# Patient Record
Sex: Female | Born: 1955 | Race: White | Hispanic: No | Marital: Married | Smoking: Never smoker
Health system: Southern US, Community
[De-identification: ages and names within clinical notes are randomized; demographics above are authoritative.]

## PROBLEM LIST (undated history)

## (undated) DIAGNOSIS — K219 Gastro-esophageal reflux disease without esophagitis: Secondary | ICD-10-CM

## (undated) DIAGNOSIS — R590 Localized enlarged lymph nodes: Secondary | ICD-10-CM

## (undated) DIAGNOSIS — G2581 Restless legs syndrome: Secondary | ICD-10-CM

## (undated) DIAGNOSIS — I10 Essential (primary) hypertension: Secondary | ICD-10-CM

## (undated) DIAGNOSIS — K449 Diaphragmatic hernia without obstruction or gangrene: Secondary | ICD-10-CM

## (undated) DIAGNOSIS — D649 Anemia, unspecified: Secondary | ICD-10-CM

## (undated) DIAGNOSIS — R51 Headache: Secondary | ICD-10-CM

## (undated) DIAGNOSIS — M1990 Unspecified osteoarthritis, unspecified site: Secondary | ICD-10-CM

## (undated) DIAGNOSIS — G4730 Sleep apnea, unspecified: Secondary | ICD-10-CM

## (undated) DIAGNOSIS — E785 Hyperlipidemia, unspecified: Secondary | ICD-10-CM

## (undated) HISTORY — PX: TUMOR REMOVAL: SHX12

## (undated) HISTORY — DX: Unspecified osteoarthritis, unspecified site: M19.90

## (undated) HISTORY — PX: TONSILLECTOMY: SUR1361

## (undated) HISTORY — DX: Essential (primary) hypertension: I10

## (undated) HISTORY — DX: Diaphragmatic hernia without obstruction or gangrene: K44.9

## (undated) HISTORY — PX: ENDOMETRIAL BIOPSY: SHX622

## (undated) HISTORY — DX: Hyperlipidemia, unspecified: E78.5

## (undated) HISTORY — DX: Restless legs syndrome: G25.81

## (undated) HISTORY — PX: FINGER SURGERY: SHX640

## (undated) HISTORY — DX: Sleep apnea, unspecified: G47.30

---

## 1998-01-03 ENCOUNTER — Other Ambulatory Visit: Admission: RE | Admit: 1998-01-03 | Discharge: 1998-01-03 | Payer: Self-pay | Admitting: Obstetrics and Gynecology

## 1998-05-18 ENCOUNTER — Other Ambulatory Visit: Admission: RE | Admit: 1998-05-18 | Discharge: 1998-05-18 | Payer: Self-pay | Admitting: Obstetrics and Gynecology

## 1998-08-11 ENCOUNTER — Other Ambulatory Visit: Admission: RE | Admit: 1998-08-11 | Discharge: 1998-08-11 | Payer: Self-pay | Admitting: Obstetrics and Gynecology

## 1999-02-23 ENCOUNTER — Other Ambulatory Visit: Admission: RE | Admit: 1999-02-23 | Discharge: 1999-02-23 | Payer: Self-pay | Admitting: Obstetrics and Gynecology

## 2000-01-03 ENCOUNTER — Encounter (INDEPENDENT_AMBULATORY_CARE_PROVIDER_SITE_OTHER): Payer: Self-pay

## 2000-01-03 ENCOUNTER — Other Ambulatory Visit: Admission: RE | Admit: 2000-01-03 | Discharge: 2000-01-03 | Payer: Self-pay | Admitting: Obstetrics and Gynecology

## 2000-02-21 ENCOUNTER — Other Ambulatory Visit: Admission: RE | Admit: 2000-02-21 | Discharge: 2000-02-21 | Payer: Self-pay | Admitting: Obstetrics and Gynecology

## 2001-02-07 ENCOUNTER — Encounter: Payer: Self-pay | Admitting: Obstetrics and Gynecology

## 2001-02-07 ENCOUNTER — Encounter: Admission: RE | Admit: 2001-02-07 | Discharge: 2001-02-07 | Payer: Self-pay | Admitting: Obstetrics and Gynecology

## 2001-02-21 ENCOUNTER — Other Ambulatory Visit: Admission: RE | Admit: 2001-02-21 | Discharge: 2001-02-21 | Payer: Self-pay | Admitting: Obstetrics and Gynecology

## 2002-03-25 ENCOUNTER — Other Ambulatory Visit: Admission: RE | Admit: 2002-03-25 | Discharge: 2002-03-25 | Payer: Self-pay | Admitting: Obstetrics and Gynecology

## 2002-07-31 ENCOUNTER — Encounter: Admission: RE | Admit: 2002-07-31 | Discharge: 2002-07-31 | Payer: Self-pay | Admitting: Obstetrics and Gynecology

## 2002-07-31 ENCOUNTER — Encounter: Payer: Self-pay | Admitting: Obstetrics and Gynecology

## 2003-08-02 ENCOUNTER — Encounter: Admission: RE | Admit: 2003-08-02 | Discharge: 2003-08-02 | Payer: Self-pay | Admitting: Obstetrics and Gynecology

## 2003-08-02 ENCOUNTER — Encounter: Payer: Self-pay | Admitting: Obstetrics and Gynecology

## 2003-09-21 ENCOUNTER — Encounter: Payer: Self-pay | Admitting: Obstetrics and Gynecology

## 2003-09-22 ENCOUNTER — Ambulatory Visit (HOSPITAL_COMMUNITY): Admission: RE | Admit: 2003-09-22 | Discharge: 2003-09-22 | Payer: Self-pay | Admitting: Obstetrics and Gynecology

## 2003-09-22 ENCOUNTER — Encounter (INDEPENDENT_AMBULATORY_CARE_PROVIDER_SITE_OTHER): Payer: Self-pay | Admitting: Specialist

## 2004-06-20 ENCOUNTER — Emergency Department (HOSPITAL_COMMUNITY): Admission: EM | Admit: 2004-06-20 | Discharge: 2004-06-20 | Payer: Self-pay | Admitting: Emergency Medicine

## 2004-08-18 ENCOUNTER — Ambulatory Visit (HOSPITAL_COMMUNITY): Admission: RE | Admit: 2004-08-18 | Discharge: 2004-08-18 | Payer: Self-pay | Admitting: Obstetrics and Gynecology

## 2005-12-11 ENCOUNTER — Ambulatory Visit (HOSPITAL_COMMUNITY): Admission: RE | Admit: 2005-12-11 | Discharge: 2005-12-11 | Payer: Self-pay | Admitting: Obstetrics and Gynecology

## 2007-01-30 ENCOUNTER — Ambulatory Visit (HOSPITAL_COMMUNITY): Admission: RE | Admit: 2007-01-30 | Discharge: 2007-01-30 | Payer: Self-pay | Admitting: Obstetrics and Gynecology

## 2008-02-12 ENCOUNTER — Ambulatory Visit (HOSPITAL_COMMUNITY): Admission: RE | Admit: 2008-02-12 | Discharge: 2008-02-12 | Payer: Self-pay | Admitting: Obstetrics and Gynecology

## 2008-07-08 ENCOUNTER — Ambulatory Visit (HOSPITAL_BASED_OUTPATIENT_CLINIC_OR_DEPARTMENT_OTHER): Admission: RE | Admit: 2008-07-08 | Discharge: 2008-07-08 | Payer: Self-pay | Admitting: Orthopedic Surgery

## 2008-09-07 ENCOUNTER — Ambulatory Visit (HOSPITAL_BASED_OUTPATIENT_CLINIC_OR_DEPARTMENT_OTHER): Admission: RE | Admit: 2008-09-07 | Discharge: 2008-09-07 | Payer: Self-pay | Admitting: Orthopedic Surgery

## 2009-02-21 ENCOUNTER — Encounter: Admission: RE | Admit: 2009-02-21 | Discharge: 2009-02-21 | Payer: Self-pay | Admitting: Family Medicine

## 2009-03-11 ENCOUNTER — Encounter (INDEPENDENT_AMBULATORY_CARE_PROVIDER_SITE_OTHER): Payer: Self-pay | Admitting: Gastroenterology

## 2009-03-11 ENCOUNTER — Ambulatory Visit (HOSPITAL_COMMUNITY): Admission: RE | Admit: 2009-03-11 | Discharge: 2009-03-11 | Payer: Self-pay | Admitting: Gastroenterology

## 2009-03-18 ENCOUNTER — Ambulatory Visit (HOSPITAL_COMMUNITY): Admission: RE | Admit: 2009-03-18 | Discharge: 2009-03-18 | Payer: Self-pay | Admitting: Obstetrics and Gynecology

## 2009-03-22 ENCOUNTER — Encounter (INDEPENDENT_AMBULATORY_CARE_PROVIDER_SITE_OTHER): Payer: Self-pay | Admitting: Gastroenterology

## 2009-03-22 ENCOUNTER — Ambulatory Visit (HOSPITAL_COMMUNITY): Admission: RE | Admit: 2009-03-22 | Discharge: 2009-03-22 | Payer: Self-pay | Admitting: Gastroenterology

## 2009-03-25 ENCOUNTER — Encounter: Admission: RE | Admit: 2009-03-25 | Discharge: 2009-03-25 | Payer: Self-pay | Admitting: Obstetrics and Gynecology

## 2009-04-29 ENCOUNTER — Inpatient Hospital Stay (HOSPITAL_COMMUNITY): Admission: RE | Admit: 2009-04-29 | Discharge: 2009-05-03 | Payer: Self-pay | Admitting: General Surgery

## 2009-04-29 ENCOUNTER — Encounter (INDEPENDENT_AMBULATORY_CARE_PROVIDER_SITE_OTHER): Payer: Self-pay | Admitting: General Surgery

## 2010-04-03 ENCOUNTER — Ambulatory Visit (HOSPITAL_COMMUNITY): Admission: RE | Admit: 2010-04-03 | Discharge: 2010-04-03 | Payer: Self-pay | Admitting: Obstetrics and Gynecology

## 2010-12-24 ENCOUNTER — Encounter: Payer: Self-pay | Admitting: Obstetrics and Gynecology

## 2011-02-19 ENCOUNTER — Institutional Professional Consult (permissible substitution): Payer: Self-pay | Admitting: Cardiology

## 2011-03-06 ENCOUNTER — Institutional Professional Consult (permissible substitution): Payer: Self-pay | Admitting: Cardiology

## 2011-03-13 LAB — CBC
HCT: 34.6 % — ABNORMAL LOW (ref 36.0–46.0)
HCT: 39.3 % (ref 36.0–46.0)
Hemoglobin: 11.5 g/dL — ABNORMAL LOW (ref 12.0–15.0)
Hemoglobin: 13.2 g/dL (ref 12.0–15.0)
MCHC: 33.3 g/dL (ref 30.0–36.0)
MCHC: 33.6 g/dL (ref 30.0–36.0)
MCV: 86.8 fL (ref 78.0–100.0)
MCV: 87.8 fL (ref 78.0–100.0)
Platelets: 269 10*3/uL (ref 150–400)
Platelets: 288 10*3/uL (ref 150–400)
RBC: 3.94 MIL/uL (ref 3.87–5.11)
RBC: 4.52 MIL/uL (ref 3.87–5.11)
RDW: 12.7 % (ref 11.5–15.5)
RDW: 13.4 % (ref 11.5–15.5)
WBC: 10.8 10*3/uL — ABNORMAL HIGH (ref 4.0–10.5)
WBC: 7.9 10*3/uL (ref 4.0–10.5)

## 2011-03-13 LAB — BASIC METABOLIC PANEL
BUN: 5 mg/dL — ABNORMAL LOW (ref 6–23)
CO2: 30 mEq/L (ref 19–32)
Calcium: 8.7 mg/dL (ref 8.4–10.5)
Chloride: 105 mEq/L (ref 96–112)
Creatinine, Ser: 0.54 mg/dL (ref 0.4–1.2)
GFR calc Af Amer: >60 mL/min (ref >60)
GFR calc non Af Amer: >60 mL/min (ref >60)
Glucose, Bld: 133 mg/dL — ABNORMAL HIGH (ref 70–99)
Potassium: 4.2 mEq/L (ref 3.5–5.1)
Sodium: 140 mEq/L (ref 135–145)

## 2011-03-13 LAB — DIFFERENTIAL
Basophils Absolute: 0 10*3/uL (ref 0.0–0.1)
Basophils Relative: 0 % (ref 0–1)
Eosinophils Absolute: 0.3 10*3/uL (ref 0.0–0.7)
Eosinophils Relative: 4 % (ref 0–5)
Lymphocytes Relative: 28 % (ref 12–46)
Lymphs Abs: 2.2 10*3/uL (ref 0.7–4.0)
Monocytes Absolute: 0.6 10*3/uL (ref 0.1–1.0)
Monocytes Relative: 8 % (ref 3–12)
Neutro Abs: 4.7 10*3/uL (ref 1.7–7.7)
Neutrophils Relative %: 60 % (ref 43–77)

## 2011-03-13 LAB — COMPREHENSIVE METABOLIC PANEL
ALT: 63 U/L — ABNORMAL HIGH (ref 0–35)
AST: 47 U/L — ABNORMAL HIGH (ref 0–37)
Albumin: 3.9 g/dL (ref 3.5–5.2)
Alkaline Phosphatase: 118 U/L — ABNORMAL HIGH (ref 39–117)
BUN: 10 mg/dL (ref 6–23)
CO2: 30 mEq/L (ref 19–32)
Calcium: 9.7 mg/dL (ref 8.4–10.5)
Chloride: 103 mEq/L (ref 96–112)
Creatinine, Ser: 0.71 mg/dL (ref 0.4–1.2)
GFR calc Af Amer: >60 mL/min (ref >60)
GFR calc non Af Amer: >60 mL/min (ref >60)
Glucose, Bld: 143 mg/dL — ABNORMAL HIGH (ref 70–99)
Potassium: 4.3 mEq/L (ref 3.5–5.1)
Sodium: 141 mEq/L (ref 135–145)
Total Bilirubin: 0.7 mg/dL (ref 0.3–1.2)
Total Protein: 6.8 g/dL (ref 6.0–8.3)

## 2011-03-13 LAB — TYPE AND SCREEN
ABO/RH(D): B POS
Antibody Screen: NEGATIVE

## 2011-03-13 LAB — ABO/RH: ABO/RH(D): B POS

## 2011-04-17 NOTE — Op Note (Signed)
NAME:  Alison [name], Alison [name]                ACCOUNT NO.:  [id number]   MEDICAL RECORD NO.:  [id number]          PATIENT TYPE:  AMB   LOCATION:  DSC                          FACILITY:  [facility]   PHYSICIAN:  Katy Fitch. Sypher, M.D. DATE OF BIRTH:  28-Jan-1956   DATE OF PROCEDURE:  09/07/2008  DATE OF DISCHARGE:                               OPERATIVE REPORT   PREOPERATIVE DIAGNOSES:  Complex post-traumatic dystrophy, right small  finger, status post small finger proximal phalanx fracture sustained,  July 04, 2008, status post closed reduction percutaneous Kirschner wire  fixation utilizing intramedullary Kirschner wires, complicated by  regional pain syndrome and severe Peritendinous adhesions between the  extensor mechanism of the proximal and middle phalanges and the flexor  mechanism and the volar aspect of the metacarpal and proximal phalanges,  also significant right small finger proximal interphalangeal capsular  contracture.   OPERATION:  1. Tenolysis of extensor mechanism from metacarpophalangeal joint      distally to distal interphalangeal joint.  2. Tenolysis of flexor digitorum profundus and superficialis tendons,      right small finger over distance from lumbrical origin distally to      C3 pulley.  3. Proximal interphalangeal capsulectomy, right small finger.   OPERATING SURGEON:  Katy Fitch. Sypher, MD   ASSISTANT:  Marveen Reeks Dasnoit, PA-C   ANESTHESIA:  Right arm IV regional and proximal brachial level  supplemented by 0.25% Marcaine digital block at metacarpal head level.   SUPERVISING ANESTHESIOLOGIST:  Janetta Hora. 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   She was subsequently referred for Hand Surgery consult in early August.  At that time, she was advised of the complexity of what seems to be a  simple metaphyseal fracture of  proximal phalanx.  Given the interplay  between the extrinsic flexor and extensor tendons and the intrinsic  musculature, these fractures are quite prone to severe angulation,  shortening, and the development of a dystrophic finger.   We advised that in our judgment the best means of managing this was  closed reduction and intramedullary Kirschner wire fixation in an effort  to obtain and maintain an anatomic reduction of the proximal phalanx,  followed by tenolysis of the extensor and flexor tendon at a later date.   There was a small chance that she would be able to avoid a secondary  procedure, although it is out typical experience that these become  extremely stiff.   She was taken to the operating room on July 08, 2008 and underwent  closed reduction and percutaneous fixation with wedged Kirschner wires  that did not pass through the metacarpal.  We were able to maintain a  reduction for 3 weeks and removed the pins.  During her period of  postoperative care, she was able to elevate her hand and was able to  perform some passive exercise.   Shortly after the pins removed, she began to experience syndrome with  swelling and pericapsular  rubor of the PIP and DIP joints consistent  with a dystrophic finger.   For reasons that remain unexplained, this is a common occurrence with  this fracture.   She was started in vigorous therapy program under the supervision of  Wells Guiles OTR/CHT and has been performing exercises  appropriately.  She has regained full flexor range of motion of her  index, long, and ring fingers, but has had complete stiffness of the PIP  and DIP joint of the small finger in extension and only 0-45 degrees of  active motion of the small finger MP joint.   Once fracture healing was assured, we recommended she proceed with  tenolysis.  She is brought to the operating room at this time.   PROCEDURE:  [name] is brought to the operating room and  placed in  supine position on the operating table.   Following an Anesthesia consult with Dr. Gelene Mink preoperatively, IV  regional block at proximal brachial level was recommended and accepted.  We also advised that we anticipated procedure with a digital block once  the tenolysis and capsulectomy was completed so that we may demonstrate  active motion to Ms. Durrett in the operating room.  She did bring her  glasses to the OR so that she would be able to visualize her hand.   She was brought to room 2 of the [facility], placed in supine  position on the operating table, and under Dr. Thornton Dales supervision,  an IV regional block placed without complication.  Within 10 minutes, we  had excellent anesthesia.   The right arm was prepped with Betadine soap solution and sterilely  draped.  The procedure commenced with 2 dorsal mid lateral incisions  near the PIP joint which allowed exposure of the extensor mechanism.  The transverse retinacular fibers were identified and released.  The  lateral bands and central slip were completely tenolysed off the dorsum  of the proximal phalanx with a Glorious Peach and specialized tenolysis knives  designed by Dr. Eustaquio Boyden.  Complete tenolysis of the extensor was  accomplished.  The PIP capsule was then exposed dorsal to the collateral  ligaments and the capsule resected.  Thereafter, passive flexion of the  PIP joint to 100 degrees was accomplished.  The distal lateral bands  were then tenolysed with an Engineering geologist Freer-type tool and DIP motion  0 to 30 was achieved.  A formal capsulotomy of the DIP joint was not  undertaken.   We then explored the flexor side through a transverse incision in the  distal palmar crease.  Ms. Jolley had already begun forming significant  palmar fibromatosis causing pitting of the skin and marked tethering of  the A1 pulley to the pretendinous fibers.  These pathologic areas of  fibromatosis were resected followed by  windowing of the flexor sheath  proximal to the A1 pulley.  With great care using scissors, a [employer] tenolysis knives, and various size Freers, we are able to lyse the  flexor tendons of adhesions between the superficialis decussation and  the profundus tendon as well as the adhesions between the proximal  phalanx and the dorsal aspect of the profundus tendon.  The tendon was  also noted to be adherent to the flexor retinaculum proximal to the A1  pulley extending over the metacarpal shaft.   This may be consequent to bleeding in the flexor sheath following the  initial fracture experienced.   With a Allis clamp, a traction tenolysis was  performed with immediate  recovery of passive motion of the MP joint 0-85, the PIP joint 0-100,  and the PIP joint 0-30.   Ms. Heminger was then awakened from sedation and was asked to flex her  fingers.  With the wrist in 40 degrees extension, was able to flexor  fingertip to within 1 cm of the distal palmar crease.  She could  actively flex the PIP joint 95 degrees and the PIP joint 30 degrees.   Her wounds were repaired with intradermal 3-0 Prolene in the palmar  incision and intradermal 4-0 Prolene dorsally.  Steri-Strips were  applied.  After hemostasis was achieved by direct compression, she was  placed in a sterile hand dressing with a volar plaster splint  maintaining the wrist in 40 degrees dorsiflexion and the MP and PIP  joint free.  She was able to demonstrate continued active range of  motion in her dressing.  The dressing was finished with an Ace wrap and  Coban.   There were no apparent complications.   For aftercare, Ms. Hogans is provided Percocet 5 mg 1 p.o. q.4-6 h p.r.n.  pain, 30 tablets without refill.  Also, Keflex 500 mg once p.o. q.8 h x4  days as a prophylactic antibiotic.   She will be discharged to the care of her husband with a request to  return to our office for followup in 24 hours to initiate a therapy   program.      Katy Fitch. Sypher, M.D.  Electronically Signed     RVS/MEDQ  D:  09/07/2008  T:  09/08/2008  Job:  [id number]

## 2011-04-17 NOTE — Op Note (Signed)
NAME:  KOLEEN, CELIA NO.:  [id number]   MEDICAL RECORD NO.:  [id number]          PATIENT TYPE:  INP   LOCATION:  0003                         [facility]AC[facility]   PHYSICIAN:  Juanetta Gosling, MDDATE OF BIRTH:  1956-07-12   DATE OF PROCEDURE:  04/29/2009  DATE OF DISCHARGE:                               OPERATIVE REPORT   PREOPERATIVE DIAGNOSIS:  Gastric mass.   POSTOPERATIVE DIAGNOSIS:  Gastric mass.   PROCEDURE:  1. Laparoscopic gastric wedge resection.  2. Esophagogastroduodenoscopy.   SURGEON:  Troy Sine. Dwain Sarna, M.D.   ASSISTANT:  Ollen Gross. Vernell Morgans, M.D.   ANESTHESIA:  General.   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  Jimmy Picket   SPECIMENS:  Gastric mass to pathology.   DRAINS:  A 19-French Blake drain to staple line.   ESTIMATED BLOOD LOSS:  Minimal.   COMPLICATIONS:  None.   DISPOSITION:  To recovery room in stable condition.   INDICATIONS:  This is a 55 year old female who was evaluated for  atypical chest pain by Dr. Kipp Brood Burnett's office.  She had a normal EKG  and underwent a CT angio for a pulmonary embolism that was negative.  Incidentally, this was noted to be a 2.5 mm x 2.5 cm exophytic mass in  the stomach. The patient was referred to Dr. Jeani Hawking for  evaluation.  She underwent an EGD and a [place], that showed a 2.5 cm x 2.5  cm fundic submucosal mass.  The biopsy results showed what looked like  to be spindle cell proliferation, consistent with a gastrointestinal  stromal tumor.  She is otherwise healthy, and on her exam was benign.  I  counseled her for a laparoscopic and possible open gastric wedge  resection or a gastrectomy, depending upon where this area was.   DESCRIPTION OF PROCEDURE:  After an informed consent was obtained, the  patient was taken to the operating room.  She was administered 1 gram of  intravenous cefazolin.  Sequential compression devices were placed on  her  lower extremities.  She underwent general endotracheal anesthesia  without complication.  She was on a split leg table for access during  the procedure.  Her abdomen was then prepped and draped in the standard  sterile surgical fashion.  A surgical time-out was then performed.   A local anesthetic was infiltrated above her umbilicus with 1% lidocaine  mixed with 0.25% Marcaine. A 10-mm vertical incision was then made above  her umbilicus and dissection was carried out down to the level of her  fascia.  This was incised sharply.  A #0 Vicryl pursestring suture was  then placed through her fascia and a Hassan trocar was introduced into  the abdomen.  Her abdomen was then insufflated to 15 mmHg pressure.  She  was then placed in the steep reverse Trendelenburg position.  Two  further trocars were placed in the midclavicular line.  They were 5 mm  in size.  A further 5 mm trocar was placed after infiltration with  local anesthetic in the left anterior axillary  line.  An incision was  also made in the epigastrium and then a liver retractor was then  inserted.  Under direct vision the liver was then retracted without  difficulty.  She was noted to have about a 2 cm mass on the greater  curvature of her stomach, in the region of her fundus.  I used the  harmonic scalpel to release her short gastric arteries and to mobilize  the greater curvature of her stomach, up towards her spleen.  There was  a small amount of bleeding that was present, that was controlled with  clips and the harmonic scalpel.  I did place a Ray-Tec sponge in there  during the procedure as well, but all this was hemostatic.  Once I had  the area with the mass rotated up, I performed an EGD to identify  intraluminally where this mass was.  There was a smaller component of  this actually inside the stomach and it was exactly where it appeared to  be on the outside, as well.  It was at least 4 cm from the GE junction.  I removed  the EGD. I then placed a 40-French bougie in through the GE  junction, into the stomach, to ensure that we did not damage this during  the procedure.  I then used an Echelon stapler with blue loads to divide  the stomach, as well as the tumor that was present.  This took three  fires of an Energy manager.  The staple line was hemostatic upon  completion.  The stomach and tumor was then placed in an EndoCatch bag  and removed out of the umbilicus.  This was sent to pathology for a  frozen section, where this was noted to have margins negative, as well  as appeared to be on frozen, consistent with a gastrointestinal stromal  tumor.  Following this I then removed the Ray-Tec sponge.  Hemostasis  was observed at this point.  I did place two clips on the staple line of  the stomach, but otherwise this appeared very good.  I then removed the  bougie and placed the EGD again and tested this under water, with no  evidence of any leak.  I did place Tisseel over the staple line, as well  as placing a 19-French Blake drain through one of the laparoscopic ports  next to this as well.  Following this, I used the #0 Vicryl and  Endoclose, to close the 10/12 port, that I had up-sized to place the  stapler through.  I then secured the drain and then put an additional  pursestring suture into the umbilical incision and then closed this.  The remainder of the 5 mm trocars were removed, after the abdomen was  desufflated, as well as the Nathanson retractor under direct vision.  All the incisions were then closed with #4-0 Monocryl and Dermabond.   She tolerated this well and was extubated in the operating room and  transferred to the recovery room in stable condition.      Juanetta Gosling, MD  Electronically Signed     MCW/MEDQ  D:  04/29/2009  T:  04/29/2009  Job:  [id number]   cc:   Jordan Hawks. Elnoria Howard, MD  Fax: [phone]   Marjory Lies, M.D.  Fax: [phone]

## 2011-04-17 NOTE — Op Note (Signed)
NAME:  [name], [name]                ACCOUNT NO.:  [id number]   MEDICAL RECORD NO.:  [id number]          PATIENT TYPE:  AMB   LOCATION:  DSC                          FACILITY:  [facility]   PHYSICIAN:  Katy Fitch. Sypher, M.D. DATE OF BIRTH:  04-19-1956   DATE OF PROCEDURE:  07/08/2008  DATE OF DISCHARGE:                               OPERATIVE REPORT   PREOPERATIVE DIAGNOSIS:  Comminuted fracture, right small finger  proximal phalanx, proximal metaphysis with angulation.   POSTOPERATIVE DIAGNOSIS:  Comminuted fracture, right small finger  proximal phalanx, proximal metaphysis with angulation.   OPERATIONS:  Closed reduction and percutaneous intramedullary Kirschner  wire fixation of right small finger proximal phalanx x2.   OPERATING SURGEON:  Katy Fitch. Sypher, MD.   ASSISTANT:  Marveen Reeks Dasnoit, PA   ANESTHESIA:  General by LMA.   SUPERVISING ANESTHESIOLOGIST:  Quita Skye. 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   Clinical examination revealed ecchymosis and minor abrasions from the  fall creating the fracture.   Preoperative x-rays revealed comminution of her proximal metaphysis.   We had a lengthy informed consent, pointing out that this fracture could  be extremely challenging with stiffness and shortening of the proximal  phalanx.  We advised her to proceed with closed reduction and  percutaneous pinning.  She understands the chance we may need to perform  tenolysis at a later date.   Preoperatively, questions were invited and answered in detail for Ms.  Hair.   PROCEDURE:  [name] is brought to operating room and placed in  supine position on the operating table.   Following induction of general anesthesia by LMA technique, the right  arm was prepped with Betadine soap solution and sterilely draped.  Following  exsanguination of right arm with Esmarch bandage, arterial  tourniquet was inflated to 230 mmHg.  Procedure commenced with closed  reduction of the fracture with traction and a 90:90 position of the MP  and PIP joints.  A 0.035-inch Kirschner wire was placed into the ulnar  base of the proximal phalanx with hand technique and driven with a  Kirschner wire driver down the intramedullary canal.   An anatomic alignment of the fracture was obtained.   A second Kirschner wire was then threaded to the base of the radial  condyle of the proximal phalanx by hand and subsequently advanced with a  K-wire driver to the distal metaphysis.   AP, lateral, and multiple oblique C-arm images were obtained confirming  the intramedullary position of the Kirschner wires.  We are able to  maintain the length of the fracture.   By driving the wires distally into the diaphyseal cortex, we may be able  to obtain enough purchase to prevent collapse.   The pins were bent in the usual manner, dressed with Xeroflo, sterile  gauze, and a ulnar gutter splint.  There were no apparent complications.   For aftercare, [name] is advised to elevate her  hand for next the 7  days.  She will work on thumb, index, and long finger motion.  We will  see her in the office in 7 days for check x-ray and advancement to a  thermoplastic splint.      Katy Fitch Sypher, M.D.  Electronically Signed     RVS/MEDQ  D:  07/08/2008  T:  07/09/2008  Job:  [id number]

## 2011-04-20 NOTE — Op Note (Signed)
NAME:  Alison, [name] NO.:  [id number]   MEDICAL RECORD NO.:  [id number]                   PATIENT TYPE:  AMB   LOCATION:  DAY                                  FACILITY:  [facility]   PHYSICIAN:  Malachi Pro. Ambrose Mantle, M.D.              DATE OF BIRTH:  21-Sep-1956   DATE OF PROCEDURE:  09/22/2003  DATE OF DISCHARGE:                                 OPERATIVE REPORT   PREOPERATIVE DIAGNOSIS:  Abnormal uterine bleeding.   POSTOPERATIVE DIAGNOSIS:  Abnormal uterine bleeding with a large endometrial  polyp.   OPERATION:  Dilatation and curettage, hysteroscopy, removal of endometrial  polyp.   OPERATOR:  Malachi Pro. Ambrose Mantle, M.D.   General anesthesia.   The patient was brought to the operating room and placed under satisfactory  general anesthesia and placed in the [facility] stirrups and placed in lithotomy  position.  Exam revealed the uterus to be midplane, normal size, the adnexa  were free of masses.  The cervix was drawn into the operative field after  the area was prepped with Betadine solution and draped as a sterile field.  The uterus sounded to 8 cm in the midplane.  The cervical canal was then  dilated progressively with Shawnie Pons dilators until it easily accepted the  hysteroscopic trocar and sleeve.  In visualizing the endometrial cavity, it  was apparent that she had a symmetrical golf ball-shaped polyp at the top of  the fundus.  It was difficult to tell whether it was a polyp or a fibroid,  so I removed the hysteroscope and used the polyp forceps to extract fairly  significantly sized pieces of tissue.  Re-look with the hysteroscope, still  there was some polyp remaining.  I resected more with the polyp forceps and  the uterine dressing forceps, looked again.  There was only a small amount  of tissue remaining.  I removed this with the biopsy forceps.  I then did a  D&C, curetting the endometrial cavity and the endocervical canal, and the  procedure was  terminated.  There were no other abnormalities noted except  the large endometrial polyp.  Blood loss was less than 10 mL.  The patient  was returned to recovery in satisfactory condition.  There was 3000 mL of  sorbitol to start with.  There was 1000 left in the bag at the end of the  procedure, 70 mL were necessary to prime the pump.  We collected 1500 mL in  the suction, there was approximately 100 mL in a basin, and then there was  an undecipherable amount on the floor.  At worst, the patient had a deficit  of 400 mL, which she should manage without any problem.  Malachi Pro. Ambrose Mantle, M.D.    TFH/MEDQ  D:  09/22/2003  T:  09/22/2003  Job:  [id number]

## 2011-04-20 NOTE — Discharge Summary (Signed)
NAME:  Alison [name], Alison [name] NO.:  [id number]   MEDICAL RECORD NO.:  [id number]          PATIENT TYPE:  INP   LOCATION:  1340                         FACILITY:  [facility]   PHYSICIAN:  Juanetta Gosling, MDDATE OF BIRTH:  Nov 09, 1956   DATE OF ADMISSION:  04/29/2009  DATE OF DISCHARGE:  05/03/2009                               DISCHARGE SUMMARY   ADMISSION DIAGNOSES:  1. Gastric mass.  2. Hypertension.  3. Restless leg syndrome.   DISCHARGE DIAGNOSES:  1. Status post laparoscopic excision of a fundic mass.  2. Hypertension.  3. Restless leg syndrome.   PROCEDURES PERFORMED:  Apr 29, 2009:  A laparoscopic gastric wedge  resection and esophagogastroduodenoscopy.   MEDICATIONS UPON DISCHARGE:  1. Benicar 25 mg 1/2 tab every p.m.  2. Clonazepam 0.5 mg 1/2 to 1 every p.m.  3. Astelin nasal spray.  4. Multivitamin daily.  5. Percocet 1-2 tablets p.o. q.4 h. p.r.n.  6. Phenergan 25 mg p.o. q.6 h. p.r.n.   DIET:  Soft diet for 1 week.   FOLLOWUP:  One week with Dr. Dwain Sarna of [facility].   DISCHARGE INSTRUCTIONS:  Increase her activity slowly.  No heavy lifting  over 10 pounds for 4 weeks.  She was to return to work in 2-3 weeks, to  call if she had a temperature over 100.5, inability to tolerate food or  drink, drainage from her wounds or any other questions.   HISTORY:  [name] who was evaluated for  atypical chest pain by Dr. Kipp Brood Burnett's office.  She had a normal EKG  and underwent evaluation for pulmonary embolism that was negative.  Incidentally on the scan, they noticed about 2.5 x 2.5 cm exophytic mass  in the stomach, and she was referred to Dr. Elnoria Howard for evaluation.  She  underwent an EGD and EUS that showed a large 2.5 x 2.5 fundic mucosal  mass that appeared to be a gastrointestinal stromal tumor.  This was  biopsied and showed spindle and epithelioid cell proliferation.  On her  evaluation, she had normal  examination.  She and I discussed a  laparoscopic possible open gastric wedge resection with an EGD at the  same time.  On Apr 29, 2009, she was taken to the operating room where  she underwent a laparoscopic gastric wedge resection as well as an  esophagogastroduodenoscopy.  Frozen section was consistent with a  gastrointestinal stromal tumor.  Margins were negative.  A 19-French  Blake drain was placed near her staple line.  On postoperative day 1,  her Foley catheter was removed.  Her laboratory evaluation remained  fairly normal throughout her hospital stay.  Her hematocrit was 34.6  immediately postoperatively.  She had some minimal nausea the first 24  hours.  On postoperative day 2, she was tolerating clears very well, and  her Harrison Mons drain was putting out serous drainage.  She began to ambulate  and was mobilizing very well.  By postoperative day 4, she was  tolerating a soft diet, had begun to pass gas and had return of  bowel  function at that point.  I removed her Harrison Mons drain prior to her  discharge.  Her incisions all remained clean without any evidence of any  infection upon discharge as well.      Juanetta Gosling, MD  Electronically Signed     MCW/MEDQ  D:  05/16/2009  T:  05/16/2009  Job:  [id number]   cc:   Jordan Hawks. Elnoria Howard, MD  Fax: [phone]

## 2011-04-23 ENCOUNTER — Encounter: Payer: Self-pay | Admitting: Cardiology

## 2011-04-23 DIAGNOSIS — I10 Essential (primary) hypertension: Secondary | ICD-10-CM | POA: Insufficient documentation

## 2011-04-23 DIAGNOSIS — G2581 Restless legs syndrome: Secondary | ICD-10-CM | POA: Insufficient documentation

## 2011-04-23 DIAGNOSIS — E785 Hyperlipidemia, unspecified: Secondary | ICD-10-CM | POA: Insufficient documentation

## 2011-04-25 ENCOUNTER — Encounter: Payer: Self-pay | Admitting: Cardiology

## 2011-04-25 ENCOUNTER — Ambulatory Visit (INDEPENDENT_AMBULATORY_CARE_PROVIDER_SITE_OTHER): Payer: BC Managed Care – PPO | Admitting: Cardiology

## 2011-04-25 VITALS — BP 114/84 | HR 84 | Ht 66.0 in | Wt 161.2 lb

## 2011-04-25 DIAGNOSIS — Z9189 Other specified personal risk factors, not elsewhere classified: Secondary | ICD-10-CM

## 2011-04-25 DIAGNOSIS — N809 Endometriosis, unspecified: Secondary | ICD-10-CM | POA: Insufficient documentation

## 2011-04-25 DIAGNOSIS — E785 Hyperlipidemia, unspecified: Secondary | ICD-10-CM

## 2011-04-25 DIAGNOSIS — Z789 Other specified health status: Secondary | ICD-10-CM

## 2011-04-25 DIAGNOSIS — I10 Essential (primary) hypertension: Secondary | ICD-10-CM

## 2011-04-25 NOTE — Progress Notes (Signed)
   Alison [name] Date of Birth: 10-Apr-1956   History of Present Illness: Alison [name] is seen at the request of Dr. Doristine Counter for evaluation of her cardiovascular risk. She does have a history of hypertension and hyperlipidemia. These have been treated appropriately. She reports that she has good control. She does have a family history of coronary disease with her father having a myocardial infarction at age 56.she denies any prior cardiac symptoms. She specifically denies any shortness of breath, chest pain, fatigue, or palpitations. She admits that she doesn't exercise much.  Current Outpatient Prescriptions on File Prior to Visit  Medication Sig Dispense Refill  . clonazePAM (KLONOPIN) 0.5 MG tablet Take 0.25 mg by mouth daily.       . fish oil-omega-3 fatty acids 1000 MG capsule Take by mouth daily.        Marland Kitchen lovastatin (MEVACOR) 40 MG tablet Take 20 mg by mouth at bedtime.       . valsartan-hydrochlorothiazide (DIOVAN-HCT) 320-25 MG per tablet Take 1 tablet by mouth daily. 1.5 tablets      . azelastine (ASTELIN) 137 MCG/SPRAY nasal spray 1 spray by Nasal route 2 (two) times daily. Use in each nostril as directed       . DISCONTD: celecoxib (CELEBREX) 100 MG capsule Take 100 mg by mouth 2 (two) times daily.        Marland Kitchen DISCONTD: predniSONE (DELTASONE) 20 MG tablet Take 20 mg by mouth 2 (two) times daily.          No Known Allergies  Past Medical History  Diagnosis Date  . Chest pain   . Hypertension   . Hyperlipidemia   . RLS (restless legs syndrome)   . Endometriosis     Past Surgical History  Procedure Date  . Tonsillectomy   . Cesarean section   . Finger surgery     x2  . Tumor removal     gastric schwannoma  . Endometrial biopsy     History  Smoking status  . Never Smoker   Smokeless tobacco  . Not on file    History  Alcohol Use  . Yes    Family History  Problem Relation Age of Onset  . Asthma    . Cancer    . Diabetes    . Heart disease    . Hypertension     . Heart attack Father   . Hypertension Father     Review of Systems: All other systems were reviewed and are negative.  Physical Exam: BP 114/84  Pulse 84  Ht 5\' 6"  (1.676 m)  Wt 161 lb 3.2 oz (73.12 kg)  BMI 26.02 kg/m2 She is a pleasant mildly overweight white female in no acute distress. Her HEENT exam is unremarkable. She is normocephalic, atraumatic. Pupils are equal round and reactive to light and accommodation. Extraocular movements are full. Oropharynx is clear. Neck is supple no JVD, adenopathy, thyromegaly, or bruits. Lungs are clear. Cardiac exam reveals a regular rate and rhythm without gallop, murmur, or click. Abdomen is soft and nontender without masses or bruits. Femoral and pedal pulses are 2+ and symmetric. She has no edema. Skin is warm and dry. Her neurologic exam is nonfocal. LABORATORY DATA: ECG demonstrates normal sinus rhythm with a normal ECG.  Assessment / Plan:

## 2011-04-25 NOTE — Patient Instructions (Signed)
Continue your current medications with focus on cholesterol and blood pressure control.   Eat a healthy diet ie. Mediterranean.  Get regular aerobic exercise 30-45 minutes 5-7 days.

## 2011-04-25 NOTE — Assessment & Plan Note (Signed)
Alison [name] does have 3 cardiac risk factors including hypertension, hyperlipidemia, and family history of early coronary disease. She is completely asymptomatic. Her ECG and cardiac exam are normal. At this point the most appropriate measures will be continued risk factor modification. Stress testing is not indicated since she is asymptomatic. We did discuss the potential warning symptoms of chest pain, dyspnea, fatigue, or atypical presentations of cardiac disease. She appears to be on appropriate medications at this point. She does need to increase her regular aerobic exercise. She also needs to improve her diet based on a Mediterranean diet.

## 2011-04-25 NOTE — Assessment & Plan Note (Signed)
Blood pressure is well controlled on her current medications. I stressed the importance of sodium restriction.

## 2011-04-25 NOTE — Assessment & Plan Note (Signed)
She is on appropriate statin therapy. She needs to increase her aerobic activity and lose a few pounds.

## 2011-08-31 LAB — BASIC METABOLIC PANEL
BUN: 9
CO2: 28
Calcium: 9.5
Chloride: 100
Creatinine, Ser: 0.6
GFR calc Af Amer: >60
GFR calc non Af Amer: >60
Glucose, Bld: 93
Potassium: 4.5
Sodium: 135

## 2011-08-31 LAB — POCT HEMOGLOBIN-HEMACUE: Hemoglobin: 12.7

## 2011-09-03 LAB — BASIC METABOLIC PANEL
BUN: 6
CO2: 28
Calcium: 9.2
Chloride: 102
Creatinine, Ser: 0.62
GFR calc Af Amer: >60
GFR calc non Af Amer: >60
Glucose, Bld: 100 — ABNORMAL HIGH
Potassium: 3.9
Sodium: 137

## 2011-09-03 LAB — POCT HEMOGLOBIN-HEMACUE: Hemoglobin: 12.7

## 2012-01-09 ENCOUNTER — Ambulatory Visit
Admission: RE | Admit: 2012-01-09 | Discharge: 2012-01-09 | Disposition: A | Discharge status: Home / Self Care | Payer: BC Managed Care – PPO | Source: Ambulatory Visit | Attending: Rheumatology | Admitting: Rheumatology

## 2012-01-09 ENCOUNTER — Other Ambulatory Visit: Payer: Self-pay | Admitting: Rheumatology

## 2012-01-09 DIAGNOSIS — Z0189 Encounter for other specified special examinations: Secondary | ICD-10-CM

## 2012-01-11 ENCOUNTER — Other Ambulatory Visit (HOSPITAL_COMMUNITY): Payer: Self-pay | Admitting: Pharmacy Technician

## 2012-01-11 ENCOUNTER — Other Ambulatory Visit (HOSPITAL_COMMUNITY): Payer: Self-pay | Admitting: Rheumatology

## 2012-01-11 DIAGNOSIS — R9389 Abnormal findings on diagnostic imaging of other specified body structures: Secondary | ICD-10-CM

## 2012-01-16 ENCOUNTER — Ambulatory Visit (HOSPITAL_COMMUNITY)
Admission: RE | Admit: 2012-01-16 | Discharge: 2012-01-16 | Disposition: A | Discharge status: Home / Self Care | Payer: BC Managed Care – PPO | Source: Ambulatory Visit | Attending: Rheumatology | Admitting: Rheumatology

## 2012-01-16 ENCOUNTER — Encounter (HOSPITAL_COMMUNITY): Payer: Self-pay

## 2012-01-16 DIAGNOSIS — R222 Localized swelling, mass and lump, trunk: Secondary | ICD-10-CM | POA: Insufficient documentation

## 2012-01-16 DIAGNOSIS — J984 Other disorders of lung: Secondary | ICD-10-CM | POA: Insufficient documentation

## 2012-01-16 DIAGNOSIS — R599 Enlarged lymph nodes, unspecified: Secondary | ICD-10-CM | POA: Insufficient documentation

## 2012-01-16 DIAGNOSIS — R9389 Abnormal findings on diagnostic imaging of other specified body structures: Secondary | ICD-10-CM

## 2012-01-16 DIAGNOSIS — K449 Diaphragmatic hernia without obstruction or gangrene: Secondary | ICD-10-CM | POA: Insufficient documentation

## 2012-01-16 MED ORDER — IOHEXOL 300 MG/ML  SOLN
55.0000 mL | Freq: Once | INTRAMUSCULAR | Status: AC | PRN
  Administered 2012-01-16: 55 mL via INTRAVENOUS

## 2012-01-29 ENCOUNTER — Telehealth: Payer: Self-pay | Admitting: Internal Medicine

## 2012-01-29 NOTE — Telephone Encounter (Signed)
Dr Fatima Sanger called saying patient came to see her for inflammatory arthritis. ACE level was high 76. CXR shows worsening extensive hilar nodes. So she has held off starting methotrexate and instead wants eval by [place]. Please set up CHest CT with contrast and have her come in asap.

## 2012-01-29 NOTE — Telephone Encounter (Signed)
Pt had CT chest on 01-16-12. Appt set to see MR tomorrow at 3:45. Pt aware. Carron Curie, CMA

## 2012-01-30 ENCOUNTER — Ambulatory Visit (INDEPENDENT_AMBULATORY_CARE_PROVIDER_SITE_OTHER): Payer: BC Managed Care – PPO | Admitting: Internal Medicine

## 2012-01-30 ENCOUNTER — Encounter: Payer: Self-pay | Admitting: *Deleted

## 2012-01-30 VITALS — BP 128/82 | HR 86 | Temp 98.0°F | Ht 66.5 in | Wt 155.8 lb

## 2012-01-30 DIAGNOSIS — R590 Localized enlarged lymph nodes: Secondary | ICD-10-CM

## 2012-01-30 DIAGNOSIS — R599 Enlarged lymph nodes, unspecified: Secondary | ICD-10-CM

## 2012-01-30 NOTE — Progress Notes (Signed)
Subjective:    Patient ID: Alison [name], female    DOB: 1956-10-25, 56 y.o.   MRN: [id number]  HPI IOV 01/30/2012 56 year old lade. Referred by Dr Fatima Sanger. TEaches kinder music. She had called 01/28/12 saying patient came to see her for inflammatory arthritis. ACE level was high 76. CT shows worsening extensive hilar nodes. So she has held off starting methotrexate and instead wants eval by [place]. Has hx of schwanomma outside stomach in 2010 and is s/p excision Body mass index is 24.77 kg/(m^2).  reports that she has never smoked. She does not have any smokeless tobacco history on file.  States that in Jan 2013 reported bilateral insidious onset severe ankle pain that prevented her from walking and edema. Saw Dr Corliss Skains. Apparently blood work suggested sarcoidosis and prednisone started 4 weeks ago and with this ankle pain improved. As part of workup CXR and CT done: showed below abnormality below. 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 Denies rash on shin, palpitations but does have vision problems which she thinks is refraction related. Denies red eyes.  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 Denies family hx of sarcoidosis. Denies night fevers but does have night sweats on and off few months. Currently on pred taper for arthritis. Feels taper is making ankle pain return . She is keen to get quick diagnosis. Methotrexate under consideration but on hold till CT abnormalities sorted out.    Lab works2/6/13 with Dr Corliss Skains: Eso 1%, ESER 14, TSH normal 1.6, RF < 10, ANA 1:!60 speckled,  HLA b27 - negative, normal UA, ccP < 2 and negative, ENA panel ssA, ssB, Smth, RNP, DS DNA all negative except ssA/Ro  slightly high at 42 . Lupous anticoagulant negative, Hep Virus panel negative. Normal complement . ACE 76. PPD read 01/09/12 - NEGATIVE  Ct Chest W  Contrast  01/16/2012  *RADIOLOGY REPORT*  Clinical Data: Right hilar prominence noted on recent chest x-ray. Evaluate for possible sarcoidosis.  CT CHEST WITH CONTRAST  Technique:  Multidetector CT imaging of the chest was performed following the standard protocol during bolus administration of intravenous contrast.  Contrast: 55mL OMNIPAQUE IOHEXOL 300 MG/ML IV SOLN  Comparison: Chest x-ray 01/09/2012.Chest CT 02/21/2009.  Findings:  Mediastinum: Heart size is normal. There is no significant pericardial fluid, thickening or pericardial calcification. Multiple borderline enlarged and mildly enlarged mediastinal and bilateral hilar lymph nodes.  The largest single mediastinal lymph node is a low right paratracheal node measuring 11 mm in short axis (image 19 of series 2). A prominent cluster of subcarinal lymph nodes measures up to the 2.1 x 1.5 cm. Largest conglomeration of hilar nodes is in the right hilar region measuring up to 1.9 x 2.3 cm.  Nodes have increased in number and size compared to prior examination from 02/21/2009. There is a small hiatal hernia.  Lungs/Pleura: There are a few small nodules associated with both major fissures (largest measures up to 4 mm in length in the right major fissure (image 29 of series 3)).  No architectural distortion and/or upward retraction of the hila on today's examination. Minimal dependent atelectasis in the right lower lobe.  No consolidative airspace disease.  No pleural effusions.  Upper Abdomen: Surgical changes adjacent to the cardia of the stomach, consistent with resection of previously noted gastric lesion.  Remainder of visualized upper abdomen is otherwise  unremarkable.  Musculoskeletal: There are no aggressive appearing lytic or blastic lesions noted in the visualized portions of the skeleton.  IMPRESSION: 1.  Interval development of extensive mediastinal and bilateral hilar adenopathy, as detailed above.  This is nonspecific, but could certainly represent a  manifestation of sarcoidosis.  Other differential considerations would include lymphoma, however, given the absence of axillary adenopathy, this is less favored. 2.  There are several tiny nodules (all 4 mm or less in size) associated with the major fissures bilaterally.  These may simply represent subpleural nodules as can be seen in the setting of sarcoidosis. However, they are nonspecific. If the patient is at high risk for bronchogenic carcinoma, follow-up chest CT at 1 year is recommended.  If the patient is at low risk, no follow-up is needed.   Original Report Authenticated By: Florencia Reasons, M.D.      Past Medical History  Diagnosis Date  . Chest pain   . Hypertension   . Hyperlipidemia   . RLS (restless legs syndrome)   . Endometriosis   . Inflammatory arthritis   . Vitamin d deficiency      Family History  Problem Relation Age of Onset  . Asthma Mother   . Cancer Maternal Grandfather   . Diabetes Mother     and sister, MGM  . Heart disease Father   . Hypertension Paternal Grandmother   . Heart attack Father   . Hypertension Father      History   Social History  . Marital Status: Married    Spouse Name: N/A    Number of Children: 1  . Years of Education: N/A   Occupational History  . music teacher    Social History Main Topics  . Smoking status: Never Smoker   . Smokeless tobacco: Not on file  . Alcohol Use: 1.5 oz/week    3 drink(s) per week  . Drug Use: No  . Sexually Active: Not on file   Other Topics Concern  . Not on file   Social History Narrative  . No narrative on file     No Known Allergies   Outpatient Prescriptions Prior to Visit  Medication Sig Dispense Refill  . azelastine (ASTELIN) 137 MCG/SPRAY nasal spray 1 spray by Nasal route 2 (two) times daily. Use in each nostril as directed       . clonazePAM (KLONOPIN) 0.5 MG tablet Take 0.25 mg by mouth daily.       Marland Kitchen lovastatin (MEVACOR) 40 MG tablet Take 20 mg by mouth at bedtime.        . valsartan-hydrochlorothiazide (DIOVAN-HCT) 320-25 MG per tablet Take 1 tablet by mouth daily. 1.5 tablets      . fish oil-omega-3 fatty acids 1000 MG capsule Take by mouth daily.                Review of Systems  Constitutional: Positive for appetite change. Negative for fever and unexpected weight change.  HENT: Negative for ear pain, nosebleeds, congestion, sore throat, rhinorrhea, sneezing, trouble swallowing, dental problem, postnasal drip and sinus pressure.   Eyes: Negative for redness and itching.  Respiratory: Negative for cough, chest tightness, shortness of breath and wheezing.   Cardiovascular: Positive for chest pain and leg swelling. Negative for palpitations.  Gastrointestinal: Negative for nausea and vomiting.  Genitourinary: Negative for dysuria.  Musculoskeletal: Positive for joint swelling.  Skin: Negative for rash.  Neurological: Negative for headaches.  Hematological: Does not bruise/bleed easily.  Psychiatric/Behavioral: Negative for dysphoric  mood. The patient is not nervous/anxious.        Objective:   Physical Exam  Vitals reviewed. Constitutional: She is oriented to person, place, and time. She appears well-developed and well-nourished. No distress.  HENT:  Head: Normocephalic and atraumatic.  Right Ear: External ear normal.  Left Ear: External ear normal.  Mouth/Throat: Oropharynx is clear and moist. No oropharyngeal exudate.  Eyes: Conjunctivae and EOM are normal. Pupils are equal, round, and reactive to light. Right eye exhibits no discharge. Left eye exhibits no discharge. No scleral icterus.  Neck: Normal range of motion. Neck supple. No JVD present. No tracheal deviation present. No thyromegaly present.  Cardiovascular: Normal rate, regular rhythm, normal heart sounds and intact distal pulses.  Exam reveals no gallop and no friction rub.   No murmur heard. Pulmonary/Chest: Effort normal and breath sounds normal. No respiratory distress. She  has no wheezes. She has no rales. She exhibits no tenderness.  Abdominal: Soft. Bowel sounds are normal. She exhibits no distension and no mass. There is no tenderness. There is no rebound and no guarding.  Musculoskeletal: Normal range of motion. She exhibits no edema and no tenderness.  Lymphadenopathy:    She has no cervical adenopathy.  Neurological: She is alert and oriented to person, place, and time. She has normal reflexes. No cranial nerve deficit. She exhibits normal muscle tone. Coordination normal.  Skin: Skin is warm and dry. No rash noted. She is not diaphoretic. No erythema. No pallor.  Psychiatric: She has a normal mood and affect. Her behavior is normal. Judgment and thought content normal.          Assessment & Plan:  r

## 2012-01-30 NOTE — Patient Instructions (Signed)
I am going to discuss with Dr Delton Coombes about scheduling you for bronch biopsy Based on that you might need PET scan Please wait to hear more from [place]

## 2012-02-01 DIAGNOSIS — R590 Localized enlarged lymph nodes: Secondary | ICD-10-CM

## 2012-02-01 HISTORY — DX: Localized enlarged lymph nodes: R59.0

## 2012-02-04 ENCOUNTER — Telehealth: Payer: Self-pay | Admitting: Internal Medicine

## 2012-02-04 ENCOUNTER — Encounter: Payer: Self-pay | Admitting: Internal Medicine

## 2012-02-04 DIAGNOSIS — D860 Sarcoidosis of lung: Secondary | ICD-10-CM | POA: Insufficient documentation

## 2012-02-04 NOTE — Telephone Encounter (Signed)
Spoke to patient and expressed that best to proceed with PET. She agrees with plan. Please set up with PET. Order done

## 2012-02-04 NOTE — Assessment & Plan Note (Addendum)
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 However, best to get diagnosis. Will start with PET scan that can help identify other nodes that might be enlarged elsewhere and also PET hot lesions that could help [place] target EBUS bx (explained bronch method of biopsy). If PET cold, then serial followup only. Explained natural course of sarcoid

## 2012-02-06 NOTE — Telephone Encounter (Signed)
appt set for 02-19-12. Carron Curie, CMA

## 2012-02-15 ENCOUNTER — Other Ambulatory Visit (HOSPITAL_COMMUNITY): Payer: BC Managed Care – PPO

## 2012-02-19 ENCOUNTER — Encounter (HOSPITAL_COMMUNITY)
Admission: RE | Admit: 2012-02-19 | Discharge: 2012-02-19 | Disposition: A | Discharge status: Home / Self Care | Payer: BC Managed Care – PPO | Source: Ambulatory Visit | Attending: Internal Medicine | Admitting: Internal Medicine

## 2012-02-19 ENCOUNTER — Telehealth: Payer: Self-pay | Admitting: Internal Medicine

## 2012-02-19 DIAGNOSIS — R918 Other nonspecific abnormal finding of lung field: Secondary | ICD-10-CM | POA: Insufficient documentation

## 2012-02-19 DIAGNOSIS — R590 Localized enlarged lymph nodes: Secondary | ICD-10-CM

## 2012-02-19 DIAGNOSIS — R599 Enlarged lymph nodes, unspecified: Secondary | ICD-10-CM | POA: Insufficient documentation

## 2012-02-19 LAB — GLUCOSE, CAPILLARY: Glucose-Capillary: 104 mg/dL — ABNORMAL HIGH (ref 70–99)

## 2012-02-19 MED ORDER — FLUDEOXYGLUCOSE F - 18 (FDG) INJECTION
18.7000 | Freq: Once | INTRAVENOUS | Status: AC | PRN
  Administered 2012-02-19: 18.7 via INTRAVENOUS

## 2012-02-19 NOTE — Telephone Encounter (Signed)
Gave pet scan results - mediastinal node hot - needs eBUS. Small lung nodules  - doubt needs ENB. Will d/w [facility] this Thursday. For now try to set up for 3/27 wed or 3/28 thursdaay for EBUS but could change to ENB.  Let me know availability before confirming with OR/patient. Dr Herma Carson will come to asssist me  Sending to [facility]

## 2012-02-20 NOTE — Telephone Encounter (Signed)
ebus scheduled for 8:30am 02/27/12 will you call this pt or should i and do i need to contact dr z?

## 2012-02-21 ENCOUNTER — Telehealth: Payer: Self-pay | Admitting: Internal Medicine

## 2012-02-21 NOTE — Telephone Encounter (Signed)
I d/w Dr Wayland Salinas [facility]: he has agreed to provide back up for mediastinascopy immediately after EBUS if EBUS non diagnostic. Explained this to patient and she is agreeable with plan. D/w coordinator Oneita Jolly who will be in touch with CV[facility] coordinator [facility]

## 2012-02-22 ENCOUNTER — Other Ambulatory Visit: Payer: Self-pay | Admitting: Internal Medicine

## 2012-02-22 ENCOUNTER — Encounter (HOSPITAL_COMMUNITY): Payer: Self-pay | Admitting: Pharmacy Technician

## 2012-02-25 ENCOUNTER — Encounter (HOSPITAL_COMMUNITY)
Admission: RE | Admit: 2012-02-25 | Discharge: 2012-02-25 | Disposition: A | Discharge status: Home / Self Care | Payer: BC Managed Care – PPO | Source: Ambulatory Visit | Attending: Pulmonary Disease | Admitting: Pulmonary Disease

## 2012-02-25 ENCOUNTER — Encounter (HOSPITAL_COMMUNITY): Payer: Self-pay

## 2012-02-25 HISTORY — DX: Anemia, unspecified: D64.9

## 2012-02-25 HISTORY — DX: Gastro-esophageal reflux disease without esophagitis: K21.9

## 2012-02-25 HISTORY — DX: Headache: R51

## 2012-02-25 LAB — BASIC METABOLIC PANEL
BUN: 7 mg/dL (ref 6–23)
CO2: 26 mEq/L (ref 19–32)
Calcium: 9.5 mg/dL (ref 8.4–10.5)
Chloride: 100 mEq/L (ref 96–112)
Creatinine, Ser: 0.62 mg/dL (ref 0.50–1.10)
GFR calc Af Amer: >90 mL/min (ref >90)
GFR calc non Af Amer: >90 mL/min (ref >90)
Glucose, Bld: 111 mg/dL — ABNORMAL HIGH (ref 70–99)
Potassium: 3.4 mEq/L — ABNORMAL LOW (ref 3.5–5.1)
Sodium: 137 mEq/L (ref 135–145)

## 2012-02-25 LAB — CBC
HCT: 41.3 % (ref 36.0–46.0)
Hemoglobin: 14 g/dL (ref 12.0–15.0)
MCH: 28.7 pg (ref 26.0–34.0)
MCHC: 33.9 g/dL (ref 30.0–36.0)
MCV: 84.8 fL (ref 78.0–100.0)
Platelets: 309 10*3/uL (ref 150–400)
RBC: 4.87 MIL/uL (ref 3.87–5.11)
RDW: 13.5 % (ref 11.5–15.5)
WBC: 11.1 10*3/uL — ABNORMAL HIGH (ref 4.0–10.5)

## 2012-02-25 LAB — SURGICAL PCR SCREEN
MRSA, PCR: NEGATIVE
Staphylococcus aureus: POSITIVE — AB

## 2012-02-25 NOTE — Pre-Procedure Instructions (Signed)
20 Alison [name]  02/25/2012   Your procedure is scheduled on:  Wednesday February 27, 2012  Report to [facility] at 0630 AM.  Call this number if you have problems the morning of surgery: [phone]   Remember:   Do not eat food:After Midnight.  May have clear liquids: up to 4 Hours before arrival. (up to 2:30AM)  Clear liquids include soda, tea, black coffee, apple or grape juice, broth.  Take these medicines the morning of surgery with A SIP OF WATER: clonazepam, prednisone, aselastine   Do not wear jewelry, make-up or nail polish.  Do not wear lotions, powders, or perfumes. You may wear deodorant.  Do not shave 48 hours prior to surgery.  Do not bring valuables to the hospital.  Contacts, dentures or bridgework may not be worn into surgery.  Leave suitcase in the car. After surgery it may be brought to your room.  For patients admitted to the hospital, checkout time is 11:00 AM the day of discharge.   Patients discharged the day of surgery will not be allowed to drive home.  Name and phone number of your driver: Shyniece Scripter [phone]  Special Instructions: CHG Shower Use Special Wash: 1/2 bottle night before surgery and 1/2 bottle morning of surgery.   Please read over the following fact sheets that you were given: Pain Booklet, Coughing and Deep Breathing, MRSA Information and Surgical Site Infection Prevention

## 2012-02-26 MED ORDER — PHENYLEPHRINE HCL 0.25 % NA SOLN
1.0000 | Freq: Four times a day (QID) | NASAL | Status: DC | PRN
  Filled 2012-02-26: qty 15

## 2012-02-27 ENCOUNTER — Encounter (HOSPITAL_COMMUNITY): Payer: Self-pay

## 2012-02-27 ENCOUNTER — Encounter (HOSPITAL_COMMUNITY)
Admission: RE | Disposition: A | Discharge status: Home / Self Care | Payer: Self-pay | Source: Ambulatory Visit | Attending: Pulmonary Disease

## 2012-02-27 ENCOUNTER — Ambulatory Visit (HOSPITAL_COMMUNITY): Payer: BC Managed Care – PPO

## 2012-02-27 ENCOUNTER — Ambulatory Visit (HOSPITAL_COMMUNITY)
Admission: RE | Admit: 2012-02-27 | Discharge: 2012-02-27 | Disposition: A | Discharge status: Home / Self Care | Payer: BC Managed Care – PPO | Source: Ambulatory Visit | Attending: Pulmonary Disease | Admitting: Pulmonary Disease

## 2012-02-27 ENCOUNTER — Encounter (HOSPITAL_COMMUNITY): Payer: Self-pay | Admitting: Internal Medicine

## 2012-02-27 DIAGNOSIS — K219 Gastro-esophageal reflux disease without esophagitis: Secondary | ICD-10-CM | POA: Insufficient documentation

## 2012-02-27 DIAGNOSIS — R599 Enlarged lymph nodes, unspecified: Secondary | ICD-10-CM | POA: Insufficient documentation

## 2012-02-27 DIAGNOSIS — Z01812 Encounter for preprocedural laboratory examination: Secondary | ICD-10-CM | POA: Insufficient documentation

## 2012-02-27 DIAGNOSIS — R0609 Other forms of dyspnea: Secondary | ICD-10-CM | POA: Insufficient documentation

## 2012-02-27 DIAGNOSIS — R0989 Other specified symptoms and signs involving the circulatory and respiratory systems: Secondary | ICD-10-CM | POA: Insufficient documentation

## 2012-02-27 DIAGNOSIS — R51 Headache: Secondary | ICD-10-CM | POA: Insufficient documentation

## 2012-02-27 DIAGNOSIS — I10 Essential (primary) hypertension: Secondary | ICD-10-CM | POA: Insufficient documentation

## 2012-02-27 HISTORY — DX: Localized enlarged lymph nodes: R59.0

## 2012-02-27 SURGERY — ENDOBRONCHIAL ULTRASOUND (EBUS)
Anesthesia: General | Laterality: Bilateral

## 2012-02-27 SURGERY — BRONCHOSCOPY, WITH EBUS
Anesthesia: General | Site: Chest | Wound class: Clean Contaminated

## 2012-02-27 MED ORDER — LACTATED RINGERS IV SOLN
INTRAVENOUS | Status: DC | PRN
  Administered 2012-02-27 (×2): via INTRAVENOUS

## 2012-02-27 MED ORDER — LIDOCAINE HCL (CARDIAC) 20 MG/ML IV SOLN
INTRAVENOUS | Status: DC | PRN
  Administered 2012-02-27: 15 mg via INTRAVENOUS

## 2012-02-27 MED ORDER — SODIUM CHLORIDE 0.9 % IV SOLN: Freq: Once | INTRAVENOUS | Status: DC

## 2012-02-27 MED ORDER — GLYCOPYRROLATE 0.2 MG/ML IJ SOLN
INTRAMUSCULAR | Status: DC | PRN
  Administered 2012-02-27: .8 mg via INTRAVENOUS

## 2012-02-27 MED ORDER — 0.9 % SODIUM CHLORIDE (POUR BTL) OPTIME
TOPICAL | Status: DC | PRN
  Administered 2012-02-27: 1000 mL

## 2012-02-27 MED ORDER — ONDANSETRON HCL 4 MG/2ML IJ SOLN
INTRAMUSCULAR | Status: DC | PRN
  Administered 2012-02-27: 4 mg via INTRAVENOUS

## 2012-02-27 MED ORDER — VECURONIUM BROMIDE 10 MG IV SOLR
INTRAVENOUS | Status: DC | PRN
  Administered 2012-02-27: 7 mg via INTRAVENOUS

## 2012-02-27 MED ORDER — MIDAZOLAM HCL 5 MG/5ML IJ SOLN
INTRAMUSCULAR | Status: DC | PRN
  Administered 2012-02-27: 1 mg via INTRAVENOUS

## 2012-02-27 MED ORDER — FENTANYL CITRATE 0.05 MG/ML IJ SOLN
INTRAMUSCULAR | Status: DC | PRN
  Administered 2012-02-27: 50 ug via INTRAVENOUS
  Administered 2012-02-27: 100 ug via INTRAVENOUS

## 2012-02-27 MED ORDER — EPHEDRINE SULFATE 50 MG/ML IJ SOLN
INTRAMUSCULAR | Status: DC | PRN
  Administered 2012-02-27: 5 mg via INTRAVENOUS
  Administered 2012-02-27: 10 mg via INTRAVENOUS
  Administered 2012-02-27: 5 mg via INTRAVENOUS
  Administered 2012-02-27: 10 mg via INTRAVENOUS

## 2012-02-27 MED ORDER — LIDOCAINE HCL (PF) 1 % IJ SOLN
INTRAMUSCULAR | Status: DC | PRN
  Administered 2012-02-27: 30 mL

## 2012-02-27 MED ORDER — NEOSTIGMINE METHYLSULFATE 1 MG/ML IJ SOLN
INTRAMUSCULAR | Status: DC | PRN
  Administered 2012-02-27: 4 mg via INTRAVENOUS

## 2012-02-27 MED ORDER — FENTANYL CITRATE 0.05 MG/ML IJ SOLN: 25.0000 ug | INTRAMUSCULAR | Status: DC | PRN

## 2012-02-27 MED ORDER — DEXAMETHASONE SODIUM PHOSPHATE 4 MG/ML IJ SOLN
INTRAMUSCULAR | Status: DC | PRN
  Administered 2012-02-27: 8 mg via INTRAVENOUS

## 2012-02-27 MED ORDER — PROPOFOL 10 MG/ML IV EMUL
INTRAVENOUS | Status: DC | PRN
  Administered 2012-02-27: 150 mg via INTRAVENOUS

## 2012-02-27 SURGICAL SUPPLY — 25 items
BRUSH CYTOL CELLEBRITY 1.5X140 (MISCELLANEOUS) IMPLANT
CANISTER SUCTION 2500CC (MISCELLANEOUS) ×2 IMPLANT
CLOTH BEACON ORANGE TIMEOUT ST (SAFETY) ×2 IMPLANT
CONT SPEC 4OZ CLIKSEAL STRL BL (MISCELLANEOUS) ×2 IMPLANT
COVER TABLE BACK 60X90 (DRAPES) ×2 IMPLANT
FORCEPS BIOP RJ4 1.8 (CUTTING FORCEPS) IMPLANT
GLOVE BIOGEL PI IND STRL 6.5 (GLOVE) ×1 IMPLANT
GLOVE BIOGEL PI INDICATOR 6.5 (GLOVE) ×1
GLOVE SURG SIGNA 7.5 PF LTX (GLOVE) ×2 IMPLANT
GOWN STRL NON-REIN LRG LVL3 (GOWN DISPOSABLE) ×2 IMPLANT
KIT ROOM TURNOVER OR (KITS) ×2 IMPLANT
MARKER SKIN DUAL TIP RULER LAB (MISCELLANEOUS) ×2 IMPLANT
NEEDLE BIOPSY TRANSBRONCH 21G (NEEDLE) IMPLANT
NEEDLE SYS SONOTIP II EBUSTBNA (NEEDLE) ×2 IMPLANT
NS IRRIG 1000ML POUR BTL (IV SOLUTION) ×2 IMPLANT
OIL SILICONE PENTAX (PARTS (SERVICE/REPAIRS)) IMPLANT
PAD ARMBOARD 7.5X6 YLW CONV (MISCELLANEOUS) ×4 IMPLANT
SPONGE GAUZE 4X4 12PLY (GAUZE/BANDAGES/DRESSINGS) IMPLANT
SYR 20CC LL (SYRINGE) ×2 IMPLANT
SYR 20ML ECCENTRIC (SYRINGE) ×4 IMPLANT
SYR 5ML LUER SLIP (SYRINGE) ×6 IMPLANT
SYR TOOMEY 50ML (SYRINGE) ×4 IMPLANT
TOWEL OR 17X24 6PK STRL BLUE (TOWEL DISPOSABLE) ×2 IMPLANT
TRAP SPECIMEN MUCOUS 40CC (MISCELLANEOUS) ×2 IMPLANT
TUBE CONNECTING 12X1/4 (SUCTIONS) ×2 IMPLANT

## 2012-02-27 NOTE — H&P (View-Only) (Signed)
Subjective:    Patient ID: Alison [name], female    DOB: 12/30/1955, 55 y.o.   MRN: [id number]  HPI IOV 01/30/2012 55 year old lade. Referred by Dr S Deveshwar. TEaches kinder music. She had called 01/28/12 saying patient came to see her for inflammatory arthritis. ACE level was high 76. CT shows worsening extensive hilar nodes. So she has held off starting methotrexate and instead wants eval by us. Has hx of schwanomma outside stomach in 2010 and is s/p excision Body mass index is 24.77 kg/(m^2).  reports that she has never smoked. She does not have any smokeless tobacco history on file.  States that in Jan 2013 reported bilateral insidious onset severe ankle pain that prevented her from walking and edema. Saw Dr Deveshwar. Apparently blood work suggested sarcoidosis and prednisone started 4 weeks ago and with this ankle pain improved. As part of workup CXR and CT done: showed below abnormality below. 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 Denies rash on shin, palpitations but does have vision problems which she thinks is refraction related. Denies red eyes.  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 Denies family hx of sarcoidosis. Denies night fevers but does have night sweats on and off few months. Currently on pred taper for arthritis. Feels taper is making ankle pain return . She is keen to get quick diagnosis. Methotrexate under consideration but on hold till CT abnormalities sorted out.    Lab works2/6/13 with Dr Deveshwar: Eso 1%, ESER 14, TSH normal 1.6, RF < 10, ANA 1:!60 speckled,  HLA b27 - negative, normal UA, ccP < 2 and negative, ENA panel ssA, ssB, Smth, RNP, DS DNA all negative except ssA/Ro  slightly high at 42 . Lupous anticoagulant negative, Hep Virus panel negative. Normal complement . ACE 76. PPD read 01/09/12 - NEGATIVE  Ct Chest W  Contrast  01/16/2012  *RADIOLOGY REPORT*  Clinical Data: Right hilar prominence noted on recent chest x-ray. Evaluate for possible sarcoidosis.  CT CHEST WITH CONTRAST  Technique:  Multidetector CT imaging of the chest was performed following the standard protocol during bolus administration of intravenous contrast.  Contrast: 55mL OMNIPAQUE IOHEXOL 300 MG/ML IV SOLN  Comparison: Chest x-ray 01/09/2012.Chest CT 02/21/2009.  Findings:  Mediastinum: Heart size is normal. There is no significant pericardial fluid, thickening or pericardial calcification. Multiple borderline enlarged and mildly enlarged mediastinal and bilateral hilar lymph nodes.  The largest single mediastinal lymph node is a low right paratracheal node measuring 11 mm in short axis (image 19 of series 2). A prominent cluster of subcarinal lymph nodes measures up to the 2.1 x 1.5 cm. Largest conglomeration of hilar nodes is in the right hilar region measuring up to 1.9 x 2.3 cm.  Nodes have increased in number and size compared to prior examination from 02/21/2009. There is a small hiatal hernia.  Lungs/Pleura: There are a few small nodules associated with both major fissures (largest measures up to 4 mm in length in the right major fissure (image 29 of series 3)).  No architectural distortion and/or upward retraction of the hila on today's examination. Minimal dependent atelectasis in the right lower lobe.  No consolidative airspace disease.  No pleural effusions.  Upper Abdomen: Surgical changes adjacent to the cardia of the stomach, consistent with resection of previously noted gastric lesion.  Remainder of visualized upper abdomen is otherwise   unremarkable.  Musculoskeletal: There are no aggressive appearing lytic or blastic lesions noted in the visualized portions of the skeleton.  IMPRESSION: 1.  Interval development of extensive mediastinal and bilateral hilar adenopathy, as detailed above.  This is nonspecific, but could certainly represent a  manifestation of sarcoidosis.  Other differential considerations would include lymphoma, however, given the absence of axillary adenopathy, this is less favored. 2.  There are several tiny nodules (all 4 mm or less in size) associated with the major fissures bilaterally.  These may simply represent subpleural nodules as can be seen in the setting of sarcoidosis. However, they are nonspecific. If the patient is at high risk for bronchogenic carcinoma, follow-up chest CT at 1 year is recommended.  If the patient is at low risk, no follow-up is needed.   Original Report Authenticated By: DANIEL W. ENTRIKIN, M.D.      Past Medical History  Diagnosis Date  . Chest pain   . Hypertension   . Hyperlipidemia   . RLS (restless legs syndrome)   . Endometriosis   . Inflammatory arthritis   . Vitamin d deficiency      Family History  Problem Relation Age of Onset  . Asthma Mother   . Cancer Maternal Grandfather   . Diabetes Mother     and sister, MGM  . Heart disease Father   . Hypertension Paternal Grandmother   . Heart attack Father   . Hypertension Father      History   Social History  . Marital Status: Married    Spouse Name: N/A    Number of Children: 1  . Years of Education: N/A   Occupational History  . music teacher    Social History Main Topics  . Smoking status: Never Smoker   . Smokeless tobacco: Not on file  . Alcohol Use: 1.5 oz/week    3 drink(s) per week  . Drug Use: No  . Sexually Active: Not on file   Other Topics Concern  . Not on file   Social History Narrative  . No narrative on file     No Known Allergies   Outpatient Prescriptions Prior to Visit  Medication Sig Dispense Refill  . azelastine (ASTELIN) 137 MCG/SPRAY nasal spray 1 spray by Nasal route 2 (two) times daily. Use in each nostril as directed       . clonazePAM (KLONOPIN) 0.5 MG tablet Take 0.25 mg by mouth daily.       . lovastatin (MEVACOR) 40 MG tablet Take 20 mg by mouth at bedtime.        . valsartan-hydrochlorothiazide (DIOVAN-HCT) 320-25 MG per tablet Take 1 tablet by mouth daily. 1.5 tablets      . fish oil-omega-3 fatty acids 1000 MG capsule Take by mouth daily.                Review of Systems  Constitutional: Positive for appetite change. Negative for fever and unexpected weight change.  HENT: Negative for ear pain, nosebleeds, congestion, sore throat, rhinorrhea, sneezing, trouble swallowing, dental problem, postnasal drip and sinus pressure.   Eyes: Negative for redness and itching.  Respiratory: Negative for cough, chest tightness, shortness of breath and wheezing.   Cardiovascular: Positive for chest pain and leg swelling. Negative for palpitations.  Gastrointestinal: Negative for nausea and vomiting.  Genitourinary: Negative for dysuria.  Musculoskeletal: Positive for joint swelling.  Skin: Negative for rash.  Neurological: Negative for headaches.  Hematological: Does not bruise/bleed easily.  Psychiatric/Behavioral: Negative for dysphoric   mood. The patient is not nervous/anxious.        Objective:   Physical Exam  Vitals reviewed. Constitutional: She is oriented to person, place, and time. She appears well-developed and well-nourished. No distress.  HENT:  Head: Normocephalic and atraumatic.  Right Ear: External ear normal.  Left Ear: External ear normal.  Mouth/Throat: Oropharynx is clear and moist. No oropharyngeal exudate.  Eyes: Conjunctivae and EOM are normal. Pupils are equal, round, and reactive to light. Right eye exhibits no discharge. Left eye exhibits no discharge. No scleral icterus.  Neck: Normal range of motion. Neck supple. No JVD present. No tracheal deviation present. No thyromegaly present.  Cardiovascular: Normal rate, regular rhythm, normal heart sounds and intact distal pulses.  Exam reveals no gallop and no friction rub.   No murmur heard. Pulmonary/Chest: Effort normal and breath sounds normal. No respiratory distress. She  has no wheezes. She has no rales. She exhibits no tenderness.  Abdominal: Soft. Bowel sounds are normal. She exhibits no distension and no mass. There is no tenderness. There is no rebound and no guarding.  Musculoskeletal: Normal range of motion. She exhibits no edema and no tenderness.  Lymphadenopathy:    She has no cervical adenopathy.  Neurological: She is alert and oriented to person, place, and time. She has normal reflexes. No cranial nerve deficit. She exhibits normal muscle tone. Coordination normal.  Skin: Skin is warm and dry. No rash noted. She is not diaphoretic. No erythema. No pallor.  Psychiatric: She has a normal mood and affect. Her behavior is normal. Judgment and thought content normal.          Assessment & Plan:  r  

## 2012-02-27 NOTE — Discharge Instructions (Signed)
Mediastinoscopy Mediastinoscopy is a procedure in which a small incision (cut by the surgeon) is made over the notch near the top of the sternum (the chest bone between your ribs). The finger of the surgeon is used to bluntly dissect down to the area which is to be examined by a small instrument similar to a small telescope (mediastinoscope).  PURPOSE OF THE TEST This test allows the diagnosis (learning what is wrong) of disease of the lymph nodes around the trachea (your windpipe) and the area where the trachea splits into the major bronchi (large air passages) that go to each lung. This test is often done before surgery to see if a lung cancer has spread to these lymph nodes (staging). If it has spread, surgery may not be helpful. A different form of treatment for that particular tumor may be needed. LET YOUR CAREGIVER KNOW ABOUT:  Previous problems with anesthetics or medicines used to numb the skin.   Allergies to dyes, iodine, foods, and/or latex.   Medicines taken including herbs, eye drops, prescription medicines (especially medicines used to "thin the blood"), aspirin, and other over-the-counter medicines, and steroids (by mouth or as a cream).   History of bleeding or blood problems.   Possibility of pregnancy, if this applies.   History of blood clots in your legs and/or lungs .   Previous surgery.   Other important health problems.  BEFORE THE PROCEDURE   Stop smoking at least one week prior to surgery. This lowers risk during surgery.   Your caregiver may advise that you stop taking certain medications that may affect the outcome of the surgery and your ability to heal. For example, you may need to stop taking anti-inflammatories such as aspirin because of possible bleeding problems. Other medications may have interactions with anesthesia.   BE SURE TO LET YOUR CAREGIVERKNOW IF YOU HAVE BEEN ON STEROIDS FOR LONG PERIODS OF TIME. THIS IS CRITICAL.   Your caregiver will discuss  possible risks and complications with you before surgery. In addition to the usual risks of anesthesia, other common risks and complications include: blood loss and replacement, temporary increase in pain due to surgery, or possible collapse of a lung.  You should be present 60 minutes prior to your procedure or as directed.  AFTER THE PROCEDURE  After surgery, you will be taken to the recovery area. A nurse will watch and check your progress. Generally you will be allowed to go home within a week barring other problems. HOME CARE INSTRUCTIONS   Follow your caregiver's instructions as to activities.   Only take over-the-counter or prescription medicines for pain, discomfort, or fever as directed by your caregiver.  SEEK MEDICAL CARE IF:   Increased bleeding (more than a small spot) from the wound.   Redness, swelling, or increasing pain in the wound.   Pus coming from wound.   An unexplained oral temperature over 102 F (38.9 C).   A foul smell coming from the wound or dressing.  SEEK IMMEDIATE MEDICAL CARE IF:   You develop a rash.   You have difficulty breathing.   You have any allergic problems.  For the protection of your privacy, test results can not be given over the phone. Make sure you receive the results of your test. Ask as to how these results are to be obtained if you have not been informed. It is your responsibility to obtain your test results. Document Released: 02/25/2001 Document Revised: 11/08/2011 Document Reviewed: 10/22/2007 [employer] Patient Information 2012  [employer].

## 2012-02-27 NOTE — Interval H&P Note (Signed)
Last seen in office 01/30/12. Today 02/27/2012 is less han 36 days old. Interim had PET scan that showed nodes are active and PET hot. She has very small supraclavicular nodes and a very small pulmonary nodule; all this was considered difficult to access So, decision made for EBUS biopsy with video bronchoscopy. Risks of procedure, including non -diagnosis, gen anesthesia complication, bleeding and pneumothorax and rarely cardiac arrest explaiend . She verbalized understanding, agreed to undergo procedure. Husband at bedside today. Denies complaints. Arthritis somewhat better. This week she is on prednisone 5mg  daily - new medications. Otherwise well.   11 point ROS negative other than in HPI

## 2012-02-27 NOTE — Anesthesia Procedure Notes (Signed)
Procedure Name: Intubation Date/Time: 02/27/2012 8:43 AM Performed by: Carmela Rima Pre-anesthesia Checklist: Emergency Drugs available, Patient identified, Timeout performed, Patient being monitored and Suction available Patient Re-evaluated:Patient Re-evaluated prior to inductionOxygen Delivery Method: Circle system utilized Preoxygenation: Pre-oxygenation with 100% oxygen Intubation Type: IV induction Ventilation: Mask ventilation without difficulty Laryngoscope Size: Mac and 3 Grade View: Grade I Tube type: Oral Tube size: 8.5 mm Number of attempts: 1 Placement Confirmation: ETT inserted through vocal cords under direct vision,  breath sounds checked- equal and bilateral and positive ETCO2 Secured at: 23 cm Tube secured with: Tape Dental Injury: Teeth and Oropharynx as per pre-operative assessment

## 2012-02-27 NOTE — Anesthesia Preprocedure Evaluation (Addendum)
Anesthesia Evaluation  Patient identified by MRN, date of birth, ID band Patient awake    Reviewed: Allergy & Precautions, H&P , NPO status , Patient's Chart, lab work & pertinent test results, reviewed documented beta blocker date and time   Airway Mallampati: III TM Distance: <3 FB Neck ROM: full    Dental No notable dental hx. (+) Dental Advidsory Given and Teeth Intact   Pulmonary neg pulmonary ROS,  breath sounds clear to auscultation  Pulmonary exam normal       Cardiovascular hypertension, On Medications Rhythm:Regular Rate:Normal     Neuro/Psych  Headaches, negative psych ROS   GI/Hepatic Neg liver ROS, GERD-  Controlled,  Endo/Other  negative endocrine ROS  Renal/GU negative Renal ROS  negative genitourinary   Musculoskeletal   Abdominal   Peds  Hematology negative hematology ROS (+)   Anesthesia Other Findings   Reproductive/Obstetrics negative OB ROS                          Anesthesia Physical Anesthesia Plan  ASA: II  Anesthesia Plan: General ETT   Post-op Pain Management:    Induction: Intravenous  Airway Management Planned: Oral ETT  Additional Equipment:   Intra-op Plan:   Post-operative Plan: Extubation in OR  Informed Consent: I have reviewed the patients History and Physical, chart, labs and discussed the procedure including the risks, benefits and alternatives for the proposed anesthesia with the patient or authorized representative who has indicated his/her understanding and acceptance.   Dental Advisory Given  Plan Discussed with: Anesthesiologist, CRNA and Surgeon  Anesthesia Plan Comments:        Anesthesia Quick Evaluation

## 2012-02-27 NOTE — Transfer of Care (Signed)
Immediate Anesthesia Transfer of Care Note  Patient: Alison [name]  Procedure(s) Performed: Procedure(s) (LRB): VIDEO BRONCHOSCOPY WITH ENDOBRONCHIAL ULTRASOUND (N/A)  Patient Location: PACU  Anesthesia Type: General  Level of Consciousness: awake, alert  and oriented  Airway & Oxygen Therapy: Patient Spontanous Breathing and Patient connected to face mask oxygen  Post-op Assessment: Report given to PACU RN, Post -op Vital signs reviewed and stable and Patient moving all extremities  Post vital signs: Reviewed and stable  Complications: No apparent anesthesia complications

## 2012-02-27 NOTE — Anesthesia Postprocedure Evaluation (Signed)
  Anesthesia Post-op Note  Patient: Alison [name]  Procedure(s) Performed: Procedure(s) (LRB): VIDEO BRONCHOSCOPY WITH ENDOBRONCHIAL ULTRASOUND (N/A)  Patient Location: PACU  Anesthesia Type: General  Level of Consciousness: awake and alert   Airway and Oxygen Therapy: Patient Spontanous Breathing and Patient connected to face mask oxygen  Post-op Pain: mild  Post-op Assessment: Post-op Vital signs reviewed, Patient's Cardiovascular Status Stable, Respiratory Function Stable, Patent Airway and No signs of Nausea or vomiting  Post-op Vital Signs: Reviewed and stable  Complications: No apparent anesthesia complications

## 2012-02-27 NOTE — Op Note (Signed)
Name:  Alison [name] MRN:  [id number] DOB:  Jun 19, 1956  PROCEDURE NOTE  Procedure(s): Flexible bronchoscopy [phone]) Endobronchial ultrasound (19147) Transbronchial needle aspiration (82956) of the STATION 7 (SUBCARINAL) Transbronchial needle aspiration, additional lobe (21308) of the STATION 11L (LEFT HILAR) Transbronchial needle aspiration, additional lobe (65784) of the STATION 4R (RT PARATRACHEAL)  Operator   1. Dr Kalman Shan  2. Dr  Nation (assistant and proctor)  Indications:  Hilar / mediastinal lymphadenopathy.  Consent:  Procedure, benefits, risks and alternatives discussed.  Questions answered.  Consent obtained.  Anesthesia:  General endotracheal.  Procedure summary:  Appropriate equipment was assembled.  The patient was brought to the operating room and identified as Alison [name].  Safety timeout was performed. The patient was placed supine on the operating table, airway established and general anesthesia administered by Anesthesia team.   After the appropriate level of anesthesia was assured, flexible video bronchoscope was lubricated and inserted through the endotracheal tube.  Total of 12 mL of 1% Lidocaine were administered through the bronchoscope to augment general anesthesia.  Airway examination was performed bilaterally to subsegmental level.  Minimal clear secretions were noted, mucosa appeared normal and no endobronchial lesions were identified.  Endobronchial ultrasound video bronchoscope was then lubricated and inserted through the endotracheal tube. Surveillance of the mediastinal and and bilateral hilar lymph node stations was performed.  Pathologically enlarged lymph nodes were noted AS STATION 11L, STATION 4R, STATION 7  Endobronchial ultrasound guided transbronchial needle aspiration of THE SUBCARINAL STAIONS  (passes 4 FOR SUBCARINAL AND AT END 2 MORE FROM THE SAME STATION FOR FLOW CYTOMETRY), LEFT HILAR 11L  (passes FOUR) and STATION RT  PARATRACHEAL (passes 2) was performed, after which EBUS bronchoscope was withdrawn.  Flexible video bronchoscope was used again to perform random endobronchial mucosal biopsies.  After hemostasis was assured, the bronchoscope was withdrawn.  The patient was extubated in operating room and transferred to PACU.   Specimens sent: EBUS aspirates from all stations sent to cytology (prelim is abundant lymphocytes and ??? Granuloma) Flow cytometry sent from subcarinal station to evaluate for lymphoma  Complications:  No immediate complications were noted.  Hemodynamic parameters and oxygenation remained stable throughout the procedure.  Estimated blood loss:  Less then 3 mL.  Post procedure plan  - husband will be updated  - patient to go home after recovery and follow with Dr Marchelle Gearing for results; appointment will be made  Dr. Kalman Shan, M.D., [facility].C.P Pulmonary and Critical Care Medicine Staff Physician [facility] Pager: [phone], If no answer or between  15:00h - 7:00h: call [phone]  02/27/2012 9:48 AM

## 2012-02-27 NOTE — H&P (Signed)
  INTERVAL HX  - LAST SEEN IN office 01/30/12. Today is EBUS and date is 02/27/2012. H & P reviewed and is less than 65 days old. No change in health in interim. Only new issue is that she is on prednisone 5mg  daily past few days and arthritis is better. Denies chest pain, cough, edema. Orthopnea, paroxysnal nocturnal dyspnea   - Risks of procedure explained again - including non diagnosis, bleeding, pneumothorax, cardiac arrest, shock. She verbalized understanding and consented procedure. DPOA is husband at bedside through whom we will communicate   -

## 2012-02-28 ENCOUNTER — Telehealth: Payer: Self-pay | Admitting: Internal Medicine

## 2012-02-28 DIAGNOSIS — R599 Enlarged lymph nodes, unspecified: Secondary | ICD-10-CM

## 2012-02-28 DIAGNOSIS — D869 Sarcoidosis, unspecified: Secondary | ICD-10-CM

## 2012-02-28 NOTE — Telephone Encounter (Signed)
appt set for 03-21-12 at 4:30.Carron Curie, CMA

## 2012-02-28 NOTE — Telephone Encounter (Signed)
Please fax 02/27/12 biopsy results to Dr Pollyann Savoy there are 3 in epic

## 2012-02-28 NOTE — Telephone Encounter (Signed)
Give her appt 03/21/12 for fuof EBUS results and discussion. If I get results before that will call end next week or early the week after

## 2012-02-28 NOTE — Telephone Encounter (Signed)
EBUS Station 7 and 11L shows  Non necrotizing granuloma c/w sarcoid. Gave result to patient. 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 Currently advised to hold appt 03/21/12 but call several days before that to see if she needs to keep it or come in at later date. She will need periodic followup for this

## 2012-03-03 NOTE — Telephone Encounter (Signed)
Results faxed. Mercie Balsley, CMA  

## 2012-03-14 ENCOUNTER — Telehealth: Payer: Self-pay | Admitting: Internal Medicine

## 2012-03-14 NOTE — Telephone Encounter (Signed)
Spoke with pt. She is asking if her final results are back yet. She states that if ov next wk is not needed to let her know when you do want to see her back. Please advise, thanks!!

## 2012-03-14 NOTE — Telephone Encounter (Signed)
The result of bx and mylast conversation was sent to Dr D

## 2012-03-14 NOTE — Telephone Encounter (Signed)
No further updated. 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 You can tell her from lung standpooint prednisone not needed but she should communicate with Dr Corliss Skains. I would like to see her in 6-9 months to make sure things are going okay.

## 2012-03-14 NOTE — Telephone Encounter (Signed)
Called and spoke with pt and she is aware per MR recs.  She has appt with Dr. Corliss Skains on 5-2 and wanted to make sure any info that MR needs to send to Dr. Corliss Skains get sent to her before this appt.  Thanks.  Will sign off on phone note and forward to MR to make him aware.

## 2012-03-21 ENCOUNTER — Ambulatory Visit: Payer: BC Managed Care – PPO | Admitting: Internal Medicine

## 2012-06-10 ENCOUNTER — Other Ambulatory Visit (HOSPITAL_COMMUNITY): Payer: Self-pay | Admitting: Obstetrics and Gynecology

## 2012-06-10 DIAGNOSIS — Z1231 Encounter for screening mammogram for malignant neoplasm of breast: Secondary | ICD-10-CM

## 2012-07-08 ENCOUNTER — Ambulatory Visit (HOSPITAL_COMMUNITY)
Admission: RE | Admit: 2012-07-08 | Discharge: 2012-07-08 | Disposition: A | Discharge status: Home / Self Care | Payer: BC Managed Care – PPO | Source: Ambulatory Visit | Attending: Obstetrics and Gynecology | Admitting: Obstetrics and Gynecology

## 2012-07-08 DIAGNOSIS — Z1231 Encounter for screening mammogram for malignant neoplasm of breast: Secondary | ICD-10-CM | POA: Insufficient documentation

## 2012-10-16 ENCOUNTER — Ambulatory Visit (INDEPENDENT_AMBULATORY_CARE_PROVIDER_SITE_OTHER): Payer: BC Managed Care – PPO | Admitting: Internal Medicine

## 2012-10-16 ENCOUNTER — Encounter: Payer: Self-pay | Admitting: Internal Medicine

## 2012-10-16 VITALS — BP 130/84 | HR 81 | Temp 98.3°F | Ht 66.5 in | Wt 157.6 lb

## 2012-10-16 DIAGNOSIS — J99 Respiratory disorders in diseases classified elsewhere: Secondary | ICD-10-CM

## 2012-10-16 DIAGNOSIS — R1310 Dysphagia, unspecified: Secondary | ICD-10-CM | POA: Insufficient documentation

## 2012-10-16 DIAGNOSIS — D869 Sarcoidosis, unspecified: Secondary | ICD-10-CM

## 2012-10-16 DIAGNOSIS — D860 Sarcoidosis of lung: Secondary | ICD-10-CM

## 2012-10-16 DIAGNOSIS — J209 Acute bronchitis, unspecified: Secondary | ICD-10-CM | POA: Insufficient documentation

## 2012-10-16 DIAGNOSIS — R590 Localized enlarged lymph nodes: Secondary | ICD-10-CM

## 2012-10-16 DIAGNOSIS — R911 Solitary pulmonary nodule: Secondary | ICD-10-CM

## 2012-10-16 MED ORDER — DOXYCYCLINE HYCLATE 100 MG PO TABS: 100.0000 mg | ORAL_TABLET | Freq: Two times a day (BID) | ORAL | Status: DC

## 2012-10-16 NOTE — Assessment & Plan Note (Signed)
I cannot explain this. She is ANA and ssA positive and this shows lack of scleroderma which can cause dysphagia. She has not been prolonged prednisone that can cuase thrush. HEr hiatal hernia in 2013 ct chest is small. I will refer her to Dr Elnoria Howard her GI doc for the schanomma

## 2012-10-16 NOTE — Patient Instructions (Addendum)
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

## 2012-10-16 NOTE — Assessment & Plan Note (Signed)
5 day doxy course

## 2012-10-16 NOTE — Progress Notes (Signed)
Subjective:    Patient ID: Alison [name], female    DOB: Apr 21, 1956, 56 y.o.   MRN: [id number]  PCP Delorse Lek, MD  HPI IOV 01/30/2012 56 year old lady. Never smoker. 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 [employer].  States that in Jan 2013 reported bilateral insidious onset severe ankle pain that prevented her from walking and edema. Saw Dr Corliss Skains. Apparently blood work suggested sarcoidosis and prednisone started 4 weeks ago and with this ankle pain improved. As part of workup CXR and CT done: showed below abnormality below. 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 Denies rash on shin, palpitations but does have vision problems which she thinks is refraction related. Denies red eyes.  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 Denies family hx of sarcoidosis. Denies night fevers but does have night sweats on and off few months. Currently on pred taper for arthritis. Feels taper is making ankle pain return . She is keen to get quick diagnosis. Methotrexate under consideration but on hold till CT abnormalities sorted out.    Lab works2/6/13 with Dr Corliss Skains: Eso 1%, ESER 14, TSH normal 1.6, RF < 10, ANA 1:160 speckled,  HLA b27 - negative, normal UA, ccP < 2 and negative, ENA panel ssA, ssB, Smth, RNP, DS DNA all negative except ssA/Ro  slightly high at 42 . Lupous anticoagulant negative, Hep Virus panel negative. Normal complement . ACE 76. PPD read 01/09/12 - NEGATIVE  Ct Chest W Contrast  01/16/2012  *RADIOLOGY REPORT*  Clinical Data: Right hilar prominence noted on recent chest x-ray. Evaluate for possible sarcoidosis.  CT CHEST WITH CONTRAST  Technique:  Multidetector CT imaging of the chest was performed following the standard protocol during bolus administration of intravenous  contrast.  Contrast: 55mL OMNIPAQUE IOHEXOL 300 MG/ML IV SOLN  Comparison: Chest x-ray 01/09/2012.Chest CT 02/21/2009.  Findings:  Mediastinum: Heart size is normal. There is no significant pericardial fluid, thickening or pericardial calcification. Multiple borderline enlarged and mildly enlarged mediastinal and bilateral hilar lymph nodes.  The largest single mediastinal lymph node is a low right paratracheal node measuring 11 mm in short axis (image 19 of series 2). A prominent cluster of subcarinal lymph nodes measures up to the 2.1 x 1.5 cm. Largest conglomeration of hilar nodes is in the right hilar region measuring up to 1.9 x 2.3 cm.  Nodes have increased in number and size compared to prior examination from 02/21/2009. There is a small hiatal hernia.  Lungs/Pleura: There are a few small nodules associated with both major fissures (largest measures up to 4 mm in length in the right major fissure (image 29 of series 3)).  No architectural distortion and/or upward retraction of the hila on today's examination. Minimal dependent atelectasis in the right lower lobe.  No consolidative airspace disease.  No pleural effusions.  Upper Abdomen: Surgical changes adjacent to the cardia of the stomach, consistent with resection of previously noted gastric lesion.  Remainder of visualized upper abdomen is otherwise unremarkable.  Musculoskeletal: There are no aggressive appearing lytic or blastic lesions noted in the visualized portions of the skeleton.  IMPRESSION: 1.  Interval development of extensive mediastinal and bilateral hilar adenopathy, as detailed above.  This is nonspecific, but could certainly represent a manifestation of sarcoidosis.  Other differential  considerations would include lymphoma, however, given the absence of axillary adenopathy, this is less favored. 2.  There are several tiny nodules (all 4 mm or less in size) associated with the major fissures bilaterally.  These may simply represent  subpleural nodules as can be seen in the setting of sarcoidosis. However, they are nonspecific. If the patient is at high risk for bronchogenic carcinoma, follow-up chest CT at 1 year is recommended.  If the patient is at low risk, no follow-up is needed.   Original Report Authenticated By: Florencia Reasons, M.D.   [facility] I am going to discuss with Dr Delton Coombes about scheduling you for bronch biopsy  Based on that you might need PET scan  Please wait to hear more from [place]  MArch 2013 Phone Call Gave pet scan results - mediastinal node hot - needs eBUS. Small lung nodules  - doubt needs ENB. Will d/w MTOC this Thursday. For now try to set up for 3/27 wed or 3/28 thursdaay for EBUS but could change to ENB.  Let me know availability before confirming with OR/patient. Dr Herma Carson will come to asssist me  February 28, 2012 phone call EBUS [facility] 7 and 11L shows  Non necrotizing granuloma c/w sarcoid. Gave result to patient. 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 Currently advised to hold appt 03/21/12 but call several days before that to see if she needs to keep it or come in at later date. 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 3 weeks ago laryngitis that lasted 10 days and now just left with sinus pressure and some chest congestion with some cough that is productive of occ yellow sputum. She has only taken mucinex for this. Not taken antibiotics. No fever at anytime.     - Only other issue: is that for past several months feels food is stuck at that angle of lewis (she points to that area) and sometimes whne she eats something hot or cold willl have pain just to left of that area. Stable. Intermittent. Last episode yesterday. Each episode lasts approx 5 seconds. Not sought help for  this. Rates it as moderate in severity. Not associated with burp, vomit, gag, weight loss  - Of note, in June/July 2013 took prednisone for 6 weeks for arthritis and then since august 2013 she is taking MTX  20mg  once a week ofr ankle arthritis/arthralgia and this has helped. Currently  Ankles do not hurt but she has puffiness  Review of Systems  Constitutional: Negative for fever and unexpected weight change.  HENT: Positive for congestion and rhinorrhea. Negative for ear pain, nosebleeds, sore throat, sneezing, trouble swallowing, dental problem, postnasal drip and sinus pressure.   Eyes: Negative for redness and itching.  Respiratory: Positive for cough and shortness of breath. Negative for chest tightness and wheezing.   Cardiovascular: Negative for palpitations and leg swelling.  Gastrointestinal: Negative for nausea and vomiting.  Genitourinary: Negative for dysuria.  Musculoskeletal: Negative for joint swelling.  Skin: Negative for rash.  Neurological: Negative for headaches.  Hematological: Does not bruise/bleed easily.  Psychiatric/Behavioral: Negative for dysphoric mood. The patient is not nervous/anxious.    Current outpatient prescriptions:azelastine (ASTELIN) 137 MCG/SPRAY nasal spray, Place 1 spray into the nose daily. Use in  each nostril as directed, Disp: , Rfl: ;  cholecalciferol (VITAMIN D) 1000 UNITS tablet, Take 1,000 Units by mouth daily., Disp: , Rfl: ;  clonazePAM (KLONOPIN) 0.5 MG tablet, Take 0.25 mg by mouth daily. , Disp: , Rfl: ;  folic acid (FOLVITE) 1 MG tablet, Take 2 tablets by mouth Daily., Disp: , Rfl:  guaiFENesin (MUCINEX) 600 MG 12 hr tablet, Take 1,200 mg by mouth at bedtime as needed., Disp: , Rfl: ;  lovastatin (MEVACOR) 40 MG tablet, Take 20 mg by mouth at bedtime. , Disp: , Rfl: ;  methotrexate (RHEUMATREX) 2.5 MG tablet, Take 8 tablets by mouth once a week., Disp: , Rfl: ;  predniSONE (DELTASONE) 5 MG tablet, Take 5 mg by mouth Daily. , Disp: , Rfl:    valsartan-hydrochlorothiazide (DIOVAN-HCT) 320-25 MG per tablet, Take 0.5 tablets by mouth daily. , Disp: , Rfl:      Objective:   Physical Exam Vitals reviewed. Constitutional: She is oriented to person, place, and time. She appears well-developed and well-nourished. No distress.  HENT:  Head: Normocephalic and atraumatic.  Right Ear: External ear normal.  Left Ear: External ear normal.  Mouth/Throat: Oropharynx is clear and moist. No oropharyngeal exudate.  Eyes: Conjunctivae and EOM are normal. Pupils are equal, round, and reactive to light. Right eye exhibits no discharge. Left eye exhibits no discharge. No scleral icterus.  Neck: Normal range of motion. Neck supple. No JVD present. No tracheal deviation present. No thyromegaly present.  Cardiovascular: Normal rate, regular rhythm, normal heart sounds and intact distal pulses.  Exam reveals no gallop and no friction rub.   No murmur heard. Pulmonary/Chest: Effort normal and breath sounds normal. No respiratory distress. She has no wheezes. She has no rales. She exhibits no tenderness.  Abdominal: Soft. Bowel sounds are normal. She exhibits no distension and no mass. There is no tenderness. There is no rebound and no guarding.  Musculoskeletal: Normal range of motion. She exhibits no edema and no tenderness.  Lymphadenopathy:    She has no cervical adenopathy.  Neurological: She is alert and oriented to person, place, and time. She has normal reflexes. No cranial nerve deficit. She exhibits normal muscle tone. Coordination normal.  Skin: Skin is warm and dry. No rash noted. She is not diaphoretic. No erythema. No pallor.  Psychiatric: She has a normal mood and affect. Her behavior is normal. Judgment and thought content normal.             Assessment & Plan:

## 2012-10-16 NOTE — Assessment & Plan Note (Addendum)
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

## 2012-11-11 ENCOUNTER — Telehealth: Payer: Self-pay | Admitting: Internal Medicine

## 2012-11-11 MED ORDER — AZITHROMYCIN 250 MG PO TABS: ORAL_TABLET | ORAL | Status: DC

## 2012-11-11 NOTE — Telephone Encounter (Signed)
Spoke with pt is still having sinus drainage,head congestion productive cough  denies any sob, wheezing at this time . Requesting a zpak to help get her over this.  MR is out of the office  Dr Kriste Basque please advise.  Thank you  No Known Allergies

## 2012-11-11 NOTE — Telephone Encounter (Signed)
Rx has been sent in per SN, pt aware.

## 2012-11-11 NOTE — Telephone Encounter (Signed)
Per SN---ok to call in zpak #1  Take as directed with no refills.  thanks

## 2013-01-13 ENCOUNTER — Ambulatory Visit (INDEPENDENT_AMBULATORY_CARE_PROVIDER_SITE_OTHER)
Admission: RE | Admit: 2013-01-13 | Discharge: 2013-01-13 | Disposition: A | Discharge status: Home / Self Care | Payer: BC Managed Care – PPO | Source: Ambulatory Visit | Attending: Internal Medicine | Admitting: Internal Medicine

## 2013-01-13 DIAGNOSIS — R590 Localized enlarged lymph nodes: Secondary | ICD-10-CM

## 2013-01-13 DIAGNOSIS — R599 Enlarged lymph nodes, unspecified: Secondary | ICD-10-CM

## 2013-01-13 DIAGNOSIS — R1310 Dysphagia, unspecified: Secondary | ICD-10-CM

## 2013-01-13 DIAGNOSIS — R911 Solitary pulmonary nodule: Secondary | ICD-10-CM

## 2013-01-13 MED ORDER — IOHEXOL 300 MG/ML  SOLN
80.0000 mL | Freq: Once | INTRAMUSCULAR | Status: AC | PRN
  Administered 2013-01-13: 80 mL via INTRAVENOUS

## 2013-01-19 ENCOUNTER — Ambulatory Visit (INDEPENDENT_AMBULATORY_CARE_PROVIDER_SITE_OTHER): Payer: BC Managed Care – PPO | Admitting: Internal Medicine

## 2013-01-19 ENCOUNTER — Encounter: Payer: Self-pay | Admitting: Internal Medicine

## 2013-01-19 VITALS — BP 118/78 | HR 81 | Temp 98.1°F | Ht 66.5 in | Wt 162.2 lb

## 2013-01-19 DIAGNOSIS — R05 Cough: Secondary | ICD-10-CM

## 2013-01-19 DIAGNOSIS — R053 Chronic cough: Secondary | ICD-10-CM

## 2013-01-19 DIAGNOSIS — D869 Sarcoidosis, unspecified: Secondary | ICD-10-CM

## 2013-01-19 DIAGNOSIS — J99 Respiratory disorders in diseases classified elsewhere: Secondary | ICD-10-CM

## 2013-01-19 DIAGNOSIS — D860 Sarcoidosis of lung: Secondary | ICD-10-CM

## 2013-01-19 DIAGNOSIS — R059 Cough, unspecified: Secondary | ICD-10-CM

## 2013-01-19 NOTE — Progress Notes (Signed)
Subjective:    Patient ID: Alison [name], female    DOB: 06-14-1956, 57 y.o.   MRN: [id number]  HPI IOV 01/30/2012 57 year old lady. Never smoker. 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 TEaches kinder music.  States that in Jan 2013 reported bilateral insidious onset severe ankle pain that prevented her from walking and edema. Saw Dr Corliss Skains. Apparently blood work suggested sarcoidosis and prednisone started 4 weeks ago and with this ankle pain improved. As part of workup CXR and CT done: showed below abnormality below. 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 Denies rash on shin, palpitations but does have vision problems which she thinks is refraction related. Denies red eyes.  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 Denies family hx of sarcoidosis. Denies night fevers but does have night sweats on and off few months. Currently on pred taper for arthritis. Feels taper is making ankle pain return . She is keen to get quick diagnosis. Methotrexate under consideration but on hold till CT abnormalities sorted out.    Lab works2/6/13 with Dr Corliss Skains: Eso 1%, ESER 14, TSH normal 1.6, RF < 10, ANA 1:160 speckled,  HLA b27 - negative, normal UA, ccP < 2 and negative, ENA panel ssA, ssB, Smth, RNP, DS DNA all negative except ssA/Ro  slightly high at 42 . Lupous anticoagulant negative, Hep Virus panel negative. Normal complement . ACE 76. PPD read 01/09/12 - NEGATIVE  Ct Chest W Contrast  01/16/2012  *RADIOLOGY REPORT*  Clinical Data: Right hilar prominence noted on recent chest x-ray. Evaluate for possible sarcoidosis.  CT CHEST WITH CONTRAST  Technique:  Multidetector CT imaging of the chest was performed following the standard protocol during bolus administration of intravenous contrast.  Contrast: 55mL  OMNIPAQUE IOHEXOL 300 MG/ML IV SOLN  Comparison: Chest x-ray 01/09/2012.Chest CT 02/21/2009.  Findings:  Mediastinum: Heart size is normal. There is no significant pericardial fluid, thickening or pericardial calcification. Multiple borderline enlarged and mildly enlarged mediastinal and bilateral hilar lymph nodes.  The largest single mediastinal lymph node is a low right paratracheal node measuring 11 mm in short axis (image 19 of series 2). A prominent cluster of subcarinal lymph nodes measures up to the 2.1 x 1.5 cm. Largest conglomeration of hilar nodes is in the right hilar region measuring up to 1.9 x 2.3 cm.  Nodes have increased in number and size compared to prior examination from 02/21/2009. There is a small hiatal hernia.  Lungs/Pleura: There are a few small nodules associated with both major fissures (largest measures up to 4 mm in length in the right major fissure (image 29 of series 3)).  No architectural distortion and/or upward retraction of the hila on today's examination. Minimal dependent atelectasis in the right lower lobe.  No consolidative airspace disease.  No pleural effusions.  Upper Abdomen: Surgical changes adjacent to the cardia of the stomach, consistent with resection of previously noted gastric lesion.  Remainder of visualized upper abdomen is otherwise unremarkable.  Musculoskeletal: There are no aggressive appearing lytic or blastic lesions noted in the visualized portions of the skeleton.  IMPRESSION: 1.  Interval development of extensive mediastinal and bilateral hilar adenopathy, as detailed above.  This is nonspecific, but could certainly represent a manifestation of sarcoidosis.  Other differential considerations would include lymphoma, however,  given the absence of axillary adenopathy, this is less favored. 2.  There are several tiny nodules (all 4 mm or less in size) associated with the major fissures bilaterally.  These may simply represent subpleural nodules as can be seen  in the setting of sarcoidosis. However, they are nonspecific. If the patient is at high risk for bronchogenic carcinoma, follow-up chest CT at 1 year is recommended.  If the patient is at low risk, no follow-up is needed.   Original Report Authenticated By: Florencia Reasons, M.D.   [facility] I am going to discuss with Dr Delton Coombes about scheduling you for bronch biopsy  Based on that you might need PET scan  Please wait to hear more from [place]  MArch 2013 Phone Call Gave pet scan results - mediastinal node hot - needs eBUS. Small lung nodules  - doubt needs ENB. Will d/w MTOC this Thursday. For now try to set up for 3/27 wed or 3/28 thursdaay for EBUS but could change to ENB.  Let me know availability before confirming with OR/patient. Dr Herma Carson will come to asssist me  February 28, 2012 phone call EBUS Station 7 and 11L shows  Non necrotizing granuloma c/w sarcoid. Gave result to patient. 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 Currently advised to hold appt 03/21/12 but call several days before that to see if she needs to keep it or come in at later date. 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 3 weeks ago laryngitis that lasted 10 days and now just left with sinus pressure and some chest congestion with some cough that is productive of occ yellow sputum. She has only taken mucinex for this. Not taken antibiotics. No fever at anytime.     - Only other issue: is that for past several months feels food is stuck at that angle of lewis (she points to that area) and sometimes whne she eats something hot or cold willl have pain just to left of that area. Stable. Intermittent. Last episode yesterday. Each episode lasts approx 5 seconds. Not sought help for this. Rates it as moderate in  severity. Not associated with burp, vomit, gag, weight loss  - Of note, in June/July 2013 took prednisone for 6 weeks for arthritis and then since august 2013 she is taking MTX  20mg  once a week ofr ankle arthritis/arthralgia and this has helped. 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 Her tiny nodules which are likely intrapulmonary  lymph nodes are unchanged. However her main issues cough that is chronic.  COugh x since 10/03/12 following viral illness. Mid November I gave you doxycycline. Mid-December ScottNadel gave Zpak. AFter this bronchitis symptoms got better but cough never resolved. Cough severity is 4 of 10 (but was 10 in dec 2013 prior to zpak). Cough usually dry but occ. Brings out globules of green sputum/tannish sputum. Cough associated with sinus drainage x 1 day only. In terms of GI: dysphagia is resolved with ppi (she never ended up seeing Dr Elnoria Howard because PPI helped). She continues on prilosec. In terms of CT chest: hilar nodes/mediasitinal nodes are all > 50% better. Tiny nodules are stable.  In terms of sarcoid arthropathy": mostly under remission iwht methotrexate.      IMPRESSION:  CT chest 01/13/13 1. No acute cardiopulmonary abnormalities.  2. Tiny nodules are not significantly changed from previous exam.   3. Improvement and mediastinal adenopathy.  4. Multiple tiny low attenuation structures within the splenic  parenchyma are favored to represent multiple granulomas.   Heart/Mediastinum: Normal heart size. No pericardial effusion.  Right paratracheal lymph node measures 7 mm, image 20. Previously  1.1 cm. Low right paratracheal lymph node measures 8 mm, image 23.  Previously 1.2 cm. The subcarinal lymph node now measures 7 mm,  image 26. Previously 1.5 cm.   Original Report Authenticated By: Signa Kell, M.D.    Review of Systems  Constitutional: Negative for fever and unexpected weight change.  HENT: Positive for congestion and postnasal drip. Negative for ear pain, nosebleeds, sore throat, rhinorrhea, sneezing, trouble swallowing, dental problem and sinus pressure.   Eyes: Negative for redness and itching.  Respiratory: Positive for cough. Negative for chest tightness, shortness of breath and wheezing.   Cardiovascular: Negative for palpitations and leg swelling.  Gastrointestinal: Negative for nausea and vomiting.  Genitourinary: Negative for dysuria.  Musculoskeletal: Negative for joint swelling.  Skin: Negative for rash.  Neurological: Negative for headaches.  Hematological: Does not bruise/bleed easily.  Psychiatric/Behavioral: Negative for dysphoric mood. The patient is not nervous/anxious.    Current outpatient prescriptions:azelastine (ASTELIN) 137 MCG/SPRAY nasal spray, Place 1 spray into the nose daily. Use in each nostril as directed, Disp: , Rfl: ;  cholecalciferol (VITAMIN D) 1000 UNITS tablet, Take 1,000 Units by mouth daily., Disp: , Rfl: ;  clonazePAM (KLONOPIN) 0.5 MG tablet, Take 0.25 mg by mouth daily. , Disp: , Rfl: ;  folic acid (FOLVITE) 1 MG tablet, Take 2 tablets by mouth Daily., Disp: , Rfl:  guaiFENesin (MUCINEX) 600 MG 12 hr tablet, Take 1,200 mg by mouth at bedtime as needed., Disp: , Rfl: ;  lovastatin (MEVACOR) 40 MG tablet, Take 20 mg by mouth at  bedtime. , Disp: , Rfl: ;  methotrexate (RHEUMATREX) 2.5 MG tablet, Take 8 tablets by mouth once a week., Disp: , Rfl: ;  omeprazole (PRILOSEC) 20 MG capsule, Take 20 mg by mouth every other day., Disp: , Rfl:  valsartan-hydrochlorothiazide (DIOVAN-HCT) 320-25 MG per tablet, Take 0.5 tablets by mouth daily. , Disp: , Rfl:   Past Medical History  Diagnosis Date  . Chest pain   . Hypertension   . Hyperlipidemia   . RLS (restless legs syndrome)   . Endometriosis   . Inflammatory arthritis   . Vitamin D deficiency   . Anemia     hx of due to heavy menstral cycles  . GERD (gastroesophageal reflux disease)   . Headache     occassional headaches  . Mediastinal adenopathy  March 2013    PET HOT, High ACE level, EBUS 02/26/12     Family History  Problem Relation Age of Onset  . Asthma Mother   . Cancer Maternal Grandfather   . Diabetes Mother     and sister, MGM  . Heart disease Father   . Hypertension Paternal Grandmother   . Heart attack Father   . Hypertension Father      History   Social History  . Marital Status: Married    Spouse Name: N/A    Number of Children: 1  . Years of Education: N/A   Occupational History  . music teacher    Social History Main Topics  . Smoking status: Never Smoker   . Smokeless tobacco: Not on file  . Alcohol Use: 1.5 oz/week    3 drink(s) per week  . Drug Use: No  . Sexually Active: Not on file   Other Topics Concern  . Not on file   Social History Narrative  . No narrative on file     No Known Allergies   Outpatient Prescriptions Prior to Visit  Medication Sig Dispense Refill  . azelastine (ASTELIN) 137 MCG/SPRAY nasal spray Place 1 spray into the nose daily. Use in each nostril as directed      . cholecalciferol (VITAMIN D) 1000 UNITS tablet Take 1,000 Units by mouth daily.      . clonazePAM (KLONOPIN) 0.5 MG tablet Take 0.25 mg by mouth daily.       . folic acid (FOLVITE) 1 MG tablet Take 2 tablets by mouth Daily.      Marland Kitchen  guaiFENesin (MUCINEX) 600 MG 12 hr tablet Take 1,200 mg by mouth at bedtime as needed.      . lovastatin (MEVACOR) 40 MG tablet Take 20 mg by mouth at bedtime.       . methotrexate (RHEUMATREX) 2.5 MG tablet Take 8 tablets by mouth once a week.      . valsartan-hydrochlorothiazide (DIOVAN-HCT) 320-25 MG per tablet Take 0.5 tablets by mouth daily.       Marland Kitchen azithromycin (ZITHROMAX Z-PAK) 250 MG tablet Take as directed  6 each  0  . doxycycline (VIBRA-TABS) 100 MG tablet Take 1 tablet (100 mg total) by mouth 2 (two) times daily. Take after meals and avoid sunlight.  10 tablet  0  . predniSONE (DELTASONE) 5 MG tablet Take 5 mg by mouth Daily.        No facility-administered medications prior to visit.         Objective:   Physical Exam Vitals reviewed. Constitutional: She is oriented to person, place, and time. She appears well-developed and well-nourished. No distress.  HENT:  Head: Normocephalic and atraumatic.  Right Ear: External ear normal.  Left Ear: External ear normal.  Mouth/Throat: Oropharynx is clear and moist. No oropharyngeal exudate.  Eyes: Conjunctivae and EOM are normal. Pupils are equal, round, and reactive to light. Right eye exhibits no discharge. Left eye exhibits no discharge. No scleral icterus.  Neck: Normal range of motion. Neck supple. No JVD present. No tracheal deviation present. No thyromegaly present.  Cardiovascular: Normal rate, regular rhythm, normal heart sounds and intact distal pulses.  Exam reveals no gallop and no friction rub.   No murmur heard. Pulmonary/Chest: Effort normal and breath sounds normal. No respiratory distress. She has no wheezes. She has no rales. She exhibits no tenderness.  Abdominal: Soft. Bowel sounds are normal. She exhibits no distension and no mass. There is no tenderness.  There is no rebound and no guarding.  Musculoskeletal: Normal range of motion. She exhibits no edema and no tenderness.  Lymphadenopathy:    She has no cervical  adenopathy.  Neurological: She is alert and oriented to person, place, and time. She has normal reflexes. No cranial nerve deficit. She exhibits normal muscle tone. Coordination normal.  Skin: Skin is warm and dry. No rash noted. She is not diaphoretic. No erythema. No pallor.  Psychiatric: She has a normal mood and affect. Her behavior is normal. Judgment and thought content normal.              Assessment & Plan:

## 2013-01-19 NOTE — Patient Instructions (Addendum)
#  Pulmonary stage I Sarcoidosis and tiny pulmonary nodule NOS - The lymph glands in her chest a significantly reduced in February 2014 by at least 50% compared to February 2013 -The tiny nodules is unchanged in size and probably represent lymph gland - No further followup needed for this - No treatment needed   #Chronic cough - Most likely have post viral reactive cough that likely take a long time to go away - Cough probably being kept alive by sinus drainage and by singing and possibly by acid reflux - Continue acid reflux treatment - Start 3% hypertonic saline nasal spray by Heide Spark or Nettie pot saline sinus wash once a day and night for the next one month -  take generic fluticasone inhaler 2 squirts each nostril daily -Also take 2 days completely off for voice rest which means no whispering or talking - At anytime there is urge to cough please suck on sugarless lozenge or drink water - If above measures fails you can call us and we can try Neurontin treatment  #Followup - As needed if cough does not go away

## 2013-01-22 DIAGNOSIS — R053 Chronic cough: Secondary | ICD-10-CM | POA: Insufficient documentation

## 2013-01-22 NOTE — Assessment & Plan Note (Signed)
#  Chronic cough - Most likely have post viral reactive cough that likely take a long time to go away - Cough probably being kept alive by sinus drainage and by singing and possibly by acid reflux - Continue acid reflux treatment - Start 3% hypertonic saline nasal spray by Heide Spark or Nettie pot saline sinus wash once a day and night for the next one month -  take generic fluticasone inhaler 2 squirts each nostril daily -Also take 2 days completely off for voice rest which means no whispering or talking - At anytime there is urge to cough please suck on sugarless lozenge or drink water - If above measures fails you can call us and we can try Neurontin treatment  #Followup - As needed if cough does not go away

## 2013-01-22 NOTE — Assessment & Plan Note (Signed)
#  Pulmonary stage I Sarcoidosis and tiny pulmonary nodule NOS - The lymph glands in her chest a significantly reduced in February 2014 by at least 50% compared to February 2013 -The tiny nodules is unchanged in size and probably represent lymph gland - No further followup needed for this - No treatment needed for pulmonary sarcoid; will leave treatment of arthropathy to Dr. Pollyann Savoy

## 2013-06-19 ENCOUNTER — Other Ambulatory Visit (HOSPITAL_COMMUNITY): Payer: Self-pay | Admitting: Obstetrics and Gynecology

## 2013-06-19 DIAGNOSIS — Z1231 Encounter for screening mammogram for malignant neoplasm of breast: Secondary | ICD-10-CM

## 2013-07-09 ENCOUNTER — Ambulatory Visit (HOSPITAL_COMMUNITY)
Admission: RE | Admit: 2013-07-09 | Discharge: 2013-07-09 | Disposition: A | Discharge status: Home / Self Care | Payer: BC Managed Care – PPO | Source: Ambulatory Visit | Attending: Obstetrics and Gynecology | Admitting: Obstetrics and Gynecology

## 2013-07-09 DIAGNOSIS — Z1231 Encounter for screening mammogram for malignant neoplasm of breast: Secondary | ICD-10-CM | POA: Insufficient documentation

## 2013-11-19 ENCOUNTER — Other Ambulatory Visit: Payer: Self-pay | Admitting: Sports Medicine

## 2013-11-19 ENCOUNTER — Ambulatory Visit
Admission: RE | Admit: 2013-11-19 | Discharge: 2013-11-19 | Disposition: A | Discharge status: Home / Self Care | Payer: BC Managed Care – PPO | Source: Ambulatory Visit | Attending: Sports Medicine | Admitting: Sports Medicine

## 2013-11-19 DIAGNOSIS — S82202B Unspecified fracture of shaft of left tibia, initial encounter for open fracture type I or II: Secondary | ICD-10-CM

## 2014-09-21 ENCOUNTER — Other Ambulatory Visit (HOSPITAL_COMMUNITY): Payer: Self-pay | Admitting: Obstetrics and Gynecology

## 2014-09-21 DIAGNOSIS — Z1231 Encounter for screening mammogram for malignant neoplasm of breast: Secondary | ICD-10-CM

## 2014-09-29 ENCOUNTER — Ambulatory Visit (HOSPITAL_COMMUNITY)
Admission: RE | Admit: 2014-09-29 | Discharge: 2014-09-29 | Disposition: A | Discharge status: Home / Self Care | Payer: BC Managed Care – PPO | Source: Ambulatory Visit | Attending: Obstetrics and Gynecology | Admitting: Obstetrics and Gynecology

## 2014-09-29 DIAGNOSIS — Z1231 Encounter for screening mammogram for malignant neoplasm of breast: Secondary | ICD-10-CM | POA: Diagnosis not present

## 2015-02-15 ENCOUNTER — Ambulatory Visit (HOSPITAL_COMMUNITY)
Admission: RE | Admit: 2015-02-15 | Discharge: 2015-02-15 | Disposition: A | Discharge status: Home / Self Care | Payer: BLUE CROSS/BLUE SHIELD | Source: Ambulatory Visit | Attending: Rheumatology | Admitting: Rheumatology

## 2015-02-15 ENCOUNTER — Other Ambulatory Visit (HOSPITAL_COMMUNITY): Payer: Self-pay | Admitting: Rheumatology

## 2015-02-15 DIAGNOSIS — J40 Bronchitis, not specified as acute or chronic: Secondary | ICD-10-CM

## 2015-02-15 DIAGNOSIS — R0989 Other specified symptoms and signs involving the circulatory and respiratory systems: Secondary | ICD-10-CM | POA: Insufficient documentation

## 2016-10-08 ENCOUNTER — Telehealth: Payer: Self-pay | Admitting: Radiology

## 2016-10-08 NOTE — Telephone Encounter (Signed)
Refill request received via fax for   Fosamax [facility]

## 2016-10-09 ENCOUNTER — Telehealth: Payer: Self-pay | Admitting: Radiology

## 2016-10-09 MED ORDER — ALENDRONATE SODIUM 70 MG PO TABS: 70.0000 mg | ORAL_TABLET | ORAL | 0 refills | Status: DC

## 2016-10-09 NOTE — Telephone Encounter (Signed)
Last visit 08/30/16 Next visit not scheduled, sent message  Labs 08/30/16  Ok to refill per Dr Estanislado Pandy

## 2016-10-09 NOTE — Telephone Encounter (Signed)
Called patient to schedule, patient states she will call back when she has a calendar available.

## 2016-10-09 NOTE — Telephone Encounter (Signed)
Patient needs follow up appt with Dr Deveshwar pls call to make appt. Feb/ March  

## 2016-12-01 ENCOUNTER — Other Ambulatory Visit: Payer: Self-pay | Admitting: Rheumatology

## 2016-12-04 ENCOUNTER — Telehealth: Payer: Self-pay | Admitting: Rheumatology

## 2016-12-04 NOTE — Telephone Encounter (Signed)
Last visit 08/30/16 Next visit not scheduled, sent message again  Labs 08/30/16 very past due, will advise patient

## 2016-12-04 NOTE — Telephone Encounter (Signed)
-----   Message from Candice Camp, RT sent at 12/04/2016  8:43 AM EST ----- Patient needs follow up appt with Dr Estanislado Pandy pls call to make appt. Feb

## 2016-12-04 NOTE — Telephone Encounter (Signed)
LMOM for patient to call back to schedule appointment.  

## 2017-01-20 ENCOUNTER — Other Ambulatory Visit: Payer: Self-pay | Admitting: Rheumatology

## 2017-01-21 NOTE — Telephone Encounter (Signed)
Last Visit: 08/30/16 Next Visit due February 2018. Message sent to the front to schedule patient.  Labs: 08/30/16 ALT 34  Okay to refill Fosamax?

## 2017-01-22 ENCOUNTER — Telehealth: Payer: Self-pay | Admitting: Rheumatology

## 2017-01-22 NOTE — Telephone Encounter (Signed)
Called patient to schedule her follow up.  She stated that she did not have her calendar with her and that she would need to call us to schedule her appointment.

## 2017-01-30 ENCOUNTER — Other Ambulatory Visit: Payer: Self-pay | Admitting: Rheumatology

## 2017-01-30 LAB — CBC WITH DIFFERENTIAL/PLATELET
Basophils Absolute: 0 cells/uL (ref 0–200)
Basophils Relative: 0 %
Eosinophils Absolute: 264 cells/uL (ref 15–500)
Eosinophils Relative: 3 %
HCT: 39.8 % (ref 35.0–45.0)
Hemoglobin: 13.2 g/dL (ref 11.7–15.5)
Lymphocytes Relative: 23 %
Lymphs Abs: 2024 cells/uL (ref 850–3900)
MCH: 29.8 pg (ref 27.0–33.0)
MCHC: 33.2 g/dL (ref 32.0–36.0)
MCV: 89.8 fL (ref 80.0–100.0)
MPV: 11 fL (ref 7.5–12.5)
Monocytes Absolute: 528 cells/uL (ref 200–950)
Monocytes Relative: 6 %
Neutro Abs: 5984 cells/uL (ref 1500–7800)
Neutrophils Relative %: 68 %
Platelets: 339 10*3/uL (ref 140–400)
RBC: 4.43 MIL/uL (ref 3.80–5.10)
RDW: 13.7 % (ref 11.0–15.0)
WBC: 8.8 10*3/uL (ref 3.8–10.8)

## 2017-01-31 LAB — COMPLETE METABOLIC PANEL WITH GFR
ALT: 29 U/L (ref 6–29)
AST: 25 U/L (ref 10–35)
Albumin: 3.9 g/dL (ref 3.6–5.1)
Alkaline Phosphatase: 75 U/L (ref 33–130)
BUN: 8 mg/dL (ref 7–25)
CO2: 25 mmol/L (ref 20–31)
Calcium: 9.9 mg/dL (ref 8.6–10.4)
Chloride: 101 mmol/L (ref 98–110)
Creat: 0.7 mg/dL (ref 0.50–0.99)
GFR, Est African American: >89 mL/min (ref >=60)
GFR, Est Non African American: >89 mL/min (ref >=60)
Glucose, Bld: 159 mg/dL — ABNORMAL HIGH (ref 65–99)
Potassium: 4.1 mmol/L (ref 3.5–5.3)
Sodium: 141 mmol/L (ref 135–146)
Total Bilirubin: 0.7 mg/dL (ref 0.2–1.2)
Total Protein: 6.3 g/dL (ref 6.1–8.1)

## 2017-01-31 LAB — VITAMIN D 25 HYDROXY (VIT D DEFICIENCY, FRACTURES): Vit D, 25-Hydroxy: 56 ng/mL (ref 30–100)

## 2017-01-31 NOTE — Progress Notes (Signed)
Elevated glucose

## 2017-02-06 ENCOUNTER — Other Ambulatory Visit: Payer: Self-pay | Admitting: Rheumatology

## 2017-02-07 ENCOUNTER — Telehealth: Payer: Self-pay | Admitting: Rheumatology

## 2017-02-07 NOTE — Telephone Encounter (Signed)
-----   Message from Carole Binning, LPN sent at 07/11/7846  8:48 AM EST ----- Regarding: Please schedule patient for follow up visit Please schedule patient for follow up visit. Patient was due in February 2018. Thanks!

## 2017-02-07 NOTE — Telephone Encounter (Signed)
Patient is scheduled for April 17

## 2017-02-07 NOTE — Telephone Encounter (Signed)
Last Visit: 08/30/16 Next Visit due February 2018. Message sent to the front to schedule patient.  Labs: 01/30/17 Elevated glucose   Okay to refill MTX?

## 2017-03-11 DIAGNOSIS — R768 Other specified abnormal immunological findings in serum: Secondary | ICD-10-CM | POA: Insufficient documentation

## 2017-03-11 DIAGNOSIS — R748 Abnormal levels of other serum enzymes: Secondary | ICD-10-CM | POA: Insufficient documentation

## 2017-03-11 NOTE — Progress Notes (Deleted)
Office Visit Note  Patient: Alison [name]             Date of Birth: Apr 08, 1956           MRN: [id number]             PCP: Stephens Shire, MD Referring: Stephens Shire, MD Visit Date: 03/19/2017 Occupation: _0 @    Subjective:  No chief complaint on file.   History of Present Illness: Alison [name] is a 61 y.o. female ***   Activities of Daily Living:  Patient reports morning stiffness for *** {minute/hour:19697}.   Patient {ACTIONS;DENIES/REPORTS:21021675::"Denies"} nocturnal pain.  Difficulty dressing/grooming: {ACTIONS;DENIES/REPORTS:21021675::"Denies"} Difficulty climbing stairs: {ACTIONS;DENIES/REPORTS:21021675::"Denies"} Difficulty getting out of chair: {ACTIONS;DENIES/REPORTS:21021675::"Denies"} Difficulty using hands for taps, buttons, cutlery, and/or writing: {ACTIONS;DENIES/REPORTS:21021675::"Denies"}   No Rheumatology ROS completed.   PMFS History:  Patient Active Problem List   Diagnosis Date Noted  . Hilar lymphadenopathy 03/16/2017  . High risk medication use 03/16/2017  . Age-related osteoporosis without current pathological fracture 03/16/2017  . ANA positive 03/11/2017  . Abnormal serum angiotensin-converting enzyme level 03/11/2017  . Chronic cough 01/22/2013  . Dysphagia 10/16/2012  . Sarcoidosis, lung, stage I 02/04/2012  . At risk for coronary artery disease 04/25/2011  . Endometriosis   . Hypertension   . Hyperlipidemia   . RLS (restless legs syndrome)     Past Medical History:  Diagnosis Date  . Anemia    hx of due to heavy menstral cycles  . Chest pain   . Endometriosis   . GERD (gastroesophageal reflux disease)   . Headache    occassional headaches  . Hyperlipidemia   . Hypertension   . Inflammatory arthritis   . Mediastinal adenopathy March 2013   PET HOT, High ACE level, EBUS 02/26/12  . RLS (restless legs syndrome)   . Vitamin D deficiency     Family History  Problem Relation Age of Onset  . Asthma Mother   .  Cancer Maternal Grandfather   . Diabetes Mother     and sister, MGM  . Heart disease Father   . Hypertension Paternal Grandmother   . Heart attack Father   . Hypertension Father    Past Surgical History:  Procedure Laterality Date  . CESAREAN SECTION    . ENDOMETRIAL BIOPSY    . FINGER SURGERY     x2  . TONSILLECTOMY    . TUMOR REMOVAL     gastric schwannoma   Social History   Social History Narrative  . No narrative on file     Objective: Vital Signs: There were no vitals taken for this visit.   Physical Exam   Musculoskeletal Exam: ***  CDAI Exam: No CDAI exam completed.    Investigation: Findings:  bone density in July 2017, which was reviewed.  Her T-score was -2.7, which is down from previous one by -6%, which is a significant change.   .  Sarcoidosis.  Positive ANA, Ro and ACE.  No shortness of breath.  No change in symptoms.  She is seeing Dr. Chase Caller for sarcoidosis.    Labs from January 09, 2012 shows lupus anticoagulant, hepatitis acute panel, C3-C4, SPEP, M spike, beta-2 glycoprotein and anti-cardiolipin are all negative. ENA is normal except rho is increased to 42.  On January 01, 2012 labs ANA is positive with a titer of 1:160. CCP, rheumatoid factor and HLA-B27 is negative. Vitamin D was decreased and ACE was increased to 76.    Orders Only on 01/30/2017  Component  Date Value Ref Range Status  . Sodium 01/30/2017 141  135 - 146 mmol/L Final  . Potassium 01/30/2017 4.1  3.5 - 5.3 mmol/L Final  . Chloride 01/30/2017 101  98 - 110 mmol/L Final  . CO2 01/30/2017 25  20 - 31 mmol/L Final  . Glucose, Bld 01/30/2017 159* 65 - 99 mg/dL Final  . BUN 01/30/2017 8  7 - 25 mg/dL Final  . Creat 01/30/2017 0.70  0.50 - 0.99 mg/dL Final   Comment:   For patients > or = 62 years of age: The upper reference limit for Creatinine is approximately 13% higher for people identified as African-American.     . Total Bilirubin 01/30/2017 0.7  0.2 - 1.2 mg/dL Final    . Alkaline Phosphatase 01/30/2017 75  33 - 130 U/L Final  . AST 01/30/2017 25  10 - 35 U/L Final  . ALT 01/30/2017 29  6 - 29 U/L Final  . Total Protein 01/30/2017 6.3  6.1 - 8.1 g/dL Final  . Albumin 01/30/2017 3.9  3.6 - 5.1 g/dL Final  . Calcium 01/30/2017 9.9  8.6 - 10.4 mg/dL Final  . GFR, Est African American 01/30/2017 >89  >=60 mL/min Final  . GFR, Est Non African American 01/30/2017 >89  >=60 mL/min Final  . WBC 01/30/2017 8.8  3.8 - 10.8 K/uL Final  . RBC 01/30/2017 4.43  3.80 - 5.10 MIL/uL Final  . Hemoglobin 01/30/2017 13.2  11.7 - 15.5 g/dL Final  . HCT 01/30/2017 39.8  35.0 - 45.0 % Final  . MCV 01/30/2017 89.8  80.0 - 100.0 fL Final  . MCH 01/30/2017 29.8  27.0 - 33.0 pg Final  . MCHC 01/30/2017 33.2  32.0 - 36.0 g/dL Final  . RDW 01/30/2017 13.7  11.0 - 15.0 % Final  . Platelets 01/30/2017 339  140 - 400 K/uL Final  . MPV 01/30/2017 11.0  7.5 - 12.5 fL Final  . Neutro Abs 01/30/2017 5984  1,500 - 7,800 cells/uL Final  . Lymphs Abs 01/30/2017 2024  850 - 3,900 cells/uL Final  . Monocytes Absolute 01/30/2017 528  200 - 950 cells/uL Final  . Eosinophils Absolute 01/30/2017 264  15 - 500 cells/uL Final  . Basophils Absolute 01/30/2017 0  0 - 200 cells/uL Final  . Neutrophils Relative % 01/30/2017 68  % Final  . Lymphocytes Relative 01/30/2017 23  % Final  . Monocytes Relative 01/30/2017 6  % Final  . Eosinophils Relative 01/30/2017 3  % Final  . Basophils Relative 01/30/2017 0  % Final  . Smear Review 01/30/2017 Criteria for review not met   Final  . Vit D, 25-Hydroxy 01/30/2017 56  30 - 100 ng/mL Final   Comment: Vitamin D Status           25-OH Vitamin D        Deficiency                <20 ng/mL        Insufficiency         20 - 29 ng/mL        Optimal             > or = 30 ng/mL   For 25-OH Vitamin D testing on patients on D2-supplementation and patients for whom quantitation of D2 and D3 fractions is required, the QuestAssureD 25-OH VIT D, (D2,D3), LC/MS/MS is  recommended: order code [id number] (patients > 2 yrs).      Imaging: No results  found.  Speciality Comments: No specialty comments available.    Procedures:  No procedures performed Allergies: Patient has no known allergies.   Assessment / Plan:     Visit Diagnoses: Sarcoidosis, lung, stage I - Elevated ace  Hilar lymphadenopathy - With caseating granuloma followed up by Dr. Chase Caller  ANA positive - positive Ro and positive ANA, mild sicca symptoms  High risk medication use - Methotrexate, folic acid, history of elevated LFTs in the past  Age-related osteoporosis without current pathological fracture - DEXA July 2017 T-score  -2.7, which is down from previous one by -6%, on Fosamax 70 mg by mouth every week  Essential hypertension  Mixed hyperlipidemia   She has Hypertension; Hyperlipidemia; RLS (restless legs syndrome); Endometriosis; At risk for coronary artery disease; Dysphagia; Acute bronchitis; and Chronic cough on her problem list.  Orders: No orders of the defined types were placed in this encounter.  No orders of the defined types were placed in this encounter.   Face-to-face time spent with patient was *** minutes. 50% of time was spent in counseling and coordination of care.  Follow-Up Instructions: No Follow-up on file.   Bo Merino, MD  Note - This record has been created using Editor, commissioning.  Chart creation errors have been sought, but may not always  have been located. Such creation errors do not reflect on  the standard of medical care.

## 2017-03-16 DIAGNOSIS — Z79899 Other long term (current) drug therapy: Secondary | ICD-10-CM | POA: Insufficient documentation

## 2017-03-16 DIAGNOSIS — R590 Localized enlarged lymph nodes: Secondary | ICD-10-CM | POA: Insufficient documentation

## 2017-03-16 DIAGNOSIS — M810 Age-related osteoporosis without current pathological fracture: Secondary | ICD-10-CM | POA: Insufficient documentation

## 2017-03-19 ENCOUNTER — Ambulatory Visit: Payer: Self-pay | Admitting: Rheumatology

## 2017-04-17 NOTE — Progress Notes (Deleted)
 Office Visit Note  Patient: Alison [name]             Date of Birth: 09/22/1956           MRN: [id number]             PCP: Burnett, Brent A, MD Referring: Burnett, Brent A, MD Visit Date: 04/25/2017 Occupation: @GUAROCC@    Subjective:  No chief complaint on file.   History of Present Illness: Alison [name] is a 60 y.o. female ***   Activities of Daily Living:  Patient reports morning stiffness for *** {minute/hour:19697}.   Patient {ACTIONS;DENIES/REPORTS:21021675::"Denies"} nocturnal pain.  Difficulty dressing/grooming: {ACTIONS;DENIES/REPORTS:21021675::"Denies"} Difficulty climbing stairs: {ACTIONS;DENIES/REPORTS:21021675::"Denies"} Difficulty getting out of chair: {ACTIONS;DENIES/REPORTS:21021675::"Denies"} Difficulty using hands for taps, buttons, cutlery, and/or writing: {ACTIONS;DENIES/REPORTS:21021675::"Denies"}   No Rheumatology ROS completed.   PMFS History:  Patient Active Problem List   Diagnosis Date Noted  . Hilar lymphadenopathy 03/16/2017  . High risk medication use 03/16/2017  . Age-related osteoporosis without current pathological fracture 03/16/2017  . ANA positive 03/11/2017  . Abnormal serum angiotensin-converting enzyme level 03/11/2017  . Chronic cough 01/22/2013  . Dysphagia 10/16/2012  . Sarcoidosis, lung, stage I 02/04/2012  . At risk for coronary artery disease 04/25/2011  . Endometriosis   . Hypertension   . Hyperlipidemia   . RLS (restless legs syndrome)     Past Medical History:  Diagnosis Date  . Anemia    hx of due to heavy menstral cycles  . Chest pain   . Endometriosis   . GERD (gastroesophageal reflux disease)   . Headache    occassional headaches  . Hyperlipidemia   . Hypertension   . Inflammatory arthritis   . Mediastinal adenopathy March 2013   PET HOT, High ACE level, EBUS 02/26/12  . RLS (restless legs syndrome)   . Vitamin D deficiency     Family History  Problem Relation Age of Onset  . Asthma Mother   .  Cancer Maternal Grandfather   . Diabetes Mother        and sister, MGM  . Heart disease Father   . Hypertension Paternal Grandmother   . Heart attack Father   . Hypertension Father    Past Surgical History:  Procedure Laterality Date  . CESAREAN SECTION    . ENDOMETRIAL BIOPSY    . FINGER SURGERY     x2  . TONSILLECTOMY    . TUMOR REMOVAL     gastric schwannoma   Social History   Social History Narrative  . No narrative on file     Objective: Vital Signs: There were no vitals taken for this visit.   Physical Exam   Musculoskeletal Exam: ***  CDAI Exam: No CDAI exam completed.    Investigation: Findings:  Labs from January 09, 2012 shows lupus anticoagulant, hepatitis acute panel, C3-C4, SPEP, M spike, beta-2 glycoprotein and anti-cardiolipin are all negative. ENA is normal except rho is increased to 42.  On January 01, 2012 labs ANA is positive with a titer of 1:160. CCP, rheumatoid factor and HLA-B27 is negative. Vitamin D was decreased and ACE was increased to 76.  An x-ray report from January 09, 2012 shows slight prominent right pulmonary hilar lymphadenopathy.   September 2017:  CBC was normal.  Comprehensive metabolic panel showed ALT of 34, which was slightly elevated and glucose was 120    CBC Latest Ref Rng & Units 01/30/2017 02/25/2012 04/30/2009  WBC 3.8 - 10.8 K/uL 8.8 11.1(H) 10.8(H)  Hemoglobin 11.7 -   15.5 g/dL 13.2 14.0 11.5(L)  Hematocrit 35.0 - 45.0 % 39.8 41.3 34.6(L)  Platelets 140 - 400 K/uL 339 309 288    CMP Latest Ref Rng & Units 01/30/2017 02/25/2012 04/30/2009  Glucose 65 - 99 mg/dL 159(H) 111(H) 133(H)  BUN 7 - 25 mg/dL 8 7 5(L)  Creatinine 0.50 - 0.99 mg/dL 0.70 0.62 0.54  Sodium 135 - 146 mmol/L 141 137 140  Potassium 3.5 - 5.3 mmol/L 4.1 3.4(L) 4.2  Chloride 98 - 110 mmol/L 101 100 105  CO2 20 - 31 mmol/L 25 26 30  Calcium 8.6 - 10.4 mg/dL 9.9 9.5 8.7  Total Protein 6.1 - 8.1 g/dL 6.3 - -  Total Bilirubin 0.2 - 1.2 mg/dL 0.7 - -    Alkaline Phos 33 - 130 U/L 75 - -  AST 10 - 35 U/L 25 - -  ALT 6 - 29 U/L 29 - -     Imaging: No results found.  Speciality Comments: No specialty comments available.    Procedures:  No procedures performed Allergies: Patient has no known allergies.   Assessment / Plan:     Visit Diagnoses: Sarcoidosis, lung, stage I  Abnormal serum angiotensin-converting enzyme level  Age-related osteoporosis without current pathological fracture  High risk medication use  Hilar lymphadenopathy  RLS (restless legs syndrome)  History of hypertension  History of hyperlipidemia  ANA positive  At risk for coronary artery disease    Orders: No orders of the defined types were placed in this encounter.  No orders of the defined types were placed in this encounter.   Face-to-face time spent with patient was *** minutes. 50% of time was spent in counseling and coordination of care.  Follow-Up Instructions: No Follow-up on file.   Amy Littrell, RT  Note - This record has been created using Dragon software.  Chart creation errors have been sought, but may not always  have been located. Such creation errors do not reflect on  the standard of medical care. 

## 2017-04-25 ENCOUNTER — Ambulatory Visit: Payer: Self-pay | Admitting: Rheumatology

## 2017-05-01 ENCOUNTER — Other Ambulatory Visit: Payer: Self-pay | Admitting: Rheumatology

## 2017-05-02 NOTE — Telephone Encounter (Signed)
OK 

## 2017-05-02 NOTE — Telephone Encounter (Signed)
Last Visit: 08/30/16 Next Visit: 05/31/17 Labs: 01/30/17 Elevated glucose  Okay to refill Fosamax?

## 2017-05-05 ENCOUNTER — Other Ambulatory Visit: Payer: Self-pay | Admitting: Rheumatology

## 2017-05-06 NOTE — Telephone Encounter (Signed)
Last Visit: 08/30/16 Next Visit: 05/31/17 Labs: 01/30/17 Elevated glucose  Okay to MTX?

## 2017-05-06 NOTE — Telephone Encounter (Signed)
ok 

## 2017-05-28 NOTE — Progress Notes (Addendum)
Office Visit Note  Patient: Alison [name]             Date of Birth: 10/11/1956           MRN: [id number]             PCP: Aletha Halim., PA-C Referring: Stephens Shire, MD Visit Date: 05/31/2017 Occupation: @GUAROCC @    Subjective:  Medication management.   History of Present Illness: Alison [name] is a 61 y.o. female with history of sarcoidosis she returns for follow-up visit today. She states she's been doing well without any side effects of the medication or a new symptoms. She does not follow up with the pulmonologist anymore. She denies any joint pain or joint swelling. She's been on Fosamax for osteoporosis now and has been tolerating it well.   Activities of Daily Living:  Patient reports morning stiffness for 0 minute.   Patient Denies nocturnal pain.  Difficulty dressing/grooming: Denies Difficulty climbing stairs: Denies Difficulty getting out of chair: Denies Difficulty using hands for taps, buttons, cutlery, and/or writing: Denies   Review of Systems  Constitutional: Negative for fatigue, night sweats, weight gain, weight loss and weakness.  HENT: Positive for mouth dryness. Negative for mouth sores, trouble swallowing, trouble swallowing and nose dryness.   Eyes: Negative for pain, redness, visual disturbance and dryness.  Respiratory: Negative for cough, shortness of breath and difficulty breathing.   Cardiovascular: Negative for chest pain, palpitations, hypertension, irregular heartbeat and swelling in legs/feet.  Gastrointestinal: Negative for blood in stool, constipation and diarrhea.  Endocrine: Negative for increased urination.  Genitourinary: Negative for vaginal dryness.  Musculoskeletal: Negative for arthralgias, joint pain, joint swelling, myalgias, muscle weakness, morning stiffness, muscle tenderness and myalgias.  Skin: Negative for color change, rash, hair loss, skin tightness, ulcers and sensitivity to sunlight.  Allergic/Immunologic:  Negative for susceptible to infections.  Neurological: Negative for dizziness, memory loss and night sweats.  Hematological: Negative for swollen glands.  Psychiatric/Behavioral: Positive for sleep disturbance. Negative for depressed mood. The patient is not nervous/anxious.     PMFS History:  Patient Active Problem List   Diagnosis Date Noted  . Hilar lymphadenopathy 03/16/2017  . High risk medication use 03/16/2017  . Age-related osteoporosis without current pathological fracture 03/16/2017  . ANA positive 03/11/2017  . Abnormal serum angiotensin-converting enzyme level 03/11/2017  . Chronic cough 01/22/2013  . Dysphagia 10/16/2012  . Sarcoidosis, lung, stage I 02/04/2012  . At risk for coronary artery disease 04/25/2011  . Endometriosis   . Hypertension   . Hyperlipidemia   . RLS (restless legs syndrome)     Past Medical History:  Diagnosis Date  . Anemia    hx of due to heavy menstral cycles  . Chest pain   . Endometriosis   . GERD (gastroesophageal reflux disease)   . Headache(784.0)    occassional headaches  . Hyperlipidemia   . Hypertension   . Inflammatory arthritis   . Mediastinal adenopathy March 2013   PET HOT, High ACE level, EBUS 02/26/12  . RLS (restless legs syndrome)   . Vitamin D deficiency     Family History  Problem Relation Age of Onset  . Heart disease Father   . Heart attack Father   . Hypertension Father   . Asthma Mother   . Diabetes Mother        and sister, MGM  . Cancer Maternal Grandfather   . Hypertension Paternal Grandmother    Past Surgical History:  Procedure  Laterality Date  . CESAREAN SECTION    . ENDOMETRIAL BIOPSY    . FINGER SURGERY     x2  . TONSILLECTOMY    . TUMOR REMOVAL     gastric schwannoma   Social History   Social History Narrative  . No narrative on file     Objective: Vital Signs: BP 122/68   Pulse 78   Resp 16   Ht 5\' 7"  (1.702 m)   Wt 166 lb (75.3 kg)   BMI 26.00 kg/m    Physical Exam    Constitutional: She is oriented to person, place, and time. She appears well-developed and well-nourished.  HENT:  Head: Normocephalic and atraumatic.  Eyes: Conjunctivae and EOM are normal.  Neck: Normal range of motion.  Cardiovascular: Normal rate, regular rhythm, normal heart sounds and intact distal pulses.   Pulmonary/Chest: Effort normal and breath sounds normal.  Abdominal: Soft. Bowel sounds are normal.  Lymphadenopathy:    She has no cervical adenopathy.  Neurological: She is alert and oriented to person, place, and time.  Skin: Skin is warm and dry. Capillary refill takes less than 2 seconds.  Psychiatric: She has a normal mood and affect. Her behavior is normal.  Nursing note and vitals reviewed.    Musculoskeletal Exam: C-spine good range of motion she has mild thoracic kyphosis lumbar spine good range of motion. Shoulder joints elbow joints wrist joint MCPs PIPs DIPs with good range of motion with no synovitis. Hip joints knee joints ankles MTPs PIPs DIPs are good range of motion with no synovitis.  CDAI Exam: No CDAI exam completed.    Investigation: No additional findings. CMP Latest Ref Rng & Units 01/30/2017 02/25/2012 04/30/2009  Glucose 65 - 99 mg/dL 159(H) 111(H) 133(H)  BUN 7 - 25 mg/dL 8 7 5(L)  Creatinine 0.50 - 0.99 mg/dL 0.70 0.62 0.54  Sodium 135 - 146 mmol/L 141 137 140  Potassium 3.5 - 5.3 mmol/L 4.1 3.4(L) 4.2  Chloride 98 - 110 mmol/L 101 100 105  CO2 20 - 31 mmol/L 25 26 30   Calcium 8.6 - 10.4 mg/dL 9.9 9.5 8.7  Total Protein 6.1 - 8.1 g/dL 6.3 - -  Total Bilirubin 0.2 - 1.2 mg/dL 0.7 - -  Alkaline Phos 33 - 130 U/L 75 - -  AST 10 - 35 U/L 25 - -  ALT 6 - 29 U/L 29 - -   CBC Latest Ref Rng & Units 01/30/2017 02/25/2012 04/30/2009  WBC 3.8 - 10.8 K/uL 8.8 11.1(H) 10.8(H)  Hemoglobin 11.7 - 15.5 g/dL 13.2 14.0 11.5(L)  Hematocrit 35.0 - 45.0 % 39.8 41.3 34.6(L)  Platelets 140 - 400 K/uL 339 309 288  VitD 56   Imaging: No results  found.  Speciality Comments: No specialty comments available.    Procedures:  No procedures performed Allergies: Patient has no known allergies.   Assessment / Plan:     Visit Diagnoses: Sarcoidosis, lung, stage I - +ACE, hilar lymphadenopathy. She's been doing really well with no shortness of breath. She's not following a pulmonologist anymore.  History of Hilar lymphadenopathy  High risk medication use - Methotrexate 5 tablets po qwk, Folic acid 1 mg po qd - Plan: CBC with Differential/Platelet, COMPLETE METABOLIC PANEL WITH GFR today and every 3 months  ANA positive +Ro : She's having severe sicca symptoms. Over-the-counter products were discussed at length. We also discussed possible use of pilocarpine. Have given her prescription for pilocarpine 5 mg by mouth 3 times a day when  necessary, side effects were discussed at length.  Age-related osteoporosis without current pathological fracture - on Fosamax 70 mg po qweek, T score -2.08 June 2016, BMD 0.754. Her vitamin D is in desirable range. Use of calcium and resistive exercise was discussed.  RLS (restless legs syndrome)  History of hypertension  History of hyperlipidemia    Orders: Orders Placed This Encounter  Procedures  . CBC with Differential/Platelet  . COMPLETE METABOLIC PANEL WITH GFR  . CBC with Differential/Platelet  . COMPLETE METABOLIC PANEL WITH GFR   Meds ordered this encounter  Medications  . pilocarpine (SALAGEN) 5 MG tablet    Sig: 1 tablet by mouth 3 times a day when necessary    Dispense:  90 tablet    Refill:  2    Face-to-face time spent with patient was 30 minutes. 50% of time was spent in counseling and coordination of care.  Follow-Up Instructions: Return in about 6 months (around 11/30/2017) for Sarcoidosis, Sjogren's, Osteoporosis.   Bo Merino, MD  Note - This record has been created using Editor, commissioning.  Chart creation errors have been sought, but may not always  have been  located. Such creation errors do not reflect on  the standard of medical care.

## 2017-05-31 ENCOUNTER — Encounter: Payer: Self-pay | Admitting: Rheumatology

## 2017-05-31 ENCOUNTER — Ambulatory Visit (INDEPENDENT_AMBULATORY_CARE_PROVIDER_SITE_OTHER): Payer: BLUE CROSS/BLUE SHIELD | Admitting: Rheumatology

## 2017-05-31 VITALS — BP 122/68 | HR 78 | Resp 16 | Ht 67.0 in | Wt 166.0 lb

## 2017-05-31 DIAGNOSIS — Z8639 Personal history of other endocrine, nutritional and metabolic disease: Secondary | ICD-10-CM

## 2017-05-31 DIAGNOSIS — M810 Age-related osteoporosis without current pathological fracture: Secondary | ICD-10-CM

## 2017-05-31 DIAGNOSIS — D860 Sarcoidosis of lung: Secondary | ICD-10-CM | POA: Diagnosis not present

## 2017-05-31 DIAGNOSIS — R590 Localized enlarged lymph nodes: Secondary | ICD-10-CM

## 2017-05-31 DIAGNOSIS — R768 Other specified abnormal immunological findings in serum: Secondary | ICD-10-CM

## 2017-05-31 DIAGNOSIS — Z8679 Personal history of other diseases of the circulatory system: Secondary | ICD-10-CM

## 2017-05-31 DIAGNOSIS — Z79899 Other long term (current) drug therapy: Secondary | ICD-10-CM

## 2017-05-31 DIAGNOSIS — G2581 Restless legs syndrome: Secondary | ICD-10-CM | POA: Diagnosis not present

## 2017-05-31 LAB — CBC WITH DIFFERENTIAL/PLATELET
Basophils Absolute: 0 cells/uL (ref 0–200)
Basophils Relative: 0 %
Eosinophils Absolute: 189 cells/uL (ref 15–500)
Eosinophils Relative: 3 %
HCT: 41 % (ref 35.0–45.0)
Hemoglobin: 13.8 g/dL (ref 11.7–15.5)
Lymphocytes Relative: 23 %
Lymphs Abs: 1449 cells/uL (ref 850–3900)
MCH: 30.4 pg (ref 27.0–33.0)
MCHC: 33.7 g/dL (ref 32.0–36.0)
MCV: 90.3 fL (ref 80.0–100.0)
MPV: 10.2 fL (ref 7.5–12.5)
Monocytes Absolute: 630 cells/uL (ref 200–950)
Monocytes Relative: 10 %
Neutro Abs: 4032 cells/uL (ref 1500–7800)
Neutrophils Relative %: 64 %
Platelets: 354 10*3/uL (ref 140–400)
RBC: 4.54 MIL/uL (ref 3.80–5.10)
RDW: 13.7 % (ref 11.0–15.0)
WBC: 6.3 10*3/uL (ref 3.8–10.8)

## 2017-05-31 MED ORDER — PILOCARPINE HCL 5 MG PO TABS
ORAL_TABLET | ORAL | 2 refills | Status: DC
Start: 2017-05-31 — End: 2017-07-19

## 2017-05-31 NOTE — Addendum Note (Signed)
Addended by: Bo Merino on: 05/31/2017 11:15 AM   Modules accepted: Orders

## 2017-05-31 NOTE — Patient Instructions (Signed)
Standing Labs We placed an order today for your standing lab work.    Please come back and get your standing labs in September and every 3 months  We have open lab Monday through Friday from 8:30-11:30 AM and 1:30-4 PM at the office of Dr. Tresa Moore, PA.   The office is located at [address] [place], [place], [place] [address] No appointment is necessary.   Labs are drawn by [facility].  You may receive a bill from [facility] for your lab work. If you have any questions regarding directions or hours of operation,  please call [phone].

## 2017-06-01 LAB — COMPLETE METABOLIC PANEL WITH GFR
ALT: 46 U/L — ABNORMAL HIGH (ref 6–29)
AST: 31 U/L (ref 10–35)
Albumin: 4.2 g/dL (ref 3.6–5.1)
Alkaline Phosphatase: 100 U/L (ref 33–130)
BUN: 7 mg/dL (ref 7–25)
CO2: 24 mmol/L (ref 20–31)
Calcium: 9.5 mg/dL (ref 8.6–10.4)
Chloride: 101 mmol/L (ref 98–110)
Creat: 0.65 mg/dL (ref 0.50–0.99)
GFR, Est African American: >89 mL/min (ref >=60)
GFR, Est Non African American: >89 mL/min (ref >=60)
Glucose, Bld: 132 mg/dL — ABNORMAL HIGH (ref 65–99)
Potassium: 4 mmol/L (ref 3.5–5.3)
Sodium: 139 mmol/L (ref 135–146)
Total Bilirubin: 0.8 mg/dL (ref 0.2–1.2)
Total Protein: 6.6 g/dL (ref 6.1–8.1)

## 2017-07-19 ENCOUNTER — Telehealth: Payer: Self-pay | Admitting: Radiology

## 2017-07-19 MED ORDER — PILOCARPINE HCL 5 MG PO TABS: ORAL_TABLET | ORAL | 2 refills | Status: DC

## 2017-07-19 NOTE — Telephone Encounter (Signed)
Refill request received via fax for Pilocarpine  [facility] [address]

## 2017-07-19 NOTE — Telephone Encounter (Signed)
05/31/17 last visit 01/16/17 next visit   Ok to refill per Dr Estanislado Pandy

## 2017-07-28 ENCOUNTER — Other Ambulatory Visit: Payer: Self-pay | Admitting: Rheumatology

## 2017-07-29 NOTE — Telephone Encounter (Signed)
Elevated ALT prob due to statin use. Will monitor.

## 2017-07-29 NOTE — Telephone Encounter (Signed)
Last Visit: 05/31/17 Next Visit: 01/16/18 Labs: 05/31/17 ALT 46 previously normal   Okay to refill MTX?

## 2017-08-03 ENCOUNTER — Other Ambulatory Visit: Payer: Self-pay | Admitting: Rheumatology

## 2017-08-06 NOTE — Telephone Encounter (Signed)
Last Visit: 05/31/17 Next Visit: 01/16/18 Labs: 05/31/17 ALT 46 previously normal   Okay to refill per Dr. Estanislado Pandy

## 2017-08-31 ENCOUNTER — Other Ambulatory Visit: Payer: Self-pay | Admitting: Rheumatology

## 2017-09-02 NOTE — Telephone Encounter (Signed)
Last Visit: 05/31/17 Next Visit: 01/16/18  Okay to refill per Dr. Estanislado Pandy

## 2017-09-06 ENCOUNTER — Other Ambulatory Visit: Payer: Self-pay | Admitting: Rheumatology

## 2017-09-06 NOTE — Telephone Encounter (Signed)
Last Visit: 05/31/17 Next Visit: 01/16/18  Okay to refill per Dr. Estanislado Pandy

## 2017-10-25 ENCOUNTER — Other Ambulatory Visit: Payer: Self-pay | Admitting: Rheumatology

## 2017-10-28 NOTE — Telephone Encounter (Signed)
Last Visit: 05/31/17 Next Visit:02/20/18 Labs: 05/31/17 Stable  Left message to advise patient she is due to update labs.  Okay to refill 30 day supply per Dr. Estanislado Pandy

## 2017-11-05 ENCOUNTER — Other Ambulatory Visit: Payer: Self-pay | Admitting: Rheumatology

## 2017-11-05 NOTE — Telephone Encounter (Signed)
Last Visit: 05/31/17 Next Visit:02/20/18 Labs: 05/31/17 Stable  Okay to refill 30 day supply per Dr. Estanislado Pandy

## 2017-12-02 ENCOUNTER — Other Ambulatory Visit: Payer: Self-pay | Admitting: Rheumatology

## 2017-12-02 MED ORDER — ALENDRONATE SODIUM 70 MG PO TABS: ORAL_TABLET | ORAL | 0 refills | Status: DC

## 2017-12-02 NOTE — Telephone Encounter (Addendum)
Last Visit: 05/31/17 Next Visit:02/20/18 Labs: 05/31/17 Stable  Left message to advise patient she is due to update labs  Okay to refill 30 day supply of Fosamax and MTX?

## 2017-12-02 NOTE — Telephone Encounter (Signed)
She needs labs before we can refill methotrexate. You may refill Fosamax.

## 2017-12-25 ENCOUNTER — Other Ambulatory Visit: Payer: Self-pay | Admitting: *Deleted

## 2017-12-25 DIAGNOSIS — Z79899 Other long term (current) drug therapy: Secondary | ICD-10-CM

## 2017-12-25 NOTE — Addendum Note (Signed)
Addended by: Carole Binning on: 12/25/2017 09:45 AM   Modules accepted: Orders

## 2017-12-26 LAB — CBC WITH DIFFERENTIAL/PLATELET
Basophils Absolute: 41 cells/uL (ref 0–200)
Basophils Relative: 0.6 %
Eosinophils Absolute: 177 cells/uL (ref 15–500)
Eosinophils Relative: 2.6 %
HCT: 39.9 % (ref 35.0–45.0)
Hemoglobin: 13.6 g/dL (ref 11.7–15.5)
Lymphs Abs: 1822 cells/uL (ref 850–3900)
MCH: 29.7 pg (ref 27.0–33.0)
MCHC: 34.1 g/dL (ref 32.0–36.0)
MCV: 87.1 fL (ref 80.0–100.0)
MPV: 11.1 fL (ref 7.5–12.5)
Monocytes Relative: 9.4 %
Neutro Abs: 4121 cells/uL (ref 1500–7800)
Neutrophils Relative %: 60.6 %
Platelets: 366 10*3/uL (ref 140–400)
RBC: 4.58 10*6/uL (ref 3.80–5.10)
RDW: 12.8 % (ref 11.0–15.0)
Total Lymphocyte: 26.8 %
WBC mixed population: 639 cells/uL (ref 200–950)
WBC: 6.8 10*3/uL (ref 3.8–10.8)

## 2017-12-26 LAB — COMPLETE METABOLIC PANEL WITH GFR
AG Ratio: 1.5 (calc) (ref 1.0–2.5)
ALT: 53 U/L — ABNORMAL HIGH (ref 6–29)
AST: 34 U/L (ref 10–35)
Albumin: 4.1 g/dL (ref 3.6–5.1)
Alkaline phosphatase (APISO): 84 U/L (ref 33–130)
BUN: 10 mg/dL (ref 7–25)
CO2: 30 mmol/L (ref 20–32)
Calcium: 10 mg/dL (ref 8.6–10.4)
Chloride: 102 mmol/L (ref 98–110)
Creat: 0.69 mg/dL (ref 0.50–0.99)
GFR, Est African American: 108 mL/min/{1.73_m2} (ref >=60)
GFR, Est Non African American: 93 mL/min/{1.73_m2} (ref >=60)
Globulin: 2.7 g/dL (calc) (ref 1.9–3.7)
Glucose, Bld: 121 mg/dL — ABNORMAL HIGH (ref 65–99)
Potassium: 4.7 mmol/L (ref 3.5–5.3)
Sodium: 139 mmol/L (ref 135–146)
Total Bilirubin: 0.8 mg/dL (ref 0.2–1.2)
Total Protein: 6.8 g/dL (ref 6.1–8.1)

## 2017-12-26 NOTE — Progress Notes (Signed)
ALT is elevated probably due to combination of methotrexate and statins. Patient is clinically doing well. She may reduce her methotrexate to 4 tablets by mouth every week.

## 2017-12-27 ENCOUNTER — Other Ambulatory Visit: Payer: Self-pay | Admitting: Rheumatology

## 2017-12-28 ENCOUNTER — Other Ambulatory Visit: Payer: Self-pay | Admitting: Rheumatology

## 2017-12-30 NOTE — Telephone Encounter (Signed)
Last Visit: 05/31/17 Next Visit:02/20/18 Labs: 12/25/17 Elevated glucose and elevated ALT  Okay to refill per Dr. Estanislado Pandy

## 2018-01-13 ENCOUNTER — Ambulatory Visit: Payer: BLUE CROSS/BLUE SHIELD | Admitting: Rheumatology

## 2018-01-16 ENCOUNTER — Ambulatory Visit: Payer: BLUE CROSS/BLUE SHIELD | Admitting: Rheumatology

## 2018-01-27 ENCOUNTER — Other Ambulatory Visit: Payer: Self-pay | Admitting: Rheumatology

## 2018-01-27 NOTE — Telephone Encounter (Signed)
Last Visit: 05/31/17 Next Visit:02/20/18 Labs: 1/23/19Elevated glucose and elevated ALT  Okay to refill per Dr. Estanislado Pandy

## 2018-02-06 NOTE — Progress Notes (Signed)
Office Visit Note  Patient: Alison [name]             Date of Birth: 1956/04/28           MRN: [id number]             PCP: Aletha Halim., PA-C Referring: Aletha Halim., PA-C Visit Date: 02/20/2018 Occupation: @GUAROCC @    Subjective:  Bilateral ankle pain   History of Present Illness: Alison [name] is a 62 y.o. female with history of sarcoidosis and osteoporosis.  Patient was originally taking methotrexate 6 tablets weekly instructed to decrease her dose to 4 tablets/week in January 2019 due to elevation of ALT.  She states that she has eliminated drinking wine during the week and has been avoiding Tylenol and NSAIDs.  She states that she was having increased bilateral ankle pain and swelling when she was only taking 4 tablets of methotrexate.  She states that last week she took 5 tablets and her ankle pain resolved.  She would like to increase her methotrexate dose back to 5 tablets/week.  She states she continues to take folic acid 2 mg daily.  She states she also has some discomfort in her right walking.  She denies any warmth redness or swelling.  Denies history of gout.  She states she experiences the discomfort especially with weather changes.  She denies any pain in her hands. Patient denies any recent flares of sarcoidosis.  She denies any coughing or shortness of breath.  She states she no longer sees a pulmonologist. Patient states that she takes pilocarpine 5 mg once a day which helps with her sicca symptoms.  She cannot tolerate taking 2 tablets a day due to increased sweating.  She denies any swollen lymph nodes. She is on Fosamax 70-minute milligrams weekly, vitamin D and calcium supplements for management of her osteoporosis.     Activities of Daily Living:  Patient reports morning stiffness for 0  minutes.   Patient Denies nocturnal pain.  Difficulty dressing/grooming: Denies Difficulty climbing stairs: Denies Difficulty getting out of chair:  Denies Difficulty using hands for taps, buttons, cutlery, and/or writing: Denies   Review of Systems  Constitutional: Negative for fatigue.  HENT: Positive for mouth dryness. Negative for mouth sores and nose dryness.   Eyes: Positive for dryness. Negative for pain, redness and visual disturbance.  Respiratory: Negative for cough, hemoptysis, shortness of breath and difficulty breathing.   Cardiovascular: Negative for chest pain, palpitations, hypertension, irregular heartbeat and swelling in legs/feet.  Gastrointestinal: Negative for blood in stool, constipation and diarrhea.  Endocrine: Negative for increased urination.  Genitourinary: Negative for painful urination.  Musculoskeletal: Positive for arthralgias, joint pain and joint swelling. Negative for myalgias, muscle weakness, morning stiffness, muscle tenderness and myalgias.  Skin: Negative for color change, pallor, rash, hair loss, nodules/bumps, redness, skin tightness, ulcers and sensitivity to sunlight.  Allergic/Immunologic: Negative for susceptible to infections.  Neurological: Negative for dizziness, numbness, headaches and weakness.  Hematological: Negative for swollen glands.  Psychiatric/Behavioral: Negative for depressed mood and sleep disturbance. The patient is not nervous/anxious.     PMFS History:  Patient Active Problem List   Diagnosis Date Noted  . Hilar lymphadenopathy 03/16/2017  . High risk medication use 03/16/2017  . Age-related osteoporosis without current pathological fracture 03/16/2017  . ANA positive 03/11/2017  . Abnormal serum angiotensin-converting enzyme level 03/11/2017  . Chronic cough 01/22/2013  . Dysphagia 10/16/2012  . Sarcoidosis, lung, stage I 02/04/2012  . At risk  for coronary artery disease 04/25/2011  . Endometriosis   . Hypertension   . Hyperlipidemia   . RLS (restless legs syndrome)     Past Medical History:  Diagnosis Date  . Anemia    hx of due to heavy menstral cycles  .  Chest pain   . Endometriosis   . GERD (gastroesophageal reflux disease)   . Headache(784.0)    occassional headaches  . Hyperlipidemia   . Hypertension   . Inflammatory arthritis   . Mediastinal adenopathy March 2013   PET HOT, High ACE level, EBUS 02/26/12  . RLS (restless legs syndrome)   . Vitamin D deficiency     Family History  Problem Relation Age of Onset  . Heart disease Father   . Heart attack Father   . Hypertension Father   . Alzheimer's disease Father   . Asthma Mother   . Diabetes Mother        and sister, MGM  . Cancer Maternal Grandfather   . Hypertension Paternal Grandmother    Past Surgical History:  Procedure Laterality Date  . CESAREAN SECTION    . ENDOMETRIAL BIOPSY    . FINGER SURGERY     x2  . TONSILLECTOMY    . TUMOR REMOVAL     gastric schwannoma   Social History   Social History Narrative  . Not on file     Objective: Vital Signs: BP 120/85 (BP Location: Left Arm, Patient Position: Sitting, Cuff Size: Normal)   Pulse 71   Resp 16   Ht 5' 6.5" (1.689 m)   Wt 168 lb (76.2 kg)   BMI 26.71 kg/m    Physical Exam  Constitutional: She is oriented to person, place, and time. She appears well-developed and well-nourished.  HENT:  Head: Normocephalic and atraumatic.  Eyes: Conjunctivae and EOM are normal.  Neck: Normal range of motion.  Cardiovascular: Normal rate, regular rhythm, normal heart sounds and intact distal pulses.  Pulmonary/Chest: Effort normal and breath sounds normal.  Abdominal: Soft. Bowel sounds are normal.  Lymphadenopathy:    She has no cervical adenopathy.  Neurological: She is alert and oriented to person, place, and time.  Skin: Skin is warm and dry. Capillary refill takes less than 2 seconds.  Psychiatric: She has a normal mood and affect. Her behavior is normal.  Nursing note and vitals reviewed.    Musculoskeletal Exam: C-spine, thoracic spine, lumbar spine good range of motion.  No midline spinal tenderness.   No SI joint tenderness.  Shoulder joints, elbow joints, wrist joints, MCPs, PIPs, DIPs good range of motion with no synovitis.  She has mild DIP synovial thickening consistent with osteoarthritis.  Hip joints, knee joints, ankle joints, MTPs, PIPs, DIPs good range of motion with no synovitis.  She has no tenderness along the ankle joint line bilaterally.  She has no tenderness of the first MTP bilaterally.  No warmth fusion noted in ankles bilaterally. No warmth or effusion of bilateral knees.  She has bilateral knee crepitus. No tenderness of trochanteric bursa bilaterally.  CDAI Exam: No CDAI exam completed.    Investigation: No additional findings. CBC Latest Ref Rng & Units 12/25/2017 05/31/2017 01/30/2017  WBC 3.8 - 10.8 Thousand/uL 6.8 6.3 8.8  Hemoglobin 11.7 - 15.5 g/dL 13.6 13.8 13.2  Hematocrit 35.0 - 45.0 % 39.9 41.0 39.8  Platelets 140 - 400 Thousand/uL 366 354 339   CMP Latest Ref Rng & Units 12/25/2017 05/31/2017 01/30/2017  Glucose 65 - 99 mg/dL 121(H) 132(H)  159(H)  BUN 7 - 25 mg/dL 10 7 8   Creatinine 0.50 - 0.99 mg/dL 0.69 0.65 0.70  Sodium 135 - 146 mmol/L 139 139 141  Potassium 3.5 - 5.3 mmol/L 4.7 4.0 4.1  Chloride 98 - 110 mmol/L 102 101 101  CO2 20 - 32 mmol/L 30 24 25   Calcium 8.6 - 10.4 mg/dL 10.0 9.5 9.9  Total Protein 6.1 - 8.1 g/dL 6.8 6.6 6.3  Total Bilirubin 0.2 - 1.2 mg/dL 0.8 0.8 0.7  Alkaline Phos 33 - 130 U/L - 100 75  AST 10 - 35 U/L 34 31 25  ALT 6 - 29 U/L 53(H) 46(H) 29    Imaging: No results found.  Speciality Comments: No specialty comments available.    Procedures:  No procedures performed Allergies: Patient has no known allergies.   Assessment / Plan:     Visit Diagnoses: Sarcoidosis, lung, stage I - +ACE, hilar lymphadenopathy.:  No recent flares.  No pulmonaryymptoms or complaints today.  Lungs were clear on exam.  She has no longer followed by a pulmonologist.  High risk medication use - MTX, folic acid: CBC and CMP were ordered today.   ALT was 53 on 12/25/2017.  She decreased her methotrexate dose to 4 tablets weekly.  CBC and CMP are ordered today to monitor for drug toxicity.  She will continue to return for labs every 3 months.   - Plan: CBC with Differential/Platelet, COMPLETE METABOLIC PANEL WITH GFR  Hilar lymphadenopathy  ANA positive - +Ro: She continues to have sicca symptoms.  Her symptoms have improved since being on Pilocarpine 5 mg once daily.  She could not tolerate taking it BID due to increased sweating.  A refill of Pilocarpine was sent to the pharmacy today.    Age-related osteoporosis without current pathological fracture - on Fosamax 70 mg po qweek, T score -2.08 June 2016, BMD 0.754.  She continues to take vitamin D and calcium supplements daily.   Other medical conditions are listed as follows:  History of hyperlipidemia  History of hypertension  RLS (restless legs syndrome)    Orders: Orders Placed This Encounter  Procedures  . CBC with Differential/Platelet  . COMPLETE METABOLIC PANEL WITH GFR   No orders of the defined types were placed in this encounter.     Follow-Up Instructions: Return in about 5 months (around 07/23/2018) for Sarcoidosis, Osteoporosis.   Ofilia Neas, PA-C   I examined and evaluated the patient with Hazel Sams PA.  Patient has no synovitis on my examination.  Has been tolerating.  We will monitor her labs today.  The plan of care was discussed as noted above.  Bo Merino, MD  Note - This record has been created using Editor, commissioning.  Chart creation errors have been sought, but may not always  have been located. Such creation errors do not reflect on  the standard of medical care.

## 2018-02-20 ENCOUNTER — Ambulatory Visit: Payer: BLUE CROSS/BLUE SHIELD | Admitting: Rheumatology

## 2018-02-20 ENCOUNTER — Encounter: Payer: Self-pay | Admitting: Rheumatology

## 2018-02-20 VITALS — BP 120/85 | HR 71 | Resp 16 | Ht 66.5 in | Wt 168.0 lb

## 2018-02-20 DIAGNOSIS — D860 Sarcoidosis of lung: Secondary | ICD-10-CM

## 2018-02-20 DIAGNOSIS — M810 Age-related osteoporosis without current pathological fracture: Secondary | ICD-10-CM

## 2018-02-20 DIAGNOSIS — R590 Localized enlarged lymph nodes: Secondary | ICD-10-CM | POA: Diagnosis not present

## 2018-02-20 DIAGNOSIS — R768 Other specified abnormal immunological findings in serum: Secondary | ICD-10-CM | POA: Diagnosis not present

## 2018-02-20 DIAGNOSIS — Z8639 Personal history of other endocrine, nutritional and metabolic disease: Secondary | ICD-10-CM

## 2018-02-20 DIAGNOSIS — Z8679 Personal history of other diseases of the circulatory system: Secondary | ICD-10-CM

## 2018-02-20 DIAGNOSIS — Z79899 Other long term (current) drug therapy: Secondary | ICD-10-CM

## 2018-02-20 DIAGNOSIS — R7689 Other specified abnormal immunological findings in serum: Secondary | ICD-10-CM

## 2018-02-20 DIAGNOSIS — G2581 Restless legs syndrome: Secondary | ICD-10-CM

## 2018-02-20 LAB — COMPLETE METABOLIC PANEL WITH GFR
AG Ratio: 1.7 (calc) (ref 1.0–2.5)
ALT: 76 U/L — ABNORMAL HIGH (ref 6–29)
AST: 53 U/L — ABNORMAL HIGH (ref 10–35)
Albumin: 4.3 g/dL (ref 3.6–5.1)
Alkaline phosphatase (APISO): 92 U/L (ref 33–130)
BUN: 10 mg/dL (ref 7–25)
CO2: 30 mmol/L (ref 20–32)
Calcium: 9.6 mg/dL (ref 8.6–10.4)
Chloride: 101 mmol/L (ref 98–110)
Creat: 0.71 mg/dL (ref 0.50–0.99)
GFR, Est African American: 106 mL/min/{1.73_m2} (ref >=60)
GFR, Est Non African American: 91 mL/min/{1.73_m2} (ref >=60)
Globulin: 2.5 g/dL (calc) (ref 1.9–3.7)
Glucose, Bld: 113 mg/dL — ABNORMAL HIGH (ref 65–99)
Potassium: 4.7 mmol/L (ref 3.5–5.3)
Sodium: 137 mmol/L (ref 135–146)
Total Bilirubin: 0.9 mg/dL (ref 0.2–1.2)
Total Protein: 6.8 g/dL (ref 6.1–8.1)

## 2018-02-20 LAB — CBC WITH DIFFERENTIAL/PLATELET
Basophils Absolute: 48 cells/uL (ref 0–200)
Basophils Relative: 0.8 %
Eosinophils Absolute: 174 cells/uL (ref 15–500)
Eosinophils Relative: 2.9 %
HCT: 40.2 % (ref 35.0–45.0)
Hemoglobin: 13.9 g/dL (ref 11.7–15.5)
Lymphs Abs: 1632 cells/uL (ref 850–3900)
MCH: 29.7 pg (ref 27.0–33.0)
MCHC: 34.6 g/dL (ref 32.0–36.0)
MCV: 85.9 fL (ref 80.0–100.0)
MPV: 11.1 fL (ref 7.5–12.5)
Monocytes Relative: 8.6 %
Neutro Abs: 3630 cells/uL (ref 1500–7800)
Neutrophils Relative %: 60.5 %
Platelets: 320 10*3/uL (ref 140–400)
RBC: 4.68 10*6/uL (ref 3.80–5.10)
RDW: 12.7 % (ref 11.0–15.0)
Total Lymphocyte: 27.2 %
WBC mixed population: 516 cells/uL (ref 200–950)
WBC: 6 10*3/uL (ref 3.8–10.8)

## 2018-02-20 MED ORDER — PILOCARPINE HCL 5 MG PO TABS: ORAL_TABLET | ORAL | 2 refills | Status: DC

## 2018-02-20 MED ORDER — FOLIC ACID 1 MG PO TABS: 2.0000 mg | ORAL_TABLET | Freq: Every day | ORAL | 2 refills | Status: DC

## 2018-02-21 NOTE — Progress Notes (Signed)
She is on methotrexate and statins.  She had no synovitis during her last visit.  Please have her reduce methotrexate to 3 tablets p.o. weekly due to elevation in her LFTs.

## 2018-02-26 NOTE — Progress Notes (Signed)
Okay to decrease methotrexate to 4 tablets p.o. weekly and repeat labs in 1 month.

## 2018-02-28 ENCOUNTER — Telehealth: Payer: Self-pay | Admitting: Rheumatology

## 2018-02-28 NOTE — Telephone Encounter (Signed)
Patient called stating she was returning your call.   °

## 2018-03-03 NOTE — Telephone Encounter (Signed)
Patient advised she may cut done to the 4 tabs of MTX and then return for labs in 1 month. Patient verbalized understanding.

## 2018-03-17 ENCOUNTER — Telehealth: Payer: Self-pay | Admitting: Rheumatology

## 2018-03-17 MED ORDER — METHOTREXATE 2.5 MG PO TABS: 10.0000 mg | ORAL_TABLET | ORAL | 0 refills | Status: DC

## 2018-03-17 NOTE — Telephone Encounter (Signed)
Patient called stating she is scheduled to have bloodwork after being on Methotrexate for 4 weeks.  Patient states she is short 2 pills and needs a refill.  Patient's pharmacy is [facility] on [address] in [place].

## 2018-03-17 NOTE — Telephone Encounter (Signed)
Last Visit: 02/20/18 Next Visit: 07/24/18 Labs: 02/20/18 elevation in her LFTs.   Okay to refill per Dr. Estanislado Pandy

## 2018-03-22 ENCOUNTER — Other Ambulatory Visit: Payer: Self-pay | Admitting: Physician Assistant

## 2018-03-24 ENCOUNTER — Other Ambulatory Visit: Payer: Self-pay

## 2018-03-24 DIAGNOSIS — Z79899 Other long term (current) drug therapy: Secondary | ICD-10-CM

## 2018-03-24 LAB — CBC WITH DIFFERENTIAL/PLATELET
Basophils Absolute: 29 cells/uL (ref 0–200)
Basophils Relative: 0.4 %
Eosinophils Absolute: 252 cells/uL (ref 15–500)
Eosinophils Relative: 3.5 %
HCT: 40.6 % (ref 35.0–45.0)
Hemoglobin: 14.1 g/dL (ref 11.7–15.5)
Lymphs Abs: 1922 cells/uL (ref 850–3900)
MCH: 29.7 pg (ref 27.0–33.0)
MCHC: 34.7 g/dL (ref 32.0–36.0)
MCV: 85.7 fL (ref 80.0–100.0)
MPV: 11.1 fL (ref 7.5–12.5)
Monocytes Relative: 8.6 %
Neutro Abs: 4378 cells/uL (ref 1500–7800)
Neutrophils Relative %: 60.8 %
Platelets: 343 10*3/uL (ref 140–400)
RBC: 4.74 10*6/uL (ref 3.80–5.10)
RDW: 13 % (ref 11.0–15.0)
Total Lymphocyte: 26.7 %
WBC mixed population: 619 cells/uL (ref 200–950)
WBC: 7.2 10*3/uL (ref 3.8–10.8)

## 2018-03-24 LAB — COMPLETE METABOLIC PANEL WITH GFR
AG Ratio: 1.8 (calc) (ref 1.0–2.5)
ALT: 56 U/L — ABNORMAL HIGH (ref 6–29)
AST: 39 U/L — ABNORMAL HIGH (ref 10–35)
Albumin: 4.2 g/dL (ref 3.6–5.1)
Alkaline phosphatase (APISO): 98 U/L (ref 33–130)
BUN: 8 mg/dL (ref 7–25)
CO2: 29 mmol/L (ref 20–32)
Calcium: 9.8 mg/dL (ref 8.6–10.4)
Chloride: 101 mmol/L (ref 98–110)
Creat: 0.73 mg/dL (ref 0.50–0.99)
GFR, Est African American: 102 mL/min/{1.73_m2} (ref >=60)
GFR, Est Non African American: 88 mL/min/{1.73_m2} (ref >=60)
Globulin: 2.4 g/dL (calc) (ref 1.9–3.7)
Glucose, Bld: 128 mg/dL — ABNORMAL HIGH (ref 65–99)
Potassium: 4.1 mmol/L (ref 3.5–5.3)
Sodium: 139 mmol/L (ref 135–146)
Total Bilirubin: 0.9 mg/dL (ref 0.2–1.2)
Total Protein: 6.6 g/dL (ref 6.1–8.1)

## 2018-03-24 NOTE — Telephone Encounter (Signed)
Last Visit: 02/20/18 Next Visit: 07/24/18  Okay to refill per Dr. Deveshwar  

## 2018-03-25 NOTE — Progress Notes (Signed)
Glucose is elevated probably not fasting.  LFTs are still elevated but better.

## 2018-03-30 ENCOUNTER — Other Ambulatory Visit: Payer: Self-pay | Admitting: Rheumatology

## 2018-03-31 NOTE — Telephone Encounter (Signed)
Last Visit: 02/20/18 Next Visit: 07/24/18 Labs: 03/24/18 Glucose is elevated probably not fasting. LFTs are still elevated but better  Okay to refill per Dr. Estanislado Pandy

## 2018-04-11 ENCOUNTER — Other Ambulatory Visit: Payer: Self-pay | Admitting: Rheumatology

## 2018-04-27 ENCOUNTER — Other Ambulatory Visit: Payer: Self-pay | Admitting: Rheumatology

## 2018-04-29 NOTE — Telephone Encounter (Signed)
Last visit: 02/20/2018 Next visit: 07/24/2018 Labs: 03/24/2018 Glucose is elevated probably not fasting. LFTs are still elevated but better.  Okay to refill per Dr. Estanislado Pandy.

## 2018-05-12 ENCOUNTER — Other Ambulatory Visit: Payer: Self-pay | Admitting: Physician Assistant

## 2018-05-12 NOTE — Telephone Encounter (Signed)
Last visit: 02/20/2018 Next visit: 07/24/2018  Okay to refill per Dr. Estanislado Pandy

## 2018-05-26 ENCOUNTER — Other Ambulatory Visit: Payer: Self-pay | Admitting: Rheumatology

## 2018-05-28 ENCOUNTER — Other Ambulatory Visit: Payer: Self-pay | Admitting: Rheumatology

## 2018-06-13 ENCOUNTER — Telehealth: Payer: Self-pay | Admitting: *Deleted

## 2018-06-13 NOTE — Telephone Encounter (Signed)
Bone Density Scan shows Osteopenia T-Score -1.9  Discontinue Fosamax Calcium,Vitamin D  Resistive Exercises  Repeat Dexa in 2 years.

## 2018-06-27 ENCOUNTER — Other Ambulatory Visit: Payer: Self-pay | Admitting: Rheumatology

## 2018-06-30 NOTE — Telephone Encounter (Addendum)
Last visit: 02/20/2018 Next visit: 07/24/2018 Labs: 03/24/18 Glucose is elevated probably not fasting. LFTs are still elevated but better  Patient advised she is due for labs and will update this week.  Okay to refill 30 day supply per Dr. Estanislado Pandy

## 2018-07-04 ENCOUNTER — Other Ambulatory Visit: Payer: Self-pay

## 2018-07-04 DIAGNOSIS — Z79899 Other long term (current) drug therapy: Secondary | ICD-10-CM

## 2018-07-04 LAB — COMPLETE METABOLIC PANEL WITH GFR
AG Ratio: 1.8 (calc) (ref 1.0–2.5)
ALT: 66 U/L — ABNORMAL HIGH (ref 6–29)
AST: 49 U/L — ABNORMAL HIGH (ref 10–35)
Albumin: 4 g/dL (ref 3.6–5.1)
Alkaline phosphatase (APISO): 92 U/L (ref 33–130)
BUN: 9 mg/dL (ref 7–25)
CO2: 27 mmol/L (ref 20–32)
Calcium: 9.4 mg/dL (ref 8.6–10.4)
Chloride: 102 mmol/L (ref 98–110)
Creat: 0.63 mg/dL (ref 0.50–0.99)
GFR, Est African American: 111 mL/min/{1.73_m2} (ref >=60)
GFR, Est Non African American: 96 mL/min/{1.73_m2} (ref >=60)
Globulin: 2.2 g/dL (calc) (ref 1.9–3.7)
Glucose, Bld: 108 mg/dL — ABNORMAL HIGH (ref 65–99)
Potassium: 3.9 mmol/L (ref 3.5–5.3)
Sodium: 138 mmol/L (ref 135–146)
Total Bilirubin: 0.8 mg/dL (ref 0.2–1.2)
Total Protein: 6.2 g/dL (ref 6.1–8.1)

## 2018-07-04 LAB — CBC WITH DIFFERENTIAL/PLATELET
Basophils Absolute: 31 cells/uL (ref 0–200)
Basophils Relative: 0.5 %
Eosinophils Absolute: 273 cells/uL (ref 15–500)
Eosinophils Relative: 4.4 %
HCT: 37.2 % (ref 35.0–45.0)
Hemoglobin: 12.8 g/dL (ref 11.7–15.5)
Lymphs Abs: 1414 cells/uL (ref 850–3900)
MCH: 29.6 pg (ref 27.0–33.0)
MCHC: 34.4 g/dL (ref 32.0–36.0)
MCV: 86.1 fL (ref 80.0–100.0)
MPV: 11.1 fL (ref 7.5–12.5)
Monocytes Relative: 10 %
Neutro Abs: 3863 cells/uL (ref 1500–7800)
Neutrophils Relative %: 62.3 %
Platelets: 309 10*3/uL (ref 140–400)
RBC: 4.32 10*6/uL (ref 3.80–5.10)
RDW: 12.9 % (ref 11.0–15.0)
Total Lymphocyte: 22.8 %
WBC mixed population: 620 cells/uL (ref 200–950)
WBC: 6.2 10*3/uL (ref 3.8–10.8)

## 2018-07-07 NOTE — Progress Notes (Signed)
LFTs are elevated but is stable.  Most likely due to combination of methotrexate and statins.  Please forward a copy of the results to her PCP.

## 2018-07-24 ENCOUNTER — Ambulatory Visit: Payer: BLUE CROSS/BLUE SHIELD | Admitting: Rheumatology

## 2018-07-29 ENCOUNTER — Other Ambulatory Visit: Payer: Self-pay | Admitting: Rheumatology

## 2018-07-30 NOTE — Telephone Encounter (Signed)
Last visit: 02/20/2018 Next visit: 08/29/18 Labs: 07/04/18 LFTs are elevated but is stable.   Okay to refill per Dr. Estanislado Pandy

## 2018-08-03 ENCOUNTER — Other Ambulatory Visit: Payer: Self-pay | Admitting: Rheumatology

## 2018-08-05 NOTE — Telephone Encounter (Signed)
Last visit: 02/20/2018 Next visit: 08/29/18 Labs: 07/04/18 LFTs are elevated but is stable.   Okay to refill per Dr. Estanislado Pandy

## 2018-08-15 NOTE — Progress Notes (Signed)
Office Visit Note  Patient: Alison [name]             Date of Birth: [birth year]           MRN: [id number]             PCP: Aletha Halim., PA-C Referring: Aletha Halim., PA-C Visit Date: 08/29/2018 Occupation: @GUAROCC @  Subjective:  Pain in both feet   History of Present Illness: Alison [name] is a 62 y.o. female with history of sarcoidosis.  She is taking MTX 4 tablets by mouth once a week and folic acid 2 mg daily.  She reports that since lowering her dose of methotrexate from 5 tablets once weekly to 4 tablets once weekly she developed increased joint pain and swelling in bilateral feet and bilateral ankles.  She states that she is having tenderness and swelling in her right first MTP joint.  She denies any history of gout.  She continues take pilocarpine 3 times daily as needed for mouth dryness.  She has been taking Fosamax 70 mg by mouth once weekly.  She states that she did not hear her results from her last bone density in July 2019.  She denies any new or worsening pulmonary symptoms.  Denies any shortness of breath or coughing.  Denies any nasal drainage.  Denies any swollen lymph nodes.    Activities of Daily Living:  Patient reports morning stiffness for 0 minutes.   Patient Denies nocturnal pain.  Difficulty dressing/grooming: Denies Difficulty climbing stairs: Denies Difficulty getting out of chair: Denies Difficulty using hands for taps, buttons, cutlery, and/or writing: Denies  Review of Systems  Constitutional: Negative for fatigue.  HENT: Positive for mouth dryness. Negative for mouth sores, trouble swallowing, trouble swallowing and nose dryness.   Eyes: Positive for dryness. Negative for pain and visual disturbance.  Respiratory: Negative for cough, hemoptysis, shortness of breath and difficulty breathing.   Cardiovascular: Negative for chest pain, palpitations, hypertension and swelling in legs/feet.  Gastrointestinal: Negative for abdominal pain,  blood in stool, constipation, diarrhea, nausea and vomiting.  Endocrine: Negative for increased urination.  Genitourinary: Negative for painful urination and nocturia.  Musculoskeletal: Positive for arthralgias, joint pain and joint swelling. Negative for myalgias, muscle weakness, morning stiffness, muscle tenderness and myalgias.  Skin: Negative for color change, pallor, rash, hair loss, nodules/bumps, skin tightness, ulcers and sensitivity to sunlight.  Allergic/Immunologic: Negative for susceptible to infections.  Neurological: Negative for dizziness, light-headedness, numbness, headaches, memory loss and weakness.  Hematological: Negative for swollen glands.  Psychiatric/Behavioral: Negative for depressed mood, confusion and sleep disturbance. The patient is not nervous/anxious.     PMFS History:  Patient Active Problem List   Diagnosis Date Noted  . Hilar lymphadenopathy 03/16/2017  . High risk medication use 03/16/2017  . Age-related osteoporosis without current pathological fracture 03/16/2017  . ANA positive 03/11/2017  . Abnormal serum angiotensin-converting enzyme level 03/11/2017  . Chronic cough 01/22/2013  . Dysphagia 10/16/2012  . Sarcoidosis, lung, stage I 02/04/2012  . At risk for coronary artery disease 04/25/2011  . Endometriosis   . Hypertension   . Hyperlipidemia   . RLS (restless legs syndrome)     Past Medical History:  Diagnosis Date  . Anemia    hx of due to heavy menstral cycles  . Chest pain   . Endometriosis   . GERD (gastroesophageal reflux disease)   . Headache(784.0)    occassional headaches  . Hyperlipidemia   . Hypertension   .  Inflammatory arthritis   . Mediastinal adenopathy March 2013   PET HOT, High ACE level, EBUS 02/26/12  . RLS (restless legs syndrome)   . Vitamin D deficiency     Family History  Problem Relation Age of Onset  . Heart disease Father   . Heart attack Father   . Hypertension Father   . Alzheimer's disease Father     . Asthma Mother   . Diabetes Mother        and sister, MGM  . Cancer Maternal Grandfather   . Hypertension Paternal Grandmother    Past Surgical History:  Procedure Laterality Date  . CESAREAN SECTION    . ENDOMETRIAL BIOPSY    . FINGER SURGERY     x2  . TONSILLECTOMY    . TUMOR REMOVAL     gastric schwannoma   Social History   Social History Narrative  . Not on file    Objective: Vital Signs: BP 128/78 (BP Location: Left Arm, Patient Position: Sitting, Cuff Size: Normal)   Pulse 80   Resp 13   Ht 5' 6.5" (1.689 m)   Wt 166 lb (75.3 kg)   BMI 26.39 kg/m    Physical Exam  Constitutional: She is oriented to person, place, and time. She appears well-developed and well-nourished.  HENT:  Head: Normocephalic and atraumatic.  Eyes: Conjunctivae and EOM are normal.  Neck: Normal range of motion.  Cardiovascular: Normal rate, regular rhythm, normal heart sounds and intact distal pulses.  Pulmonary/Chest: Effort normal and breath sounds normal.  Abdominal: Soft. Bowel sounds are normal.  Lymphadenopathy:    She has no cervical adenopathy.  Neurological: She is alert and oriented to person, place, and time.  Skin: Skin is warm and dry. Capillary refill takes less than 2 seconds.  Psychiatric: She has a normal mood and affect. Her behavior is normal.  Nursing note and vitals reviewed.    Musculoskeletal Exam: C-spine, thoracic spine, lumbar spine good range of motion.  No midline spinal tenderness.  No SI joint tenderness.  Shoulder joints, elbow joints, wrist joints, MCPs, PIPs, DIPs good range of motion no synovitis.  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  She has mild CMC joint synovial thickening.  She is complete fist formation bilaterally.  Joints, knee joints, ankle joints, MTPs, PIPs, DIPs good range of motion no synovitis.  No warmth or effusion of bilateral knee joints.  No tenderness or swelling of ankle  joints noted on exam.  She has tenderness of her right first MTP joint.  No Achilles tendinitis or plantar fasciitis.  CDAI Exam: CDAI Score: Not documented Patient Global Assessment: Not documented; Provider Global Assessment: Not documented Swollen: Not documented; Tender: Not documented Joint Exam   Not documented   There is currently no information documented on the homunculus. Go to the Rheumatology activity and complete the homunculus joint exam.  Investigation: No additional findings.  Imaging: No results found.  Recent Labs: Lab Results  Component Value Date   WBC 6.2 07/04/2018   HGB 12.8 07/04/2018   PLT 309 07/04/2018   NA 138 07/04/2018   K 3.9 07/04/2018   CL 102 07/04/2018   CO2 27 07/04/2018   GLUCOSE 108 (H) 07/04/2018   BUN 9 07/04/2018   CREATININE 0.63 07/04/2018   BILITOT 0.8 07/04/2018   ALKPHOS 100 05/31/2017   AST 49 (H) 07/04/2018   ALT 66 (H) 07/04/2018   PROT 6.2 07/04/2018   ALBUMIN 4.2  05/31/2017   CALCIUM 9.4 07/04/2018   GFRAA 111 07/04/2018    Speciality Comments: No specialty comments available.  Procedures:  No procedures performed Allergies: Patient has no known allergies.   Assessment / Plan:     Visit Diagnoses: Sarcoidosis, lung, stage I - +ACE, hilar lymphadenopathy: She has not had any recent flares of sarcoidosis.  She has no new or worsening pulmonary symptoms.  Her lungs are clear to auscultation on exam.  She is no longer followed by a pulmonologist.  She was encouraged to notify us if she develops any new or worsening symptoms.  She will continue on methotrexate 4 tablets by mouth once weekly and folic acid 1 mg daily.  A refill of methotrexate was sent to the pharmacy today.  High risk medication use - MTX 4 tablets by mouth once a week, folic acid 1 mg daily.  She has been taking folic acid 2 mg by mouth daily.  She was reminded that she can decrease to 1 mg by mouth daily since she is on low dose MTX. CBC within normal  limits on 07/04/2018.  LFTs are elevated but stable.  She will return for lab work in November and every 3 months to monitor for drug toxicity.  We will not further increase her methotrexate dose at this time.  Hilar lymphadenopathy  ANA positive - +Ro: She continues to have sicca symptoms.  She takes pilocarpine 5 mg by mouth once daily.  She has tried taking it twice daily but developed increased sweating.  A refill of pilocarpine was sent to the pharmacy today.  Osteopenia of multiple sites -She was advised to discontinue Fosamax due to improvement in her bone density. BMD 0.636, T score -1.10 June 2018.  She will repeat DEXA in 2 years.  She was encouraged to take vitamin D and calcium on a daily basis.  We also discussed the importance of resistive exercises.  Pain in both feet -she is been having increased pain in bilateral feet and bilateral ankles.  She gives a history of intermittent ankle joint swelling.  No warmth or effusion was noted on exam today.  She has tenderness of her right first MTP joint.  X-rays of both feet were obtained today.  We will schedule an ultrasound of bilateral feet to assess for synovitis.  She will continue on methotrexate 4 tablets by mouth once weekly and folic acid 1 mg daily at this time.  Plan: XR Foot 2 Views Right, XR Foot 2 Views Left   Other medical conditions are listed as follows:  History of hypertension  RLS (restless legs syndrome)  History of hyperlipidemia    Orders: Orders Placed This Encounter  Procedures  . XR Foot 2 Views Right  . XR Foot 2 Views Left   Meds ordered this encounter  Medications  . pilocarpine (SALAGEN) 5 MG tablet    Sig: Take 1 tablet by mouth three times daily as needed    Dispense:  90 tablet    Refill:  1  . methotrexate (RHEUMATREX) 2.5 MG tablet    Sig: Take 4 tablets (10 mg total) by mouth once a week.    Dispense:  48 tablet    Refill:  0    Face-to-face time spent with patient was 30 minutes. Greater  than 50% of time was spent in counseling and coordination of care.  Follow-Up Instructions: Return in about 5 months (around 01/29/2019) for Sarcoidosis.   Ofilia Neas, PA-C   I examined  and evaluated the patient with Hazel Sams PA.  Patient had no synovitis on my examination today.  She has thickening of her right first MTP joint.  And some tenderness on palpation.  X-ray of bilateral feet showed right first MTP spurring which I believe is causing the discomfort.  We will schedule ultrasound to look for any underlying synovitis.  At this point I would continue her on methotrexate 4 tablets p.o. weekly due to elevation of LFTs.  The plan of care was discussed as noted above.  Bo Merino, MD Note - This record has been created using Editor, commissioning.  Chart creation errors have been sought, but may not always  have been located. Such creation errors do not reflect on  the standard of medical care.

## 2018-08-29 ENCOUNTER — Ambulatory Visit (INDEPENDENT_AMBULATORY_CARE_PROVIDER_SITE_OTHER): Payer: Self-pay

## 2018-08-29 ENCOUNTER — Encounter: Payer: Self-pay | Admitting: Rheumatology

## 2018-08-29 ENCOUNTER — Ambulatory Visit: Payer: BLUE CROSS/BLUE SHIELD | Admitting: Rheumatology

## 2018-08-29 VITALS — BP 128/78 | HR 80 | Resp 13 | Ht 66.5 in | Wt 166.0 lb

## 2018-08-29 DIAGNOSIS — M79671 Pain in right foot: Secondary | ICD-10-CM

## 2018-08-29 DIAGNOSIS — M79672 Pain in left foot: Secondary | ICD-10-CM | POA: Diagnosis not present

## 2018-08-29 DIAGNOSIS — D860 Sarcoidosis of lung: Secondary | ICD-10-CM

## 2018-08-29 DIAGNOSIS — Z8639 Personal history of other endocrine, nutritional and metabolic disease: Secondary | ICD-10-CM

## 2018-08-29 DIAGNOSIS — Z79899 Other long term (current) drug therapy: Secondary | ICD-10-CM

## 2018-08-29 DIAGNOSIS — R768 Other specified abnormal immunological findings in serum: Secondary | ICD-10-CM | POA: Diagnosis not present

## 2018-08-29 DIAGNOSIS — M8589 Other specified disorders of bone density and structure, multiple sites: Secondary | ICD-10-CM

## 2018-08-29 DIAGNOSIS — R590 Localized enlarged lymph nodes: Secondary | ICD-10-CM

## 2018-08-29 DIAGNOSIS — G2581 Restless legs syndrome: Secondary | ICD-10-CM

## 2018-08-29 DIAGNOSIS — Z8679 Personal history of other diseases of the circulatory system: Secondary | ICD-10-CM

## 2018-08-29 MED ORDER — METHOTREXATE 2.5 MG PO TABS: 10.0000 mg | ORAL_TABLET | ORAL | 0 refills | Status: DC

## 2018-08-29 MED ORDER — PILOCARPINE HCL 5 MG PO TABS: ORAL_TABLET | ORAL | 1 refills | Status: DC

## 2018-08-29 NOTE — Patient Instructions (Signed)
Standing Labs We placed an order today for your standing lab work.    Please come back and get your standing labs in November and every 3 months  We have open lab Monday through Friday from 8:30-11:30 AM and 1:30-4:00 PM  at the office of Dr. Shaili Deveshwar.   You may experience shorter wait times on Monday and Friday afternoons. The office is located at [address], [address], [place], [place] [address] No appointment is necessary.   Labs are drawn by [facility].  You may receive a bill from [facility] for your lab work. If you have any questions regarding directions or hours of operation,  please call [phone].   Just as a reminder please drink plenty of water prior to coming for your lab work. Thanks!  

## 2018-10-21 ENCOUNTER — Other Ambulatory Visit: Payer: Self-pay | Admitting: Rheumatology

## 2018-10-21 NOTE — Telephone Encounter (Addendum)
Last Visit: 08/29/18 Next visit: 12/10/18 Labs: 07/04/18 LFTs are elevated but is stable.   Patient advised she is due for labs. Patient will update labs 12/24/17.   Okay to refill 30 day supply per Dr. Estanislado Pandy

## 2018-10-24 ENCOUNTER — Other Ambulatory Visit: Payer: Self-pay

## 2018-10-24 DIAGNOSIS — Z79899 Other long term (current) drug therapy: Secondary | ICD-10-CM

## 2018-10-24 LAB — COMPLETE METABOLIC PANEL WITH GFR
AG Ratio: 1.8 (calc) (ref 1.0–2.5)
ALT: 79 U/L — ABNORMAL HIGH (ref 6–29)
AST: 57 U/L — ABNORMAL HIGH (ref 10–35)
Albumin: 4.2 g/dL (ref 3.6–5.1)
Alkaline phosphatase (APISO): 106 U/L (ref 33–130)
BUN: 7 mg/dL (ref 7–25)
CO2: 30 mmol/L (ref 20–32)
Calcium: 9.4 mg/dL (ref 8.6–10.4)
Chloride: 100 mmol/L (ref 98–110)
Creat: 0.75 mg/dL (ref 0.50–0.99)
GFR, Est African American: 98 mL/min/{1.73_m2} (ref >=60)
GFR, Est Non African American: 85 mL/min/{1.73_m2} (ref >=60)
Globulin: 2.4 g/dL (calc) (ref 1.9–3.7)
Glucose, Bld: 129 mg/dL — ABNORMAL HIGH (ref 65–99)
Potassium: 3.9 mmol/L (ref 3.5–5.3)
Sodium: 137 mmol/L (ref 135–146)
Total Bilirubin: 0.6 mg/dL (ref 0.2–1.2)
Total Protein: 6.6 g/dL (ref 6.1–8.1)

## 2018-10-24 LAB — CBC WITH DIFFERENTIAL/PLATELET
Basophils Absolute: 41 cells/uL (ref 0–200)
Basophils Relative: 0.5 %
Eosinophils Absolute: 213 cells/uL (ref 15–500)
Eosinophils Relative: 2.6 %
HCT: 39.6 % (ref 35.0–45.0)
Hemoglobin: 13.3 g/dL (ref 11.7–15.5)
Lymphs Abs: 1624 cells/uL (ref 850–3900)
MCH: 29.4 pg (ref 27.0–33.0)
MCHC: 33.6 g/dL (ref 32.0–36.0)
MCV: 87.4 fL (ref 80.0–100.0)
MPV: 11.4 fL (ref 7.5–12.5)
Monocytes Relative: 8.6 %
Neutro Abs: 5617 cells/uL (ref 1500–7800)
Neutrophils Relative %: 68.5 %
Platelets: 330 10*3/uL (ref 140–400)
RBC: 4.53 10*6/uL (ref 3.80–5.10)
RDW: 12.9 % (ref 11.0–15.0)
Total Lymphocyte: 19.8 %
WBC mixed population: 705 cells/uL (ref 200–950)
WBC: 8.2 10*3/uL (ref 3.8–10.8)

## 2018-10-27 ENCOUNTER — Other Ambulatory Visit: Payer: Self-pay

## 2018-10-27 MED ORDER — METHOTREXATE 2.5 MG PO TABS: ORAL_TABLET | ORAL | 0 refills | Status: DC

## 2018-10-27 NOTE — Progress Notes (Signed)
High LFTs. Pt is on MTX and statins. Please ask her to decrease MTX to 3 tabs po qwk.

## 2018-11-12 ENCOUNTER — Ambulatory Visit (INDEPENDENT_AMBULATORY_CARE_PROVIDER_SITE_OTHER): Payer: BLUE CROSS/BLUE SHIELD | Admitting: Rheumatology

## 2018-11-12 ENCOUNTER — Ambulatory Visit (INDEPENDENT_AMBULATORY_CARE_PROVIDER_SITE_OTHER): Payer: Self-pay

## 2018-11-12 DIAGNOSIS — M79672 Pain in left foot: Secondary | ICD-10-CM

## 2018-11-12 DIAGNOSIS — M79671 Pain in right foot: Secondary | ICD-10-CM | POA: Diagnosis not present

## 2018-11-20 ENCOUNTER — Other Ambulatory Visit: Payer: Self-pay | Admitting: Rheumatology

## 2018-12-01 ENCOUNTER — Other Ambulatory Visit: Payer: Self-pay | Admitting: *Deleted

## 2018-12-01 MED ORDER — METHOTREXATE 2.5 MG PO TABS: ORAL_TABLET | ORAL | 0 refills | Status: DC

## 2018-12-01 NOTE — Telephone Encounter (Signed)
Refill request received via fax   Last Visit: 08/29/18 Next visit: 12/10/18 Labs: 10/24/18 High LFTs. decrease MTX to 3 tabs po qwk  Okay to refill per Dr. Estanislado Pandy

## 2018-12-10 ENCOUNTER — Other Ambulatory Visit: Payer: BLUE CROSS/BLUE SHIELD | Admitting: Rheumatology

## 2018-12-15 ENCOUNTER — Other Ambulatory Visit: Payer: Self-pay | Admitting: Rheumatology

## 2019-01-16 NOTE — Progress Notes (Signed)
Office Visit Note  Patient: Alison [name]             Date of Birth: 1956-10-07           MRN: [id number]             PCP: Aletha Halim., PA-C Referring: Aletha Halim., PA-C Visit Date: 01/30/2019 Occupation: @GUAROCC @  Subjective:  Mouth dryness    History of Present Illness: Alison [name] is a 63 y.o. female with history of sarcoidosis.  Patient is on methotrexate 3 tablets by mouth once weekly and folic acid 1 mg by mouth daily.  She has not missed any doses recently.  She denies any recent infections.  She denies any recent flares.  She denies any shortness of breath, cough, or new pulmonary symptoms. She denies any swollen lymph nodes.  She is no longer being followed by pulmonology.  She has not noticed any difference since reducing the dose of methotrexate. She denies any joint pain or joint swelling at this time.  She states that her feet pain has resolved.  She denies any morning stiffness.She had ultrasound of bilateral feet performed on 11/12/2018 that did not reveal any synovitis. She reports she continues to have sicca symptoms.  She states that her mouth dryness has been severe.  She takes pilocarpine 5 mg 1 tablet by mouth daily.  She cannot tolerate taking more than 1 tablet daily due to experiencing swelling and drooling.  She is tried over-the-counter products in the past which have been ineffective.  She drinks water on a regular basis.  She goes to the dentist every 6 months and denies any recent dental caries.  She uses Visine as needed for eye drne She is no longer taking Fosamax.  She continues take calcium and vitamin D on a regular basis.  Activities of Daily Living:  Patient reports morning stiffness for 0 minutes.   Patient Denies nocturnal pain.  Difficulty dressing/grooming: Denies Difficulty climbing stairs: Denies Difficulty getting out of chair: Denies Difficulty using hands for taps, buttons, cutlery, and/or writing: Denies  Review of Systems   Constitutional: Negative for fatigue.  HENT: Positive for mouth dryness. Negative for mouth sores and nose dryness.   Eyes: Negative for pain, itching, visual disturbance and dryness.  Respiratory: Negative for cough, hemoptysis, shortness of breath, wheezing and difficulty breathing.   Cardiovascular: Negative for chest pain, palpitations, hypertension and swelling in legs/feet.  Gastrointestinal: Negative for abdominal pain, blood in stool, constipation, diarrhea, nausea and vomiting.  Endocrine: Negative for increased urination.  Genitourinary: Negative for painful urination, nocturia and pelvic pain.  Musculoskeletal: Negative for arthralgias, joint pain, joint swelling, myalgias, muscle weakness, morning stiffness, muscle tenderness and myalgias.  Skin: Negative for color change, pallor, rash, hair loss, nodules/bumps, redness, skin tightness, ulcers and sensitivity to sunlight.  Allergic/Immunologic: Negative for susceptible to infections.  Neurological: Negative for dizziness, light-headedness, numbness, headaches, memory loss and weakness.  Hematological: Negative for swollen glands.  Psychiatric/Behavioral: Negative for depressed mood, confusion and sleep disturbance. The patient is not nervous/anxious.     PMFS History:  Patient Active Problem List   Diagnosis Date Noted  . Hilar lymphadenopathy 03/16/2017  . High risk medication use 03/16/2017  . Age-related osteoporosis without current pathological fracture 03/16/2017  . ANA positive 03/11/2017  . Abnormal serum angiotensin-converting enzyme level 03/11/2017  . Chronic cough 01/22/2013  . Dysphagia 10/16/2012  . Sarcoidosis, lung, stage I 02/04/2012  . At risk for coronary artery disease 04/25/2011  .  Endometriosis   . Hypertension   . Hyperlipidemia   . RLS (restless legs syndrome)     Past Medical History:  Diagnosis Date  . Anemia    hx of due to heavy menstral cycles  . Chest pain   . Endometriosis   . GERD  (gastroesophageal reflux disease)   . Headache(784.0)    occassional headaches  . Hyperlipidemia   . Hypertension   . Inflammatory arthritis   . Mediastinal adenopathy March 2013   PET HOT, High ACE level, EBUS 02/26/12  . RLS (restless legs syndrome)   . Vitamin D deficiency     Family History  Problem Relation Age of Onset  . Heart disease Father   . Heart attack Father   . Hypertension Father   . Alzheimer's disease Father   . Asthma Mother   . Diabetes Mother        and sister, MGM  . Cancer Maternal Grandfather   . Hypertension Paternal Grandmother    Past Surgical History:  Procedure Laterality Date  . CESAREAN SECTION    . ENDOMETRIAL BIOPSY    . FINGER SURGERY     x2  . TONSILLECTOMY    . TUMOR REMOVAL     gastric schwannoma   Social History   Social History Narrative  . Not on file   Immunization History  Administered Date(s) Administered  . Influenza Split 09/03/2011, 10/03/2012  . Pneumococcal Polysaccharide-23 01/04/2012     Objective: Vital Signs: BP 125/77 (BP Location: Left Arm, Patient Position: Sitting, Cuff Size: Normal)   Pulse 86   Resp 13   Ht 5\' 7"  (1.702 m)   Wt 170 lb (77.1 kg)   BMI 26.63 kg/m    Physical Exam Vitals signs and nursing note reviewed.  Constitutional:      Appearance: She is well-developed.  HENT:     Head: Normocephalic and atraumatic.     Comments: No parotid swelling or tenderness.   Eyes:     Conjunctiva/sclera: Conjunctivae normal.  Neck:     Musculoskeletal: Normal range of motion.  Cardiovascular:     Rate and Rhythm: Normal rate and regular rhythm.     Heart sounds: Normal heart sounds.  Pulmonary:     Effort: Pulmonary effort is normal.     Breath sounds: Normal breath sounds.  Abdominal:     General: Bowel sounds are normal.     Palpations: Abdomen is soft.  Lymphadenopathy:     Cervical: No cervical adenopathy.  Skin:    General: Skin is warm and dry.     Capillary Refill: Capillary refill  takes less than 2 seconds.  Neurological:     Mental Status: She is alert and oriented to person, place, and time.  Psychiatric:        Behavior: Behavior normal.      Musculoskeletal Exam: C-spine, thoracic spine, lumbar spine good range of motion.  No midline spinal tenderness.  No SI joint tenderness.  Shoulder joints, with joints, wrist joints, MCPs, PIPs, DIPs good range of motion no synovitis.  She has complete fist formation bilaterally.  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  Hip joints, knee joint, ankle joints, MCPs, PIPs, DIPs good range of motion with no synovitis.  No warmth or effusion bilateral knee joints.  No tenderness or swelling of ankle joints.  No Achilles tendonitis or plantar fasciitis.  No tenderness over trochanteric bursa bilaterally.  CDAI Exam: CDAI Score: Not  documented Patient Global Assessment: Not documented; Provider Global Assessment: Not documented Swollen: Not documented; Tender: Not documented Joint Exam   Not documented   There is currently no information documented on the homunculus. Go to the Rheumatology activity and complete the homunculus joint exam.  Investigation: No additional findings.  Imaging: No results found.  Recent Labs: Lab Results  Component Value Date   WBC 8.2 10/24/2018   HGB 13.3 10/24/2018   PLT 330 10/24/2018   NA 137 10/24/2018   K 3.9 10/24/2018   CL 100 10/24/2018   CO2 30 10/24/2018   GLUCOSE 129 (H) 10/24/2018   BUN 7 10/24/2018   CREATININE 0.75 10/24/2018   BILITOT 0.6 10/24/2018   ALKPHOS 100 05/31/2017   AST 57 (H) 10/24/2018   ALT 79 (H) 10/24/2018   PROT 6.6 10/24/2018   ALBUMIN 4.2 05/31/2017   CALCIUM 9.4 10/24/2018   GFRAA 98 10/24/2018    Speciality Comments: No specialty comments available.  Procedures:  No procedures performed Allergies: Patient has no known allergies.      Assessment / Plan:     Visit Diagnoses: Sarcoidosis,  lung, stage I - +ACE, hilar lymphadenopathy: She has not had any recent flares or recurrences.  She has not had any coughing, shortness of breath, or new pulmonary symptoms.  She has no cervical lymphadenopathy.  She is no longer followed by pulmonology.  She continues take methotrexate 3 tablets by mouth once weekly and folic acid 1 mg by mouth daily.  A refill of methotrexate and folic acid will be sent to the pharmacy.  She was advised to notify us if she runs any signs or symptoms of a flare.  She will follow-up in 5 months.  High risk medication use - MTX 3 tablets by mouth once a week, folic acid 1 mg daily.  Most recent CBC/CMP within normal limits except for elevated LFTs on 10/24/18.  Patient was instructed to decrease methotrexate to 3 tablets weekly from 4 tablets.  CBC and CMP were drawn today. Standing orders placed.  She will return in May and every 3 months for lab work.- Plan: CBC with Differential/Platelet, COMPLETE METABOLIC PANEL WITH GFR, CBC with Differential/Platelet, COMPLETE METABOLIC PANEL WITH GFR  ANA positive - +Ro. She has chronic sicca symptoms.  She takes pilocarpine 5 mg 1 tablet po daily.  She cannot tolerate taking a higher dose to experiencing drooling and sweating.  We discussed using over-the-counter products which she states have been ineffective in the past.  She continues to drink water on a regular basis.  She uses Visine eyedrops daily PRN for eye dryness.  Hilar lymphadenopathy  Osteopenia of multiple sites - She was advised to discontinue Fosamax due to improvement in her bone density. BMD 0.636, T score -1.10 June 2018.  She continues take calcium and vitamin D and is daily basis.  Other medical conditions are listed as follows:   RLS (restless legs syndrome)  History of hyperlipidemia  History of hypertension   Orders: Orders Placed This Encounter  Procedures  . CBC with Differential/Platelet  . COMPLETE METABOLIC PANEL WITH GFR  . CBC with  Differential/Platelet  . COMPLETE METABOLIC PANEL WITH GFR   Meds ordered this encounter  Medications  . folic acid (FOLVITE) 1 MG tablet    Sig: Take 1 tablet (1 mg total) by mouth daily.    Dispense:  90 tablet    Refill:  3  . methotrexate (RHEUMATREX) 2.5 MG tablet    Sig: Take  3 tablets by mouth once weekly.    Dispense:  36 tablet    Refill:  0      Follow-Up Instructions: Return in about 5 months (around 06/30/2019) for Sarcoidosis.   Ofilia Neas, PA-C  I examined and evaluated the patient with Hazel Sams PA.  Patient had no synovitis on examination.  She was doing well on methotrexate 3 tablets/week.  She continues to have sicca symptoms.  She states 1 tablet of pilocarpine daily causes excessive salivation.  Although she is dry most of the day.  Have advised her to quit pilocarpine and to have him try half a tablet 3 times daily as tolerated.  The plan of care was discussed as noted above.  Bo Merino, MD  Note - This record has been created using Editor, commissioning.  Chart creation errors have been sought, but may not always  have been located. Such creation errors do not reflect on  the standard of medical care.

## 2019-01-30 ENCOUNTER — Encounter: Payer: Self-pay | Admitting: Rheumatology

## 2019-01-30 ENCOUNTER — Ambulatory Visit: Payer: BLUE CROSS/BLUE SHIELD | Admitting: Rheumatology

## 2019-01-30 VITALS — BP 125/77 | HR 86 | Resp 13 | Ht 67.0 in | Wt 170.0 lb

## 2019-01-30 DIAGNOSIS — Z79899 Other long term (current) drug therapy: Secondary | ICD-10-CM | POA: Diagnosis not present

## 2019-01-30 DIAGNOSIS — D860 Sarcoidosis of lung: Secondary | ICD-10-CM

## 2019-01-30 DIAGNOSIS — G2581 Restless legs syndrome: Secondary | ICD-10-CM

## 2019-01-30 DIAGNOSIS — M8589 Other specified disorders of bone density and structure, multiple sites: Secondary | ICD-10-CM

## 2019-01-30 DIAGNOSIS — R768 Other specified abnormal immunological findings in serum: Secondary | ICD-10-CM

## 2019-01-30 DIAGNOSIS — R590 Localized enlarged lymph nodes: Secondary | ICD-10-CM | POA: Diagnosis not present

## 2019-01-30 DIAGNOSIS — Z8679 Personal history of other diseases of the circulatory system: Secondary | ICD-10-CM

## 2019-01-30 DIAGNOSIS — Z8639 Personal history of other endocrine, nutritional and metabolic disease: Secondary | ICD-10-CM

## 2019-01-30 MED ORDER — METHOTREXATE 2.5 MG PO TABS: ORAL_TABLET | ORAL | 0 refills | Status: DC

## 2019-01-30 MED ORDER — FOLIC ACID 1 MG PO TABS: 1.0000 mg | ORAL_TABLET | Freq: Every day | ORAL | 3 refills | Status: DC

## 2019-01-30 NOTE — Patient Instructions (Signed)
Standing Labs We placed an order today for your standing lab work.    Please come back and get your standing labs in May and every 3 months  We have open lab Monday through Friday from 8:30-11:30 AM and 1:30-4:00 PM  at the office of Dr. Shaili Deveshwar.   You may experience shorter wait times on Monday and Friday afternoons. The office is located at [address], [address], [place], [place] [address] No appointment is necessary.   Labs are drawn by [facility].  You may receive a bill from [facility] for your lab work.  If you wish to have your labs drawn at another location, please call the office 24 hours in advance to send orders.  If you have any questions regarding directions or hours of operation,  please call [phone].   Just as a reminder please drink plenty of water prior to coming for your lab work. Thanks!  

## 2019-01-31 LAB — COMPLETE METABOLIC PANEL WITH GFR
AG Ratio: 1.6 (calc) (ref 1.0–2.5)
ALT: 102 U/L — ABNORMAL HIGH (ref 6–29)
AST: 71 U/L — ABNORMAL HIGH (ref 10–35)
Albumin: 4.1 g/dL (ref 3.6–5.1)
Alkaline phosphatase (APISO): 114 U/L (ref 37–153)
BUN: 8 mg/dL (ref 7–25)
CO2: 29 mmol/L (ref 20–32)
Calcium: 9.6 mg/dL (ref 8.6–10.4)
Chloride: 101 mmol/L (ref 98–110)
Creat: 0.7 mg/dL (ref 0.50–0.99)
GFR, Est African American: 107 mL/min/{1.73_m2} (ref >=60)
GFR, Est Non African American: 92 mL/min/{1.73_m2} (ref >=60)
Globulin: 2.5 g/dL (calc) (ref 1.9–3.7)
Glucose, Bld: 123 mg/dL — ABNORMAL HIGH (ref 65–99)
Potassium: 4.1 mmol/L (ref 3.5–5.3)
Sodium: 139 mmol/L (ref 135–146)
Total Bilirubin: 0.9 mg/dL (ref 0.2–1.2)
Total Protein: 6.6 g/dL (ref 6.1–8.1)

## 2019-01-31 LAB — CBC WITH DIFFERENTIAL/PLATELET
Absolute Monocytes: 526 cells/uL (ref 200–950)
Basophils Absolute: 43 cells/uL (ref 0–200)
Basophils Relative: 0.6 %
Eosinophils Absolute: 209 cells/uL (ref 15–500)
Eosinophils Relative: 2.9 %
HCT: 40.6 % (ref 35.0–45.0)
Hemoglobin: 13.3 g/dL (ref 11.7–15.5)
Lymphs Abs: 1627 cells/uL (ref 850–3900)
MCH: 28.2 pg (ref 27.0–33.0)
MCHC: 32.8 g/dL (ref 32.0–36.0)
MCV: 86 fL (ref 80.0–100.0)
MPV: 11.4 fL (ref 7.5–12.5)
Monocytes Relative: 7.3 %
Neutro Abs: 4795 cells/uL (ref 1500–7800)
Neutrophils Relative %: 66.6 %
Platelets: 315 10*3/uL (ref 140–400)
RBC: 4.72 10*6/uL (ref 3.80–5.10)
RDW: 12.8 % (ref 11.0–15.0)
Total Lymphocyte: 22.6 %
WBC: 7.2 10*3/uL (ref 3.8–10.8)

## 2019-02-02 ENCOUNTER — Telehealth: Payer: Self-pay | Admitting: *Deleted

## 2019-02-02 DIAGNOSIS — Z79899 Other long term (current) drug therapy: Secondary | ICD-10-CM

## 2019-02-02 NOTE — Progress Notes (Signed)
Please advise patient to discontinue methotrexate due to elevated LFTs.  Advised her to not take any NSAIDs or drink alcohol.  Please schedule a follow-up visit in 1 month.  She should get CMP in 3 weeks

## 2019-02-02 NOTE — Telephone Encounter (Signed)
-----   Message from Bo Merino, MD sent at 02/02/2019 12:56 PM EST ----- Please advise patient to discontinue methotrexate due to elevated LFTs.  Advised her to not take any NSAIDs or drink alcohol.  Please schedule a follow-up visit in 1 month.  She should get CMP in 3 weeks

## 2019-03-11 ENCOUNTER — Ambulatory Visit: Payer: BLUE CROSS/BLUE SHIELD | Admitting: Rheumatology

## 2019-03-12 ENCOUNTER — Other Ambulatory Visit: Payer: Self-pay | Admitting: Rheumatology

## 2019-03-12 NOTE — Telephone Encounter (Signed)
Patient no longer on MTX. Discontinued 02/02/19

## 2019-03-23 ENCOUNTER — Other Ambulatory Visit: Payer: Self-pay

## 2019-03-23 ENCOUNTER — Telehealth: Payer: Self-pay | Admitting: Rheumatology

## 2019-03-23 DIAGNOSIS — Z79899 Other long term (current) drug therapy: Secondary | ICD-10-CM

## 2019-03-23 NOTE — Telephone Encounter (Signed)
Opened in error

## 2019-03-23 NOTE — Telephone Encounter (Signed)
Patient going to [facility] on [address] for [employer]. Please release orders.

## 2019-03-23 NOTE — Telephone Encounter (Signed)
Lab orders have been released for [facility].  

## 2019-03-25 ENCOUNTER — Telehealth: Payer: Self-pay | Admitting: *Deleted

## 2019-03-25 LAB — CBC WITH DIFFERENTIAL/PLATELET
Absolute Monocytes: 631 cells/uL (ref 200–950)
Basophils Absolute: 54 cells/uL (ref 0–200)
Basophils Relative: 0.7 %
Eosinophils Absolute: 323 cells/uL (ref 15–500)
Eosinophils Relative: 4.2 %
HCT: 41.1 % (ref 35.0–45.0)
Hemoglobin: 13.9 g/dL (ref 11.7–15.5)
Lymphs Abs: 2449 cells/uL (ref 850–3900)
MCH: 29.1 pg (ref 27.0–33.0)
MCHC: 33.8 g/dL (ref 32.0–36.0)
MCV: 86 fL (ref 80.0–100.0)
MPV: 11.6 fL (ref 7.5–12.5)
Monocytes Relative: 8.2 %
Neutro Abs: 4243 cells/uL (ref 1500–7800)
Neutrophils Relative %: 55.1 %
Platelets: 326 10*3/uL (ref 140–400)
RBC: 4.78 10*6/uL (ref 3.80–5.10)
RDW: 12.1 % (ref 11.0–15.0)
Total Lymphocyte: 31.8 %
WBC: 7.7 10*3/uL (ref 3.8–10.8)

## 2019-03-25 LAB — COMPLETE METABOLIC PANEL WITH GFR
AG Ratio: 1.5 (calc) (ref 1.0–2.5)
ALT: 63 U/L — ABNORMAL HIGH (ref 6–29)
AST: 48 U/L — ABNORMAL HIGH (ref 10–35)
Albumin: 4.1 g/dL (ref 3.6–5.1)
Alkaline phosphatase (APISO): 114 U/L (ref 37–153)
BUN: 8 mg/dL (ref 7–25)
CO2: 32 mmol/L (ref 20–32)
Calcium: 9.7 mg/dL (ref 8.6–10.4)
Chloride: 101 mmol/L (ref 98–110)
Creat: 0.73 mg/dL (ref 0.50–0.99)
GFR, Est African American: 102 mL/min/{1.73_m2} (ref >=60)
GFR, Est Non African American: 88 mL/min/{1.73_m2} (ref >=60)
Globulin: 2.7 g/dL (calc) (ref 1.9–3.7)
Glucose, Bld: 122 mg/dL — ABNORMAL HIGH (ref 65–99)
Potassium: 4.4 mmol/L (ref 3.5–5.3)
Sodium: 138 mmol/L (ref 135–146)
Total Bilirubin: 0.8 mg/dL (ref 0.2–1.2)
Total Protein: 6.8 g/dL (ref 6.1–8.1)

## 2019-03-25 NOTE — Progress Notes (Signed)
I discussed lab results with patient.  She has been off methotrexate.  I have advised her to stay off methotrexate for right now.  She has not noticed any worsening of her symptoms.  She has been advised not to take Tylenol, anti-inflammatories or drink alcohol.  We will repeat CMP in 3 to 4 months.

## 2019-03-25 NOTE — Telephone Encounter (Signed)
Standing orders already in place

## 2019-03-25 NOTE — Telephone Encounter (Signed)
-----   Message from Bo Merino, MD sent at 03/25/2019 10:41 AM EDT ----- I discussed lab results with patient.  She has been off methotrexate.  I have advised her to stay off methotrexate for right now.  She has not noticed any worsening of her symptoms.  She has been advised not to take Tylenol, anti-inflammatories or dr ink alcohol.  We will repeat CMP in 3 to 4 months.

## 2019-06-22 ENCOUNTER — Other Ambulatory Visit: Payer: Self-pay

## 2019-06-22 ENCOUNTER — Telehealth: Payer: Self-pay | Admitting: Rheumatology

## 2019-06-22 DIAGNOSIS — Z79899 Other long term (current) drug therapy: Secondary | ICD-10-CM

## 2019-06-22 NOTE — Telephone Encounter (Signed)
Patient called requesting her labwork orders be sent to [facility] on [address].  Patient states she is scheduled to go on Thursday, 08/26/19.

## 2019-06-22 NOTE — Telephone Encounter (Signed)
Advised patient that lab orders have been released for [facility]. Patient verbalized understanding.

## 2019-06-25 NOTE — Progress Notes (Deleted)
Office Visit Note  Patient: Alison [name]             Date of Birth: 12-05-1955           MRN: [id number]             PCP: Aletha Halim., PA-C Referring: Aletha Halim., PA-C Visit Date: 07/09/2019 Occupation: @GUAROCC @  Subjective:  No chief complaint on file.   History of Present Illness: Alison [name] is a 63 y.o. female ***   Activities of Daily Living:  Patient reports morning stiffness for *** {minute/hour:19697}.   Patient {ACTIONS;DENIES/REPORTS:21021675::"Denies"} nocturnal pain.  Difficulty dressing/grooming: {ACTIONS;DENIES/REPORTS:21021675::"Denies"} Difficulty climbing stairs: {ACTIONS;DENIES/REPORTS:21021675::"Denies"} Difficulty getting out of chair: {ACTIONS;DENIES/REPORTS:21021675::"Denies"} Difficulty using hands for taps, buttons, cutlery, and/or writing: {ACTIONS;DENIES/REPORTS:21021675::"Denies"}  No Rheumatology ROS completed.   PMFS History:  Patient Active Problem List   Diagnosis Date Noted  . Hilar lymphadenopathy 03/16/2017  . High risk medication use 03/16/2017  . Age-related osteoporosis without current pathological fracture 03/16/2017  . ANA positive 03/11/2017  . Abnormal serum angiotensin-converting enzyme level 03/11/2017  . Chronic cough 01/22/2013  . Dysphagia 10/16/2012  . Sarcoidosis, lung, stage I 02/04/2012  . At risk for coronary artery disease 04/25/2011  . Endometriosis   . Hypertension   . Hyperlipidemia   . RLS (restless legs syndrome)     Past Medical History:  Diagnosis Date  . Anemia    hx of due to heavy menstral cycles  . Chest pain   . Endometriosis   . GERD (gastroesophageal reflux disease)   . Headache(784.0)    occassional headaches  . Hyperlipidemia   . Hypertension   . Inflammatory arthritis   . Mediastinal adenopathy March 2013   PET HOT, High ACE level, EBUS 02/26/12  . RLS (restless legs syndrome)   . Vitamin D deficiency     Family History  Problem Relation Age of Onset  . Heart  disease Father   . Heart attack Father   . Hypertension Father   . Alzheimer's disease Father   . Asthma Mother   . Diabetes Mother        and sister, MGM  . Cancer Maternal Grandfather   . Hypertension Paternal Grandmother    Past Surgical History:  Procedure Laterality Date  . CESAREAN SECTION    . ENDOMETRIAL BIOPSY    . FINGER SURGERY     x2  . TONSILLECTOMY    . TUMOR REMOVAL     gastric schwannoma   Social History   Social History Narrative  . Not on file   Immunization History  Administered Date(s) Administered  . Influenza Split 09/03/2011, 10/03/2012  . Pneumococcal Polysaccharide-23 01/04/2012     Objective: Vital Signs: There were no vitals taken for this visit.   Physical Exam   Musculoskeletal Exam: ***  CDAI Exam: CDAI Score: - Patient Global: -; Provider Global: - Swollen: -; Tender: - Joint Exam   No joint exam has been documented for this visit   There is currently no information documented on the homunculus. Go to the Rheumatology activity and complete the homunculus joint exam.  Investigation: No additional findings.  Imaging: No results found.  Recent Labs: Lab Results  Component Value Date   WBC 7.7 03/24/2019   HGB 13.9 03/24/2019   PLT 326 03/24/2019   NA 138 03/24/2019   K 4.4 03/24/2019   CL 101 03/24/2019   CO2 32 03/24/2019   GLUCOSE 122 (H) 03/24/2019   BUN 8 03/24/2019  CREATININE 0.73 03/24/2019   BILITOT 0.8 03/24/2019   ALKPHOS 100 05/31/2017   AST 48 (H) 03/24/2019   ALT 63 (H) 03/24/2019   PROT 6.8 03/24/2019   ALBUMIN 4.2 05/31/2017   CALCIUM 9.7 03/24/2019   GFRAA 102 03/24/2019    Speciality Comments: No specialty comments available.  Procedures:  No procedures performed Allergies: Patient has no known allergies.   Assessment / Plan:     Visit Diagnoses: No diagnosis found.  Orders: No orders of the defined types were placed in this encounter.  No orders of the defined types were placed in  this encounter.   Face-to-face time spent with patient was *** minutes. Greater than 50% of time was spent in counseling and coordination of care.  Follow-Up Instructions: No follow-ups on file.   Earnestine Mealing, CMA  Note - This record has been created using Editor, commissioning.  Chart creation errors have been sought, but may not always  have been located. Such creation errors do not reflect on  the standard of medical care.

## 2019-06-26 LAB — COMPLETE METABOLIC PANEL WITH GFR
AG Ratio: 1.4 (calc) (ref 1.0–2.5)
ALT: 44 U/L — ABNORMAL HIGH (ref 6–29)
AST: 33 U/L (ref 10–35)
Albumin: 3.8 g/dL (ref 3.6–5.1)
Alkaline phosphatase (APISO): 114 U/L (ref 37–153)
BUN: 10 mg/dL (ref 7–25)
CO2: 25 mmol/L (ref 20–32)
Calcium: 9.5 mg/dL (ref 8.6–10.4)
Chloride: 98 mmol/L (ref 98–110)
Creat: 0.68 mg/dL (ref 0.50–0.99)
GFR, Est African American: 108 mL/min/{1.73_m2} (ref >=60)
GFR, Est Non African American: 93 mL/min/{1.73_m2} (ref >=60)
Globulin: 2.7 g/dL (calc) (ref 1.9–3.7)
Glucose, Bld: 281 mg/dL — ABNORMAL HIGH (ref 65–139)
Potassium: 3.9 mmol/L (ref 3.5–5.3)
Sodium: 135 mmol/L (ref 135–146)
Total Bilirubin: 0.6 mg/dL (ref 0.2–1.2)
Total Protein: 6.5 g/dL (ref 6.1–8.1)

## 2019-06-26 LAB — CBC WITH DIFFERENTIAL/PLATELET
Absolute Monocytes: 523 cells/uL (ref 200–950)
Basophils Absolute: 38 cells/uL (ref 0–200)
Basophils Relative: 0.6 %
Eosinophils Absolute: 239 cells/uL (ref 15–500)
Eosinophils Relative: 3.8 %
HCT: 40.2 % (ref 35.0–45.0)
Hemoglobin: 13.2 g/dL (ref 11.7–15.5)
Lymphs Abs: 1562 cells/uL (ref 850–3900)
MCH: 28.2 pg (ref 27.0–33.0)
MCHC: 32.8 g/dL (ref 32.0–36.0)
MCV: 85.9 fL (ref 80.0–100.0)
MPV: 11.9 fL (ref 7.5–12.5)
Monocytes Relative: 8.3 %
Neutro Abs: 3938 cells/uL (ref 1500–7800)
Neutrophils Relative %: 62.5 %
Platelets: 311 10*3/uL (ref 140–400)
RBC: 4.68 10*6/uL (ref 3.80–5.10)
RDW: 12.4 % (ref 11.0–15.0)
Total Lymphocyte: 24.8 %
WBC: 6.3 10*3/uL (ref 3.8–10.8)

## 2019-07-02 ENCOUNTER — Ambulatory Visit: Payer: Self-pay | Admitting: Rheumatology

## 2019-07-09 ENCOUNTER — Ambulatory Visit: Payer: BLUE CROSS/BLUE SHIELD | Admitting: Physician Assistant

## 2019-08-03 NOTE — Progress Notes (Signed)
Office Visit Note  Patient: Alison [name]             Date of Birth: May 29, 1956           MRN: [id number]             PCP: Aletha Halim., PA-C Referring: Aletha Halim., PA-C Visit Date: 08/17/2019 Occupation: @GUAROCC @  Subjective:  Discuss lab work    History of Present Illness: Alison [name] is a 63 y.o. female with history of sarcoidosis and positive ANA.  She discontinued Methotrexate 3 tablets by mouth once weekly and folic acid 1 mg po daily in February 2020 due to elevated LFTs.  She has not developed any signs or symptoms of a flare.  She denies any increased joint pain or joint swelling.  She has occasional ankle joint pain but no tenderness or warmth at this time.  She has intermittent pain in both hands but denies any joint swelling.  She denies any new or worsening pulmonary symptoms.  She denies any shortness of breath or coughing.  She denies any swollen lymph nodes.  She denies any rashes or nodules.     Activities of Daily Living:  Patient reports morning stiffness for 0 minutes.   Patient Denies nocturnal pain.  Difficulty dressing/grooming: Denies Difficulty climbing stairs: Denies Difficulty getting out of chair: Denies Difficulty using hands for taps, buttons, cutlery, and/or writing: Denies  Review of Systems  Constitutional: Negative for fatigue.  HENT: Positive for mouth dryness. Negative for mouth sores, trouble swallowing, trouble swallowing and nose dryness.   Eyes: Negative for pain, itching, visual disturbance and dryness.  Respiratory: Negative for cough, hemoptysis, shortness of breath, wheezing and difficulty breathing.   Cardiovascular: Negative for chest pain, palpitations, hypertension and swelling in legs/feet.  Gastrointestinal: Negative for abdominal pain, blood in stool, constipation and diarrhea.  Endocrine: Negative for increased urination.  Genitourinary: Negative for difficulty urinating and painful urination.    Musculoskeletal: Positive for arthralgias and joint pain. Negative for joint swelling, myalgias, muscle weakness, morning stiffness, muscle tenderness and myalgias.  Skin: Negative for color change, pallor, rash, hair loss, nodules/bumps, skin tightness, ulcers and sensitivity to sunlight.  Allergic/Immunologic: Negative for susceptible to infections.  Neurological: Negative for dizziness, numbness, headaches, memory loss and weakness.  Hematological: Negative for bruising/bleeding tendency and swollen glands.  Psychiatric/Behavioral: Negative for depressed mood, confusion and sleep disturbance. The patient is not nervous/anxious.     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   At risk for coronary artery disease 04/25/2011   Endometriosis    Hypertension    Hyperlipidemia    RLS (restless legs syndrome)     Past Medical History:  Diagnosis Date   Anemia    hx of due to heavy menstral cycles   Chest pain    Endometriosis    GERD (gastroesophageal reflux disease)    Headache(784.0)    occassional headaches   Hyperlipidemia    Hypertension    Inflammatory arthritis    Mediastinal adenopathy March 2013   PET HOT, High ACE level, EBUS 02/26/12   RLS (restless legs syndrome)    Vitamin D deficiency  Family History  Problem Relation Age of Onset   Heart disease Father    Heart attack Father    Hypertension Father    Alzheimer's disease Father    Asthma Mother    Diabetes Mother        and sister, MGM   Cancer Maternal Grandfather    Hypertension Paternal Grandmother    Past Surgical  History:  Procedure Laterality Date   CESAREAN SECTION     ENDOMETRIAL BIOPSY     FINGER SURGERY     x2   TONSILLECTOMY     TUMOR REMOVAL     gastric schwannoma   Social History   Social History Narrative   Not on file   Immunization History  Administered Date(s) Administered   Influenza Split 09/03/2011, 10/03/2012   Influenza, Quadrivalent, Recombinant, Inj, Pf 08/14/2019   Pneumococcal Polysaccharide-23 01/04/2012   Zoster Recombinat (Shingrix) 06/20/2018, 03/07/2019     Objective: Vital Signs: BP 120/83 (BP Location: Left Arm, Patient Position: Sitting, Cuff Size: Normal)    Pulse 84    Resp 12    Ht 5' 6.5" (1.689 m)    Wt 171 lb (77.6 kg)    BMI 27.19 kg/m    Physical Exam Vitals signs and nursing note reviewed.  Constitutional:      Appearance: She is well-developed.  HENT:     Head: Normocephalic and atraumatic.  Eyes:     Conjunctiva/sclera: Conjunctivae normal.  Neck:     Musculoskeletal: Normal range of motion.  Cardiovascular:     Rate and Rhythm: Normal rate and regular rhythm.     Heart sounds: Normal heart sounds.  Pulmonary:     Effort: Pulmonary effort is normal.     Breath sounds: Normal breath sounds.  Abdominal:     General: Bowel sounds are normal.     Palpations: Abdomen is soft.  Lymphadenopathy:     Cervical: No cervical adenopathy.  Skin:    General: Skin is warm and dry.     Capillary Refill: Capillary refill takes less than 2 seconds.  Neurological:     Mental Status: She is alert and oriented to person, place, and time.  Psychiatric:        Behavior: Behavior normal.      Musculoskeletal Exam: C-spine, thoracic spine, and lumbar spine good ROM.  No midline spinal tenderness.  No SI joint tenderness.  Shoulder joints, elbow joints, wrist joints, MCPs, PIPs, and DIPs good ROM with no synovitis.  Complete fist formation bilaterally.  Hip joints, knee joints, ankle joints, MTPs, PIPs, and DIPs good ROM with no synovitis.  No  warmth or effusion of knee joints.  No tenderness or swelling of ankle joints.    CDAI Exam: CDAI Score: -- Patient Global: --; Provider Global: -- Swollen: --; Tender: -- Joint Exam   No joint exam has been documented for this visit   There is currently no information documented on the homunculus. Go to the Rheumatology activity and complete the homunculus joint exam.  Investigation: No additional findings.  Imaging: No results found.  Recent Labs: Lab Results  Component Value Date   WBC 6.3 06/25/2019   HGB 13.2 06/25/2019   PLT 311 06/25/2019   NA 135 06/25/2019   K 3.9 06/25/2019   CL 98 06/25/2019   CO2 25 06/25/2019   GLUCOSE 281 (H) 06/25/2019   BUN 10 06/25/2019   CREATININE 0.68 06/25/2019   BILITOT 0.6 06/25/2019   ALKPHOS 100 05/31/2017   AST  33 06/25/2019   ALT 44 (H) 06/25/2019   PROT 6.5 06/25/2019   ALBUMIN 4.2 05/31/2017   CALCIUM 9.5 06/25/2019   GFRAA 108 06/25/2019    Speciality Comments: No specialty comments available.  Procedures:  No procedures performed Allergies: Patient has no known allergies.    Assessment / Plan:     Visit Diagnoses: Sarcoidosis, lung, stage I - +ACE, hilar lymphadenopathy: She has not had any signs or symptoms of a flare recently.  She has not had any shortness of breath, coughing, or pulmonary symptoms.  She is no longer following up with a pulmonologist.  She was previously taking methotrexate 3 tablets by mouth once weekly and folic acid 1 mg by mouth daily but discontinued in February 2020 due to elevated LFTs.  She has not noticed any new or worsening symptoms since discontinuing methotrexate.  She was advised to notify us if she develops signs or symptoms of a flare.  She will follow-up in the office in 6 months.  We will recheck a hepatic panel at that time.  Hilar lymphadenopathy  High risk medication use -Discontinued Methotrexate 3 tablets po once weekly and folic acid in February 2020 due to elevated LFTs.   LFTs have been trending down since she discontinued MTX.  She denies any tylenol or NSAID use.  She drinks alcohol "socially."    ANA positive - +Ro: She has chronic mouth dryness, but she has no clinical features of autoimmune disease at this time.  She discontinued pilocarpine due to not noticing much improvement in sicca symptoms.  According to the patient her sicca symptoms have improved since discontinuing MTX.  She was advised to notify us if she develops any new or worsening.   Osteopenia of multiple sites: She is on a calcium and vitamin D supplement daily. Previously treated with Fosamax (November 2017 to July 2019). Last DEXA on 06/06/2018 showed T score -1.9 right femur neck and positive ^% change in BMD at left femur neck.  RLS (restless legs syndrome): She experiencing symptoms of RLS at night.  She was advised to follow up with PCP.    Other medical conditions are listed as follows:   History of hyperlipidemia  History of hypertension  Orders: Orders Placed This Encounter  Procedures   Hepatic function panel   No orders of the defined types were placed in this encounter.    Follow-Up Instructions: Return in about 6 months (around 02/14/2020) for Sarcoidosis.   Ofilia Neas, PA-C  Note - This record has been created using Dragon software.  Chart creation errors have been sought, but may not always  have been located. Such creation errors do not reflect on  the standard of medical care.

## 2019-08-17 ENCOUNTER — Other Ambulatory Visit: Payer: Self-pay

## 2019-08-17 ENCOUNTER — Ambulatory Visit (INDEPENDENT_AMBULATORY_CARE_PROVIDER_SITE_OTHER): Payer: BC Managed Care – PPO | Admitting: Physician Assistant

## 2019-08-17 ENCOUNTER — Encounter: Payer: Self-pay | Admitting: Physician Assistant

## 2019-08-17 VITALS — BP 120/83 | HR 84 | Resp 12 | Ht 66.5 in | Wt 171.0 lb

## 2019-08-17 DIAGNOSIS — R768 Other specified abnormal immunological findings in serum: Secondary | ICD-10-CM

## 2019-08-17 DIAGNOSIS — D860 Sarcoidosis of lung: Secondary | ICD-10-CM

## 2019-08-17 DIAGNOSIS — R7989 Other specified abnormal findings of blood chemistry: Secondary | ICD-10-CM

## 2019-08-17 DIAGNOSIS — R590 Localized enlarged lymph nodes: Secondary | ICD-10-CM

## 2019-08-17 DIAGNOSIS — Z79899 Other long term (current) drug therapy: Secondary | ICD-10-CM | POA: Diagnosis not present

## 2019-08-17 DIAGNOSIS — G2581 Restless legs syndrome: Secondary | ICD-10-CM

## 2019-08-17 DIAGNOSIS — Z8679 Personal history of other diseases of the circulatory system: Secondary | ICD-10-CM

## 2019-08-17 DIAGNOSIS — R945 Abnormal results of liver function studies: Secondary | ICD-10-CM

## 2019-08-17 DIAGNOSIS — Z8639 Personal history of other endocrine, nutritional and metabolic disease: Secondary | ICD-10-CM

## 2019-08-17 DIAGNOSIS — M8589 Other specified disorders of bone density and structure, multiple sites: Secondary | ICD-10-CM

## 2020-02-08 NOTE — Progress Notes (Deleted)
Office Visit Note  Patient: Alison [name]             Date of Birth: 1956/08/01           MRN: [id number]             PCP: Aletha Halim., PA-C Referring: Aletha Halim., PA-C Visit Date: 02/15/2020 Occupation: @GUAROCC @  Subjective:  No chief complaint on file.   History of Present Illness: Alison [name] is a 64 y.o. female ***   Activities of Daily Living:  Patient reports morning stiffness for *** {minute/hour:19697}.   Patient {ACTIONS;DENIES/REPORTS:21021675::"Denies"} nocturnal pain.  Difficulty dressing/grooming: {ACTIONS;DENIES/REPORTS:21021675::"Denies"} Difficulty climbing stairs: {ACTIONS;DENIES/REPORTS:21021675::"Denies"} Difficulty getting out of chair: {ACTIONS;DENIES/REPORTS:21021675::"Denies"} Difficulty using hands for taps, buttons, cutlery, and/or writing: {ACTIONS;DENIES/REPORTS:21021675::"Denies"}  No Rheumatology ROS completed.   PMFS History:  Patient Active Problem List   Diagnosis Date Noted  . Hilar lymphadenopathy 03/16/2017  . High risk medication use 03/16/2017  . Age-related osteoporosis without current pathological fracture 03/16/2017  . ANA positive 03/11/2017  . Abnormal serum angiotensin-converting enzyme level 03/11/2017  . Chronic cough 01/22/2013  . Dysphagia 10/16/2012  . Sarcoidosis, lung, stage I 02/04/2012  . At risk for coronary artery disease 04/25/2011  . Endometriosis   . Hypertension   . Hyperlipidemia   . RLS (restless legs syndrome)     Past Medical History:  Diagnosis Date  . Anemia    hx of due to heavy menstral cycles  . Chest pain   . Endometriosis   . GERD (gastroesophageal reflux disease)   . Headache(784.0)    occassional headaches  . Hyperlipidemia   . Hypertension   . Inflammatory arthritis   . Mediastinal adenopathy March 2013   PET HOT, High ACE level, EBUS 02/26/12  . RLS (restless legs syndrome)   . Vitamin D deficiency     Family History  Problem Relation Age of Onset  . Heart  disease Father   . Heart attack Father   . Hypertension Father   . Alzheimer's disease Father   . Asthma Mother   . Diabetes Mother        and sister, MGM  . Cancer Maternal Grandfather   . Hypertension Paternal Grandmother    Past Surgical History:  Procedure Laterality Date  . CESAREAN SECTION    . ENDOMETRIAL BIOPSY    . FINGER SURGERY     x2  . TONSILLECTOMY    . TUMOR REMOVAL     gastric schwannoma   Social History   Social History Narrative  . Not on file   Immunization History  Administered Date(s) Administered  . Influenza Split 09/03/2011, 10/03/2012  . Influenza, Quadrivalent, Recombinant, Inj, Pf 08/14/2019  . Pneumococcal Polysaccharide-23 01/04/2012  . Zoster Recombinat (Shingrix) 06/20/2018, 03/07/2019     Objective: Vital Signs: There were no vitals taken for this visit.   Physical Exam   Musculoskeletal Exam: ***  CDAI Exam: CDAI Score: - Patient Global: -; Provider Global: - Swollen: -; Tender: - Joint Exam 02/15/2020   No joint exam has been documented for this visit   There is currently no information documented on the homunculus. Go to the Rheumatology activity and complete the homunculus joint exam.  Investigation: No additional findings.  Imaging: No results found.  Recent Labs: Lab Results  Component Value Date   WBC 6.3 06/25/2019   HGB 13.2 06/25/2019   PLT 311 06/25/2019   NA 135 06/25/2019   K 3.9 06/25/2019   CL 98 06/25/2019  CO2 25 06/25/2019   GLUCOSE 281 (H) 06/25/2019   BUN 10 06/25/2019   CREATININE 0.68 06/25/2019   BILITOT 0.6 06/25/2019   ALKPHOS 100 05/31/2017   AST 33 06/25/2019   ALT 44 (H) 06/25/2019   PROT 6.5 06/25/2019   ALBUMIN 4.2 05/31/2017   CALCIUM 9.5 06/25/2019   GFRAA 108 06/25/2019    Speciality Comments: No specialty comments available.  Procedures:  No procedures performed Allergies: Patient has no known allergies.   Assessment / Plan:     Visit Diagnoses: No diagnosis found.   Orders: No orders of the defined types were placed in this encounter.  No orders of the defined types were placed in this encounter.   Face-to-face time spent with patient was *** minutes. Greater than 50% of time was spent in counseling and coordination of care.  Follow-Up Instructions: No follow-ups on file.   Ofilia Neas, PA-C  Note - This record has been created using Dragon software.  Chart creation errors have been sought, but may not always  have been located. Such creation errors do not reflect on  the standard of medical care.

## 2020-02-15 ENCOUNTER — Ambulatory Visit: Payer: BC Managed Care – PPO | Admitting: Physician Assistant

## 2020-04-06 NOTE — Progress Notes (Deleted)
Office Visit Note  Patient: Alison [name]             Date of Birth: Oct 10, 1956           MRN: [id number]             PCP: Aletha Halim., PA-C Referring: Aletha Halim., PA-C Visit Date: 04/14/2020 Occupation: @GUAROCC @  Subjective:  No chief complaint on file.   History of Present Illness: Alison [name] is a 64 y.o. female ***   Activities of Daily Living:  Patient reports morning stiffness for *** {minute/hour:19697}.   Patient {ACTIONS;DENIES/REPORTS:21021675::"Denies"} nocturnal pain.  Difficulty dressing/grooming: {ACTIONS;DENIES/REPORTS:21021675::"Denies"} Difficulty climbing stairs: {ACTIONS;DENIES/REPORTS:21021675::"Denies"} Difficulty getting out of chair: {ACTIONS;DENIES/REPORTS:21021675::"Denies"} Difficulty using hands for taps, buttons, cutlery, and/or writing: {ACTIONS;DENIES/REPORTS:21021675::"Denies"}  No Rheumatology ROS completed.   PMFS History:  Patient Active Problem List   Diagnosis Date Noted  . Hilar lymphadenopathy 03/16/2017  . High risk medication use 03/16/2017  . Age-related osteoporosis without current pathological fracture 03/16/2017  . ANA positive 03/11/2017  . Abnormal serum angiotensin-converting enzyme level 03/11/2017  . Chronic cough 01/22/2013  . Dysphagia 10/16/2012  . Sarcoidosis, lung, stage I 02/04/2012  . At risk for coronary artery disease 04/25/2011  . Endometriosis   . Hypertension   . Hyperlipidemia   . RLS (restless legs syndrome)     Past Medical History:  Diagnosis Date  . Anemia    hx of due to heavy menstral cycles  . Chest pain   . Endometriosis   . GERD (gastroesophageal reflux disease)   . Headache(784.0)    occassional headaches  . Hyperlipidemia   . Hypertension   . Inflammatory arthritis   . Mediastinal adenopathy March 2013   PET HOT, High ACE level, EBUS 02/26/12  . RLS (restless legs syndrome)   . Vitamin D deficiency     Family History  Problem Relation Age of Onset  . Heart  disease Father   . Heart attack Father   . Hypertension Father   . Alzheimer's disease Father   . Asthma Mother   . Diabetes Mother        and sister, MGM  . Cancer Maternal Grandfather   . Hypertension Paternal Grandmother    Past Surgical History:  Procedure Laterality Date  . CESAREAN SECTION    . ENDOMETRIAL BIOPSY    . FINGER SURGERY     x2  . TONSILLECTOMY    . TUMOR REMOVAL     gastric schwannoma   Social History   Social History Narrative  . Not on file   Immunization History  Administered Date(s) Administered  . Influenza Split 09/03/2011, 10/03/2012  . Influenza, Quadrivalent, Recombinant, Inj, Pf 08/14/2019  . Pneumococcal Polysaccharide-23 01/04/2012  . Zoster Recombinat (Shingrix) 06/20/2018, 03/07/2019     Objective: Vital Signs: There were no vitals taken for this visit.   Physical Exam   Musculoskeletal Exam: ***  CDAI Exam: CDAI Score: -- Patient Global: --; Provider Global: -- Swollen: --; Tender: -- Joint Exam 04/14/2020   No joint exam has been documented for this visit   There is currently no information documented on the homunculus. Go to the Rheumatology activity and complete the homunculus joint exam.  Investigation: No additional findings.  Imaging: No results found.  Recent Labs: Lab Results  Component Value Date   WBC 6.3 06/25/2019   HGB 13.2 06/25/2019   PLT 311 06/25/2019   NA 135 06/25/2019   K 3.9 06/25/2019   CL 98 06/25/2019  CO2 25 06/25/2019   GLUCOSE 281 (H) 06/25/2019   BUN 10 06/25/2019   CREATININE 0.68 06/25/2019   BILITOT 0.6 06/25/2019   ALKPHOS 100 05/31/2017   AST 33 06/25/2019   ALT 44 (H) 06/25/2019   PROT 6.5 06/25/2019   ALBUMIN 4.2 05/31/2017   CALCIUM 9.5 06/25/2019   GFRAA 108 06/25/2019    Speciality Comments: No specialty comments available.  Procedures:  No procedures performed Allergies: Patient has no known allergies.   Assessment / Plan:     Visit Diagnoses: No diagnosis  found.  Orders: No orders of the defined types were placed in this encounter.  No orders of the defined types were placed in this encounter.   Face-to-face time spent with patient was *** minutes. Greater than 50% of time was spent in counseling and coordination of care.  Follow-Up Instructions: No follow-ups on file.   Earnestine Mealing, CMA  Note - This record has been created using Editor, commissioning.  Chart creation errors have been sought, but may not always  have been located. Such creation errors do not reflect on  the standard of medical care.

## 2020-04-14 ENCOUNTER — Ambulatory Visit: Payer: BC Managed Care – PPO | Admitting: Physician Assistant

## 2020-04-28 NOTE — Progress Notes (Deleted)
Office Visit Note  Patient: Alison [name]             Date of Birth: [birth year]           MRN: [id number]             PCP: Aletha Halim., PA-C Referring: Aletha Halim., PA-C Visit Date: 05/12/2020 Occupation: @GUAROCC @  Subjective:  No chief complaint on file.   History of Present Illness: Alison [name] is a 64 y.o. female ***   Activities of Daily Living:  Patient reports morning stiffness for *** {minute/hour:19697}.   Patient {ACTIONS;DENIES/REPORTS:21021675::"Denies"} nocturnal pain.  Difficulty dressing/grooming: {ACTIONS;DENIES/REPORTS:21021675::"Denies"} Difficulty climbing stairs: {ACTIONS;DENIES/REPORTS:21021675::"Denies"} Difficulty getting out of chair: {ACTIONS;DENIES/REPORTS:21021675::"Denies"} Difficulty using hands for taps, buttons, cutlery, and/or writing: {ACTIONS;DENIES/REPORTS:21021675::"Denies"}  No Rheumatology ROS completed.   PMFS History:  Patient Active Problem List   Diagnosis Date Noted  . Hilar lymphadenopathy 03/16/2017  . High risk medication use 03/16/2017  . Age-related osteoporosis without current pathological fracture 03/16/2017  . ANA positive 03/11/2017  . Abnormal serum angiotensin-converting enzyme level 03/11/2017  . Chronic cough 01/22/2013  . Dysphagia 10/16/2012  . Sarcoidosis, lung, stage I 02/04/2012  . At risk for coronary artery disease 04/25/2011  . Endometriosis   . Hypertension   . Hyperlipidemia   . RLS (restless legs syndrome)     Past Medical History:  Diagnosis Date  . Anemia    hx of due to heavy menstral cycles  . Chest pain   . Endometriosis   . GERD (gastroesophageal reflux disease)   . Headache(784.0)    occassional headaches  . Hyperlipidemia   . Hypertension   . Inflammatory arthritis   . Mediastinal adenopathy March 2013   PET HOT, High ACE level, EBUS 02/26/12  . RLS (restless legs syndrome)   . Vitamin D deficiency     Family History  Problem Relation Age of Onset  . Heart  disease Father   . Heart attack Father   . Hypertension Father   . Alzheimer's disease Father   . Asthma Mother   . Diabetes Mother        and sister, MGM  . Cancer Maternal Grandfather   . Hypertension Paternal Grandmother    Past Surgical History:  Procedure Laterality Date  . CESAREAN SECTION    . ENDOMETRIAL BIOPSY    . FINGER SURGERY     x2  . TONSILLECTOMY    . TUMOR REMOVAL     gastric schwannoma   Social History   Social History Narrative  . Not on file   Immunization History  Administered Date(s) Administered  . Influenza Split 09/03/2011, 10/03/2012  . Influenza, Quadrivalent, Recombinant, Inj, Pf 08/14/2019  . Pneumococcal Polysaccharide-23 01/04/2012  . Zoster Recombinat (Shingrix) 06/20/2018, 03/07/2019     Objective: Vital Signs: There were no vitals taken for this visit.   Physical Exam   Musculoskeletal Exam: ***  CDAI Exam: CDAI Score: -- Patient Global: --; Provider Global: -- Swollen: --; Tender: -- Joint Exam 05/12/2020   No joint exam has been documented for this visit   There is currently no information documented on the homunculus. Go to the Rheumatology activity and complete the homunculus joint exam.  Investigation: No additional findings.  Imaging: No results found.  Recent Labs: Lab Results  Component Value Date   WBC 6.3 06/25/2019   HGB 13.2 06/25/2019   PLT 311 06/25/2019   NA 135 06/25/2019   K 3.9 06/25/2019   CL 98 06/25/2019  CO2 25 06/25/2019   GLUCOSE 281 (H) 06/25/2019   BUN 10 06/25/2019   CREATININE 0.68 06/25/2019   BILITOT 0.6 06/25/2019   ALKPHOS 100 05/31/2017   AST 33 06/25/2019   ALT 44 (H) 06/25/2019   PROT 6.5 06/25/2019   ALBUMIN 4.2 05/31/2017   CALCIUM 9.5 06/25/2019   GFRAA 108 06/25/2019    Speciality Comments: No specialty comments available.  Procedures:  No procedures performed Allergies: Patient has no known allergies.   Assessment / Plan:     Visit Diagnoses: No diagnosis  found.  Orders: No orders of the defined types were placed in this encounter.  No orders of the defined types were placed in this encounter.   Face-to-face time spent with patient was *** minutes. Greater than 50% of time was spent in counseling and coordination of care.  Follow-Up Instructions: No follow-ups on file.   Earnestine Mealing, CMA  Note - This record has been created using Editor, commissioning.  Chart creation errors have been sought, but may not always  have been located. Such creation errors do not reflect on  the standard of medical care.

## 2020-05-12 ENCOUNTER — Ambulatory Visit: Payer: BC Managed Care – PPO | Admitting: Rheumatology

## 2020-06-24 NOTE — Progress Notes (Deleted)
Office Visit Note  Patient: Alison [name]             Date of Birth: 1956/03/27           MRN: [id number]             PCP: Aletha Halim., PA-C Referring: Aletha Halim., PA-C Visit Date: 07/08/2020 Occupation: @GUAROCC @  Subjective:  No chief complaint on file.   History of Present Illness: Alison [name] is a 64 y.o. female ***   Activities of Daily Living:  Patient reports morning stiffness for *** {minute/hour:19697}.   Patient {ACTIONS;DENIES/REPORTS:21021675::"Denies"} nocturnal pain.  Difficulty dressing/grooming: {ACTIONS;DENIES/REPORTS:21021675::"Denies"} Difficulty climbing stairs: {ACTIONS;DENIES/REPORTS:21021675::"Denies"} Difficulty getting out of chair: {ACTIONS;DENIES/REPORTS:21021675::"Denies"} Difficulty using hands for taps, buttons, cutlery, and/or writing: {ACTIONS;DENIES/REPORTS:21021675::"Denies"}  No Rheumatology ROS completed.   PMFS History:  Patient Active Problem List   Diagnosis Date Noted  . Hilar lymphadenopathy 03/16/2017  . High risk medication use 03/16/2017  . Age-related osteoporosis without current pathological fracture 03/16/2017  . ANA positive 03/11/2017  . Abnormal serum angiotensin-converting enzyme level 03/11/2017  . Chronic cough 01/22/2013  . Dysphagia 10/16/2012  . Sarcoidosis, lung, stage I 02/04/2012  . At risk for coronary artery disease 04/25/2011  . Endometriosis   . Hypertension   . Hyperlipidemia   . RLS (restless legs syndrome)     Past Medical History:  Diagnosis Date  . Anemia    hx of due to heavy menstral cycles  . Chest pain   . Endometriosis   . GERD (gastroesophageal reflux disease)   . Headache(784.0)    occassional headaches  . Hyperlipidemia   . Hypertension   . Inflammatory arthritis   . Mediastinal adenopathy March 2013   PET HOT, High ACE level, EBUS 02/26/12  . RLS (restless legs syndrome)   . Vitamin D deficiency     Family History  Problem Relation Age of Onset  . Heart  disease Father   . Heart attack Father   . Hypertension Father   . Alzheimer's disease Father   . Asthma Mother   . Diabetes Mother        and sister, MGM  . Cancer Maternal Grandfather   . Hypertension Paternal Grandmother    Past Surgical History:  Procedure Laterality Date  . CESAREAN SECTION    . ENDOMETRIAL BIOPSY    . FINGER SURGERY     x2  . TONSILLECTOMY    . TUMOR REMOVAL     gastric schwannoma   Social History   Social History Narrative  . Not on file   Immunization History  Administered Date(s) Administered  . Influenza Split 09/03/2011, 10/03/2012  . Influenza, Quadrivalent, Recombinant, Inj, Pf 08/14/2019  . Pneumococcal Polysaccharide-23 01/04/2012  . Zoster Recombinat (Shingrix) 06/20/2018, 03/07/2019     Objective: Vital Signs: There were no vitals taken for this visit.   Physical Exam   Musculoskeletal Exam: ***  CDAI Exam: CDAI Score: -- Patient Global: --; Provider Global: -- Swollen: --; Tender: -- Joint Exam 07/08/2020   No joint exam has been documented for this visit   There is currently no information documented on the homunculus. Go to the Rheumatology activity and complete the homunculus joint exam.  Investigation: No additional findings.  Imaging: No results found.  Recent Labs: Lab Results  Component Value Date   WBC 6.3 06/25/2019   HGB 13.2 06/25/2019   PLT 311 06/25/2019   NA 135 06/25/2019   K 3.9 06/25/2019   CL 98 06/25/2019  CO2 25 06/25/2019   GLUCOSE 281 (H) 06/25/2019   BUN 10 06/25/2019   CREATININE 0.68 06/25/2019   BILITOT 0.6 06/25/2019   ALKPHOS 100 05/31/2017   AST 33 06/25/2019   ALT 44 (H) 06/25/2019   PROT 6.5 06/25/2019   ALBUMIN 4.2 05/31/2017   CALCIUM 9.5 06/25/2019   GFRAA 108 06/25/2019    Speciality Comments: No specialty comments available.  Procedures:  No procedures performed Allergies: Patient has no known allergies.   Assessment / Plan:     Visit Diagnoses: No diagnosis  found.  Orders: No orders of the defined types were placed in this encounter.  No orders of the defined types were placed in this encounter.   Face-to-face time spent with patient was *** minutes. Greater than 50% of time was spent in counseling and coordination of care.  Follow-Up Instructions: No follow-ups on file.   Earnestine Mealing, CMA  Note - This record has been created using Editor, commissioning.  Chart creation errors have been sought, but may not always  have been located. Such creation errors do not reflect on  the standard of medical care.

## 2020-07-08 ENCOUNTER — Ambulatory Visit: Payer: BC Managed Care – PPO | Admitting: Rheumatology

## 2020-07-08 DIAGNOSIS — R590 Localized enlarged lymph nodes: Secondary | ICD-10-CM

## 2020-07-08 DIAGNOSIS — Z79899 Other long term (current) drug therapy: Secondary | ICD-10-CM

## 2020-07-08 DIAGNOSIS — R768 Other specified abnormal immunological findings in serum: Secondary | ICD-10-CM

## 2020-07-08 DIAGNOSIS — M8589 Other specified disorders of bone density and structure, multiple sites: Secondary | ICD-10-CM

## 2020-07-08 DIAGNOSIS — Z8679 Personal history of other diseases of the circulatory system: Secondary | ICD-10-CM

## 2020-07-08 DIAGNOSIS — G2581 Restless legs syndrome: Secondary | ICD-10-CM

## 2020-07-08 DIAGNOSIS — Z8639 Personal history of other endocrine, nutritional and metabolic disease: Secondary | ICD-10-CM

## 2020-07-08 DIAGNOSIS — D860 Sarcoidosis of lung: Secondary | ICD-10-CM

## 2020-08-12 NOTE — Progress Notes (Deleted)
Office Visit Note  Patient: Alison [name]             Date of Birth: 03-21-1956           MRN: [id number]             PCP: Aletha Halim., PA-C Referring: Aletha Halim., PA-C Visit Date: 08/26/2020 Occupation: @GUAROCC @  Subjective:  No chief complaint on file.   History of Present Illness: Alison [name] is a 64 y.o. female ***   Activities of Daily Living:  Patient reports morning stiffness for *** {minute/hour:19697}.   Patient {ACTIONS;DENIES/REPORTS:21021675::"Denies"} nocturnal pain.  Difficulty dressing/grooming: {ACTIONS;DENIES/REPORTS:21021675::"Denies"} Difficulty climbing stairs: {ACTIONS;DENIES/REPORTS:21021675::"Denies"} Difficulty getting out of chair: {ACTIONS;DENIES/REPORTS:21021675::"Denies"} Difficulty using hands for taps, buttons, cutlery, and/or writing: {ACTIONS;DENIES/REPORTS:21021675::"Denies"}  No Rheumatology ROS completed.   PMFS History:  Patient Active Problem List   Diagnosis Date Noted  . Hilar lymphadenopathy 03/16/2017  . High risk medication use 03/16/2017  . Age-related osteoporosis without current pathological fracture 03/16/2017  . ANA positive 03/11/2017  . Abnormal serum angiotensin-converting enzyme level 03/11/2017  . Chronic cough 01/22/2013  . Dysphagia 10/16/2012  . Sarcoidosis, lung, stage I 02/04/2012  . At risk for coronary artery disease 04/25/2011  . Endometriosis   . Hypertension   . Hyperlipidemia   . RLS (restless legs syndrome)     Past Medical History:  Diagnosis Date  . Anemia    hx of due to heavy menstral cycles  . Chest pain   . Endometriosis   . GERD (gastroesophageal reflux disease)   . Headache(784.0)    occassional headaches  . Hyperlipidemia   . Hypertension   . Inflammatory arthritis   . Mediastinal adenopathy March 2013   PET HOT, High ACE level, EBUS 02/26/12  . RLS (restless legs syndrome)   . Vitamin D deficiency     Family History  Problem Relation Age of Onset  . Heart  disease Father   . Heart attack Father   . Hypertension Father   . Alzheimer's disease Father   . Asthma Mother   . Diabetes Mother        and sister, MGM  . Cancer Maternal Grandfather   . Hypertension Paternal Grandmother    Past Surgical History:  Procedure Laterality Date  . CESAREAN SECTION    . ENDOMETRIAL BIOPSY    . FINGER SURGERY     x2  . TONSILLECTOMY    . TUMOR REMOVAL     gastric schwannoma   Social History   Social History Narrative  . Not on file   Immunization History  Administered Date(s) Administered  . Influenza Split 09/03/2011, 10/03/2012  . Influenza, Quadrivalent, Recombinant, Inj, Pf 08/14/2019  . Pneumococcal Polysaccharide-23 01/04/2012  . Zoster Recombinat (Shingrix) 06/20/2018, 03/07/2019     Objective: Vital Signs: There were no vitals taken for this visit.   Physical Exam   Musculoskeletal Exam: ***  CDAI Exam: CDAI Score: -- Patient Global: --; Provider Global: -- Swollen: --; Tender: -- Joint Exam 08/26/2020   No joint exam has been documented for this visit   There is currently no information documented on the homunculus. Go to the Rheumatology activity and complete the homunculus joint exam.  Investigation: No additional findings.  Imaging: No results found.  Recent Labs: Lab Results  Component Value Date   WBC 6.3 06/25/2019   HGB 13.2 06/25/2019   PLT 311 06/25/2019   NA 135 06/25/2019   K 3.9 06/25/2019   CL 98 06/25/2019  CO2 25 06/25/2019   GLUCOSE 281 (H) 06/25/2019   BUN 10 06/25/2019   CREATININE 0.68 06/25/2019   BILITOT 0.6 06/25/2019   ALKPHOS 100 05/31/2017   AST 33 06/25/2019   ALT 44 (H) 06/25/2019   PROT 6.5 06/25/2019   ALBUMIN 4.2 05/31/2017   CALCIUM 9.5 06/25/2019   GFRAA 108 06/25/2019    Speciality Comments: No specialty comments available.  Procedures:  No procedures performed Allergies: Patient has no known allergies.   Assessment / Plan:     Visit Diagnoses: Sarcoidosis,  lung, stage I  Hilar lymphadenopathy  High risk medication use  ANA positive  Osteopenia of multiple sites  RLS (restless legs syndrome)  History of hyperlipidemia  History of hypertension  Elevated LFTs  Orders: No orders of the defined types were placed in this encounter.  No orders of the defined types were placed in this encounter.   Face-to-face time spent with patient was *** minutes. Greater than 50% of time was spent in counseling and coordination of care.  Follow-Up Instructions: No follow-ups on file.   Ofilia Neas, PA-C  Note - This record has been created using Dragon software.  Chart creation errors have been sought, but may not always  have been located. Such creation errors do not reflect on  the standard of medical care.

## 2020-08-26 ENCOUNTER — Ambulatory Visit: Payer: BC Managed Care – PPO | Admitting: Rheumatology

## 2020-09-29 NOTE — Progress Notes (Signed)
Office Visit Note  Patient: Alison [name]             Date of Birth: [birth year]           MRN: [id number]             PCP: Aletha Halim., PA-C Referring: Aletha Halim., PA-C Visit Date: 10/13/2020 Occupation: @GUAROCC @  Subjective:  Other (patient reports nauseous feeling for several weeks ) and Other (patient has received 3 doses of the COVID 19 vaccine, she will call back with dates)   History of Present Illness: Alison [name] is a 64 y.o. female history of pulmonary sarcoidosis and inflammatory arthritis.  She has been off methotrexate for almost 2 years now due to elevation in her liver function.  She states she has had no increased shortness of breath.  She has episodic discomfort in her ankles and her hands which last for about 1 week.  She has had 2 episodes in a year.  Continues to have dry mouth and dry eyes.  She has tried pilocarpine in the past without good response.  Activities of Daily Living:  Patient reports morning stiffness for 0 minutes.   Patient Denies nocturnal pain.  Difficulty dressing/grooming: Denies Difficulty climbing stairs: Reports Difficulty getting out of chair: Reports Difficulty using hands for taps, buttons, cutlery, and/or writing: Denies  Review of Systems  Constitutional: Negative for fatigue.  HENT: Positive for mouth dryness and nose dryness. Negative for mouth sores.   Eyes: Positive for dryness. Negative for pain and itching.  Respiratory: Negative for shortness of breath and difficulty breathing.   Cardiovascular: Negative for chest pain and palpitations.  Gastrointestinal: Positive for nausea. Negative for blood in stool, constipation and diarrhea.  Endocrine: Negative for increased urination.  Genitourinary: Negative for difficulty urinating.  Musculoskeletal: Positive for arthralgias and joint pain. Negative for joint swelling, myalgias, morning stiffness, muscle tenderness and myalgias.  Skin: Negative for color change,  rash and redness.  Allergic/Immunologic: Negative for susceptible to infections.  Neurological: Negative for dizziness, numbness, headaches, memory loss and weakness.  Hematological: Negative for bruising/bleeding tendency.  Psychiatric/Behavioral: Negative for confusion.    PMFS History:  Patient Active Problem List   Diagnosis Date Noted  . Hilar lymphadenopathy 03/16/2017  . High risk medication use 03/16/2017  . Age-related osteoporosis without current pathological fracture 03/16/2017  . ANA positive 03/11/2017  . Abnormal serum angiotensin-converting enzyme level 03/11/2017  . Chronic cough 01/22/2013  . Dysphagia 10/16/2012  . Sarcoidosis, lung, stage I 02/04/2012  . At risk for coronary artery disease 04/25/2011  . Endometriosis   . Hypertension   . Hyperlipidemia   . RLS (restless legs syndrome)     Past Medical History:  Diagnosis Date  . Anemia    hx of due to heavy menstral cycles  . Chest pain   . Endometriosis   . GERD (gastroesophageal reflux disease)   . Headache(784.0)    occassional headaches  . Hyperlipidemia   . Hypertension   . Inflammatory arthritis   . Mediastinal adenopathy March 2013   PET HOT, High ACE level, EBUS 02/26/12  . RLS (restless legs syndrome)   . Vitamin D deficiency     Family History  Problem Relation Age of Onset  . Heart disease Father   . Heart attack Father   . Hypertension Father   . Alzheimer's disease Father   . Asthma Mother   . Diabetes Mother        and sister,  MGM  . Cancer Maternal Grandfather   . Hypertension Paternal Grandmother    Past Surgical History:  Procedure Laterality Date  . CESAREAN SECTION    . ENDOMETRIAL BIOPSY    . FINGER SURGERY     x2  . TONSILLECTOMY    . TUMOR REMOVAL     gastric schwannoma   Social History   Social History Narrative  . Not on file   Immunization History  Administered Date(s) Administered  . Influenza Split 09/03/2011, 10/03/2012  . Influenza, Quadrivalent,  Recombinant, Inj, Pf 08/14/2019  . Pneumococcal Polysaccharide-23 01/04/2012  . Zoster Recombinat (Shingrix) 06/20/2018, 03/07/2019     Objective: Vital Signs: BP 122/79 (BP Location: Left Arm, Patient Position: Sitting, Cuff Size: Normal)   Pulse 87   Resp 14   Ht 5' 6.5" (1.689 m)   Wt 165 lb 9.6 oz (75.1 kg)   BMI 26.33 kg/m    Physical Exam Vitals and nursing note reviewed.  Constitutional:      Appearance: She is well-developed.  HENT:     Head: Normocephalic and atraumatic.  Eyes:     Conjunctiva/sclera: Conjunctivae normal.  Cardiovascular:     Rate and Rhythm: Normal rate and regular rhythm.     Heart sounds: Normal heart sounds.  Pulmonary:     Effort: Pulmonary effort is normal.     Breath sounds: Normal breath sounds.  Abdominal:     General: Bowel sounds are normal.     Palpations: Abdomen is soft.  Musculoskeletal:     Cervical back: Normal range of motion.  Lymphadenopathy:     Cervical: No cervical adenopathy.  Skin:    General: Skin is warm and dry.     Capillary Refill: Capillary refill takes less than 2 seconds.  Neurological:     Mental Status: She is alert and oriented to person, place, and time.  Psychiatric:        Behavior: Behavior normal.      Musculoskeletal Exam: C-spine thoracic and lumbar spine with good range of motion.  Shoulder joints, elbow joints, wrist joints, MCPs PIPs and DIPs with good range of motion.  She has mild PIP and DIP thickening with no synovitis.  Hip joints, knee joints, ankles, MTPs and PIPs with good range of motion with no synovitis.  CDAI Exam: CDAI Score: -- Patient Global: --; Provider Global: -- Swollen: --; Tender: -- Joint Exam 10/13/2020   No joint exam has been documented for this visit   There is currently no information documented on the homunculus. Go to the Rheumatology activity and complete the homunculus joint exam.  Investigation: No additional findings.  Imaging: No results  found.  Recent Labs: Lab Results  Component Value Date   WBC 6.3 06/25/2019   HGB 13.2 06/25/2019   PLT 311 06/25/2019   NA 135 06/25/2019   K 3.9 06/25/2019   CL 98 06/25/2019   CO2 25 06/25/2019   GLUCOSE 281 (H) 06/25/2019   BUN 10 06/25/2019   CREATININE 0.68 06/25/2019   BILITOT 0.6 06/25/2019   ALKPHOS 100 05/31/2017   AST 33 06/25/2019   ALT 44 (H) 06/25/2019   PROT 6.5 06/25/2019   ALBUMIN 4.2 05/31/2017   CALCIUM 9.5 06/25/2019   GFRAA 108 06/25/2019    Speciality Comments: No specialty comments available.  Procedures:  No procedures performed Allergies: Patient has no known allergies.   Assessment / Plan:     Visit Diagnoses: Sarcoidosis, lung, stage I - +ACE, hilar lymphadenopathy.  She  does not have any pulmonary symptoms.  Her last chest x-ray 2019 was negative.  Her chest chest was clear to auscultation today.  Hilar lymphadenopathy-there was no hilar lymphadenopathy on her chest x-ray in 2019.  Primary osteoarthritis of both hands-she has history of intermittent discomfort in her hands and ankles.  No synovitis was noted today.  She has mild osteoarthritic changes.  High risk medication use - Discontinued Methotrexate 3 tablets po once weekly and folic acid in February 2020 due to elevated LFTs.  She has not noticed any worsening of her symptoms.  Sjogren's syndrome-she has positive ANA and +Ro.  She continues to have sicca symptoms.  Over-the-counter products were discussed.  She has tried pilocarpine in the past and did not notice any benefit from it.  She needs CBC with differential, CMP and UA for evaluation.  She states she will get labs from her PCP.  Osteopenia of multiple sites - treated with Fosamax (November 2017 to July 2019). Last DEXA on 06/06/2018 showed T score -1.9 right femur neck and positive ^% change in BMD at left femur neck.  She will need a repeat DEXA this year.  RLS (restless legs syndrome)  History of hypertension  History of  hyperlipidemia  She is fully immunized against COVID-19.  Orders: No orders of the defined types were placed in this encounter.  No orders of the defined types were placed in this encounter.    Follow-Up Instructions: Return in about 6 months (around 04/12/2021) for Sarcoidosis.   Bo Merino, MD  Note - This record has been created using Editor, commissioning.  Chart creation errors have been sought, but may not always  have been located. Such creation errors do not reflect on  the standard of medical care.

## 2020-10-13 ENCOUNTER — Encounter: Payer: Self-pay | Admitting: Rheumatology

## 2020-10-13 ENCOUNTER — Other Ambulatory Visit: Payer: Self-pay

## 2020-10-13 ENCOUNTER — Ambulatory Visit (INDEPENDENT_AMBULATORY_CARE_PROVIDER_SITE_OTHER): Payer: Medicare HMO | Admitting: Rheumatology

## 2020-10-13 VITALS — BP 122/79 | HR 87 | Resp 14 | Ht 66.5 in | Wt 165.6 lb

## 2020-10-13 DIAGNOSIS — M3509 Sicca syndrome with other organ involvement: Secondary | ICD-10-CM

## 2020-10-13 DIAGNOSIS — Z79899 Other long term (current) drug therapy: Secondary | ICD-10-CM | POA: Diagnosis not present

## 2020-10-13 DIAGNOSIS — R768 Other specified abnormal immunological findings in serum: Secondary | ICD-10-CM

## 2020-10-13 DIAGNOSIS — R590 Localized enlarged lymph nodes: Secondary | ICD-10-CM | POA: Diagnosis not present

## 2020-10-13 DIAGNOSIS — Z8639 Personal history of other endocrine, nutritional and metabolic disease: Secondary | ICD-10-CM

## 2020-10-13 DIAGNOSIS — Z8679 Personal history of other diseases of the circulatory system: Secondary | ICD-10-CM

## 2020-10-13 DIAGNOSIS — M19041 Primary osteoarthritis, right hand: Secondary | ICD-10-CM | POA: Diagnosis not present

## 2020-10-13 DIAGNOSIS — G2581 Restless legs syndrome: Secondary | ICD-10-CM

## 2020-10-13 DIAGNOSIS — D860 Sarcoidosis of lung: Secondary | ICD-10-CM

## 2020-10-13 DIAGNOSIS — M19042 Primary osteoarthritis, left hand: Secondary | ICD-10-CM

## 2020-10-13 DIAGNOSIS — M8589 Other specified disorders of bone density and structure, multiple sites: Secondary | ICD-10-CM

## 2020-12-08 DIAGNOSIS — I10 Essential (primary) hypertension: Secondary | ICD-10-CM | POA: Diagnosis not present

## 2020-12-08 DIAGNOSIS — K219 Gastro-esophageal reflux disease without esophagitis: Secondary | ICD-10-CM | POA: Diagnosis not present

## 2020-12-08 DIAGNOSIS — R69 Illness, unspecified: Secondary | ICD-10-CM | POA: Diagnosis not present

## 2020-12-08 DIAGNOSIS — G2581 Restless legs syndrome: Secondary | ICD-10-CM | POA: Diagnosis not present

## 2020-12-08 DIAGNOSIS — E7800 Pure hypercholesterolemia, unspecified: Secondary | ICD-10-CM | POA: Diagnosis not present

## 2020-12-08 DIAGNOSIS — E119 Type 2 diabetes mellitus without complications: Secondary | ICD-10-CM | POA: Diagnosis not present

## 2020-12-08 DIAGNOSIS — Z23 Encounter for immunization: Secondary | ICD-10-CM | POA: Diagnosis not present

## 2020-12-28 DIAGNOSIS — R69 Illness, unspecified: Secondary | ICD-10-CM | POA: Diagnosis not present

## 2021-03-30 NOTE — Progress Notes (Signed)
Office Visit Note  Patient: Alison [name]             Date of Birth: Jan 26, 1956           MRN: [id number]             PCP: Aletha Halim., PA-C Referring: Aletha Halim., PA-C Visit Date: 04/13/2021 Occupation: @GUAROCC @  Subjective:  Sicca symptoms   History of Present Illness: Alison [name] is a 65 y.o. female with history of sarcoidosis, Sjogren's syndrome, and osteopenia.  Patient discontinued methotrexate in February 2020 and has not developed any signs or symptoms of a flare.  She denies any new or worsening pulmonary symptoms.  She has not had any shortness of breath.  She denies any recent rashes or EN lesions.  She states her energy level has been stable overall. She continues to have chronic sicca symptoms which are severe at times. She has been using eye drops several times a day and tried several OTC products for mouth dryness with minimal improvement in her symptoms. She sleeps with a fan on in her room and has not tried a humidifier in the bedroom. She sees her ophthalmologist on a yearly basis and her dentist every 6 months.  She denies any recent dental caries.  She denies any parotid swelling or tenderness.  She has not had any salivary stones.  Patient reports that she has occasional discomfort in both ankle joints which has been mild and self resolving.  She has not needed to take any over-the-counter products for pain relief.  She denies any joint swelling.  She states that over the past 2 weeks she has had some intermittent numbness on the back of her right thigh extending to her knee.  She has been unable to identify a trigger for her symptoms.  She is not experiencing any back pain at this time. She denies any recent falls or fractures.  She took Fosamax from 2017 due to 2019 and tolerated it during that time.  She has been taking vitamin D 3000 units daily and a calcium supplement on a daily basis.   Activities of Daily Living:  Patient reports morning stiffness  for 0 minutes.   Patient Denies nocturnal pain.  Difficulty dressing/grooming: Denies Difficulty climbing stairs: Denies Difficulty getting out of chair: Denies Difficulty using hands for taps, buttons, cutlery, and/or writing: Denies  Review of Systems  Constitutional: Negative for fatigue.  HENT: Positive for mouth dryness and nose dryness. Negative for mouth sores.   Eyes: Positive for dryness. Negative for pain, itching and visual disturbance.  Respiratory: Negative for cough, hemoptysis, shortness of breath and difficulty breathing.   Cardiovascular: Negative for chest pain, palpitations and swelling in legs/feet.  Gastrointestinal: Negative for abdominal pain, blood in stool, constipation and diarrhea.  Endocrine: Negative for increased urination.  Genitourinary: Negative for painful urination.  Musculoskeletal: Negative for arthralgias, joint pain, joint swelling, myalgias, muscle weakness, morning stiffness, muscle tenderness and myalgias.  Skin: Negative for color change, rash and redness.  Allergic/Immunologic: Negative for susceptible to infections.  Neurological: Negative for dizziness, numbness, headaches, memory loss and weakness.  Hematological: Negative for swollen glands.  Psychiatric/Behavioral: Negative for confusion and sleep disturbance.    PMFS History:  Patient Active Problem List   Diagnosis Date Noted  . Hilar lymphadenopathy 03/16/2017  . High risk medication use 03/16/2017  . Age-related osteoporosis without current pathological fracture 03/16/2017  . ANA positive 03/11/2017  . Abnormal serum angiotensin-converting enzyme level 03/11/2017  .  Chronic cough 01/22/2013  . Dysphagia 10/16/2012  . Sarcoidosis, lung, stage I 02/04/2012  . At risk for coronary artery disease 04/25/2011  . Endometriosis   . Hypertension   . Hyperlipidemia   . RLS (restless legs syndrome)     Past Medical History:  Diagnosis Date  . Anemia    hx of due to heavy menstral  cycles  . Chest pain   . Endometriosis   . GERD (gastroesophageal reflux disease)   . Headache(784.0)    occassional headaches  . Hyperlipidemia   . Hypertension   . Inflammatory arthritis   . Mediastinal adenopathy March 2013   PET HOT, High ACE level, EBUS 02/26/12  . RLS (restless legs syndrome)   . Vitamin D deficiency     Family History  Problem Relation Age of Onset  . Heart disease Father   . Heart attack Father   . Hypertension Father   . Alzheimer's disease Father   . Asthma Mother   . Diabetes Mother        and sister, MGM  . Cancer Maternal Grandfather   . Hypertension Paternal Grandmother    Past Surgical History:  Procedure Laterality Date  . CESAREAN SECTION    . ENDOMETRIAL BIOPSY    . FINGER SURGERY     x2  . TONSILLECTOMY    . TUMOR REMOVAL     gastric schwannoma   Social History   Social History Narrative  . Not on file   Immunization History  Administered Date(s) Administered  . Influenza Split 09/03/2011, 10/03/2012  . Influenza, Quadrivalent, Recombinant, Inj, Pf 08/14/2019  . Pneumococcal Polysaccharide-23 01/04/2012  . Zoster Recombinat (Shingrix) 06/20/2018, 03/07/2019     Objective: Vital Signs: BP 114/75 (BP Location: Left Arm, Patient Position: Sitting, Cuff Size: Normal)   Pulse 83   Ht 5' 6.5" (1.689 m)   Wt 164 lb 6.4 oz (74.6 kg)   BMI 26.14 kg/m    Physical Exam Vitals and nursing note reviewed.  Constitutional:      Appearance: She is well-developed.  HENT:     Head: Normocephalic and atraumatic.  Eyes:     Conjunctiva/sclera: Conjunctivae normal.  Pulmonary:     Effort: Pulmonary effort is normal.  Abdominal:     Palpations: Abdomen is soft.  Musculoskeletal:     Cervical back: Normal range of motion.  Skin:    General: Skin is warm and dry.     Capillary Refill: Capillary refill takes less than 2 seconds.  Neurological:     Mental Status: She is alert and oriented to person, place, and time.  Psychiatric:         Behavior: Behavior normal.      Musculoskeletal Exam: C-spine, thoracic spine, lumbar spine have good range of motion with no discomfort.  No midline spinal tenderness or SI joint tenderness.  Shoulder joints, elbow joints, wrist joints, MCPs, PIPs, DIPs have good range of motion with no synovitis.  She has PIP and DIP thickening consistent with osteoarthritis of both hands.  She is able to make a complete fist bilaterally.  Hip joints have good range of motion with no discomfort.  Knee joints have good range of motion with no warmth or effusion.  Ankle joints have good range of motion with no tenderness or inflammation.  No tenderness over MTP joints.  No evidence of Achilles tendinitis or plantar fasciitis.  CDAI Exam: CDAI Score: -- Patient Global: --; Provider Global: -- Swollen: --; Tender: -- Joint Exam  04/13/2021   No joint exam has been documented for this visit   There is currently no information documented on the homunculus. Go to the Rheumatology activity and complete the homunculus joint exam.  Investigation: No additional findings.  Imaging: No results found.  Recent Labs: Lab Results  Component Value Date   WBC 6.3 06/25/2019   HGB 13.2 06/25/2019   PLT 311 06/25/2019   NA 135 06/25/2019   K 3.9 06/25/2019   CL 98 06/25/2019   CO2 25 06/25/2019   GLUCOSE 281 (H) 06/25/2019   BUN 10 06/25/2019   CREATININE 0.68 06/25/2019   BILITOT 0.6 06/25/2019   ALKPHOS 100 05/31/2017   AST 33 06/25/2019   ALT 44 (H) 06/25/2019   PROT 6.5 06/25/2019   ALBUMIN 4.2 05/31/2017   CALCIUM 9.5 06/25/2019   GFRAA 108 06/25/2019    Speciality Comments: No specialty comments available.  Procedures:  No procedures performed Allergies: Patient has no known allergies.   Assessment / Plan:     Visit Diagnoses: Sarcoidosis, lung, stage I - +ACE, hilar lymphadenopathy Chest x-ray 2019 was negative: She is not experiencing any signs or symptoms of active disease.  She has not  developed any new or worsening pulmonary symptoms.  She has been off of methotrexate since February 2020 without any recurrence of symptoms.  She is no longer following up with a pulmonologist.  She has not had any eye pain or inflammation recently.  No EN lesions noted.  She was advised to notify us if she develops any new or worsening symptoms.  She will follow-up in the office in 6 months.  Hilar lymphadenopathy - No hilar lymphadenopathy on her chest x-ray in 2019.   High risk medication use - Discontinued Methotrexate 3 tablets po once weekly and folic acid in February 2020 due to elevated LFTs.   Primary osteoarthritis of both hands: She has PIP and DIP thickening consistent with osteoarthritis of both hands.  No joint tenderness or inflammation was noted.  Complete fist formation bilaterally.  Sjogren's syndrome with other organ involvement ([place]) - Positive ANA and +Ro: She continues to have chronic sicca symptoms which have been severe at times.  She has been using eyedrops 2-3 times per day and has tried several over-the-counter products to alleviate mouth dryness with minimal improvement in her symptoms.  She is strongly encouraged to try humidifier in her bedroom as well as eliminating use of a fan at night.  She can also try wearing an eye mask as well as gel lubricating eyedrops at bedtime.  She continues to see her ophthalmologist on a yearly basis as well as her dentist every 6 months.  She has not had any salivary stones.  No parotid swelling or tenderness. She has not taking any immunosuppressive agents at this time. No signs of inflammatory arthritis were noted at this time.  No synovitis was noted. She is not experiencing any shortness of breath or new pulmonary symptoms at this time. She was advised to notify us if she develops any new or worsening symptoms.  Osteopenia of multiple sites - Last DEXA on 06/06/2018: Right femoral neck BMD 0.639 with T score -1.9.  6% change in BMD for  left femoral neck and 11% change in BMD for AP total spine. Reviewed today in the office. She is overdue to update DEXA.  Order was placed today.  She previously tolerated Fosamax from November 2017 to July 2019.  She has been taking vitamin D 3000 units daily  and a calcium supplement as recommended.  She has not had any recent falls or fractures.  Plan: DG BONE DENSITY (DXA)  Other medical conditions are listed as follows:  History of hypertension: BP was 114/75 today in the office.  RLS (restless legs syndrome)  History of hyperlipidemia  Orders: Orders Placed This Encounter  Procedures  . DG BONE DENSITY (DXA)   No orders of the defined types were placed in this encounter.     Follow-Up Instructions: Return in about 6 months (around 10/14/2021) for Sarcoidosis.   Ofilia Neas, PA-C  Note - This record has been created using Dragon software.  Chart creation errors have been sought, but may not always  have been located. Such creation errors do not reflect on  the standard of medical care.

## 2021-04-03 DIAGNOSIS — K219 Gastro-esophageal reflux disease without esophagitis: Secondary | ICD-10-CM | POA: Diagnosis not present

## 2021-04-03 DIAGNOSIS — R12 Heartburn: Secondary | ICD-10-CM | POA: Diagnosis not present

## 2021-04-13 ENCOUNTER — Ambulatory Visit: Payer: Medicare HMO | Admitting: Physician Assistant

## 2021-04-13 ENCOUNTER — Other Ambulatory Visit: Payer: Self-pay

## 2021-04-13 ENCOUNTER — Encounter: Payer: Self-pay | Admitting: Physician Assistant

## 2021-04-13 VITALS — BP 114/75 | HR 83 | Ht 66.5 in | Wt 164.4 lb

## 2021-04-13 DIAGNOSIS — Z79899 Other long term (current) drug therapy: Secondary | ICD-10-CM | POA: Diagnosis not present

## 2021-04-13 DIAGNOSIS — D860 Sarcoidosis of lung: Secondary | ICD-10-CM

## 2021-04-13 DIAGNOSIS — G2581 Restless legs syndrome: Secondary | ICD-10-CM

## 2021-04-13 DIAGNOSIS — R590 Localized enlarged lymph nodes: Secondary | ICD-10-CM | POA: Diagnosis not present

## 2021-04-13 DIAGNOSIS — M3509 Sicca syndrome with other organ involvement: Secondary | ICD-10-CM

## 2021-04-13 DIAGNOSIS — Z8639 Personal history of other endocrine, nutritional and metabolic disease: Secondary | ICD-10-CM

## 2021-04-13 DIAGNOSIS — M19041 Primary osteoarthritis, right hand: Secondary | ICD-10-CM | POA: Diagnosis not present

## 2021-04-13 DIAGNOSIS — M19042 Primary osteoarthritis, left hand: Secondary | ICD-10-CM

## 2021-04-13 DIAGNOSIS — Z8679 Personal history of other diseases of the circulatory system: Secondary | ICD-10-CM | POA: Diagnosis not present

## 2021-04-13 DIAGNOSIS — M8589 Other specified disorders of bone density and structure, multiple sites: Secondary | ICD-10-CM | POA: Diagnosis not present

## 2021-05-04 ENCOUNTER — Encounter: Payer: Self-pay | Admitting: Rheumatology

## 2021-05-04 DIAGNOSIS — M8589 Other specified disorders of bone density and structure, multiple sites: Secondary | ICD-10-CM | POA: Diagnosis not present

## 2021-05-04 DIAGNOSIS — Z780 Asymptomatic menopausal state: Secondary | ICD-10-CM | POA: Diagnosis not present

## 2021-05-04 DIAGNOSIS — Z1231 Encounter for screening mammogram for malignant neoplasm of breast: Secondary | ICD-10-CM | POA: Diagnosis not present

## 2021-05-09 ENCOUNTER — Telehealth: Payer: Self-pay | Admitting: *Deleted

## 2021-05-09 NOTE — Telephone Encounter (Addendum)
Received DEXA results from [facility].  Osteopenia  Date of Scan: 05/04/2021 Lowest T-score and lowest site measured: T-score -2.3, BMD 0.799 lumbar spine Significant changes in BMD and site measured (5% and above): 27% BMD left total femur  Current Regimen: Vitamin D and calcium  Recommendation: Patient remains in osteopenia range. Statistically significant increase in BMD of Bilateral hips. Recommend continuing calcium, Vitamin D and resistive exercises. F/U 2 years. Discussed with patient.  Reviewed by: Hazel Sams, PA-C  Next Appointment: 10/12/2021

## 2021-06-08 DIAGNOSIS — Z0100 Encounter for examination of eyes and vision without abnormal findings: Secondary | ICD-10-CM | POA: Diagnosis not present

## 2021-06-15 DIAGNOSIS — E119 Type 2 diabetes mellitus without complications: Secondary | ICD-10-CM | POA: Diagnosis not present

## 2021-06-15 DIAGNOSIS — K219 Gastro-esophageal reflux disease without esophagitis: Secondary | ICD-10-CM | POA: Diagnosis not present

## 2021-06-15 DIAGNOSIS — G2581 Restless legs syndrome: Secondary | ICD-10-CM | POA: Diagnosis not present

## 2021-06-15 DIAGNOSIS — E7800 Pure hypercholesterolemia, unspecified: Secondary | ICD-10-CM | POA: Diagnosis not present

## 2021-06-15 DIAGNOSIS — Z0000 Encounter for general adult medical examination without abnormal findings: Secondary | ICD-10-CM | POA: Diagnosis not present

## 2021-06-15 DIAGNOSIS — I10 Essential (primary) hypertension: Secondary | ICD-10-CM | POA: Diagnosis not present

## 2021-06-15 DIAGNOSIS — J302 Other seasonal allergic rhinitis: Secondary | ICD-10-CM | POA: Diagnosis not present

## 2021-09-29 NOTE — Progress Notes (Signed)
Office Visit Note  Patient: Alison [name]             Date of Birth: [birth year]           MRN: [id number]             PCP: Aletha Halim., PA-C Referring: Aletha Halim., PA-C Visit Date: 10/12/2021 Occupation: @GUAROCC @  Subjective:  Discussed DEXA results.   History of Present Illness: Alison [name] is a 65 y.o. female with a history of sarcoidosis, Sjogren's and osteopenia.  She denies any increased shortness of breath.  She continues to have sicca symptoms.  She states her sicca symptoms are much worse when she was taking care of her sick mother.  The symptoms have improved.  She has been using over-the-counter products.  She has been off methotrexate for 4 years.  She has been taking calcium and vitamin D.  She has been also exercising.  Activities of Daily Living:  Patient reports morning stiffness for 0 minutes.   Patient Denies nocturnal pain.  Difficulty dressing/grooming: Denies Difficulty climbing stairs: Denies Difficulty getting out of chair: Denies Difficulty using hands for taps, buttons, cutlery, and/or writing: Denies  Review of Systems  Constitutional:  Negative for fatigue.  HENT:  Positive for mouth sores, mouth dryness and nose dryness.   Eyes:  Positive for dryness. Negative for pain and itching.  Respiratory:  Negative for shortness of breath and difficulty breathing.   Cardiovascular:  Negative for chest pain and palpitations.  Gastrointestinal:  Negative for blood in stool, constipation and diarrhea.  Endocrine: Negative for increased urination.  Genitourinary:  Negative for difficulty urinating.  Musculoskeletal:  Negative for joint pain, joint pain, joint swelling, myalgias, morning stiffness, muscle tenderness and myalgias.  Skin:  Negative for color change, rash, redness and sensitivity to sunlight.  Allergic/Immunologic: Negative for susceptible to infections.  Neurological:  Negative for dizziness, numbness, headaches, memory loss and  weakness.  Hematological:  Negative for bruising/bleeding tendency.  Psychiatric/Behavioral:  Negative for confusion.    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   At risk for coronary artery disease 04/25/2011   Endometriosis    Hypertension    Hyperlipidemia    RLS (restless legs syndrome)     Past Medical History:  Diagnosis Date   Anemia    hx of due to heavy menstral cycles   Chest pain    Endometriosis    GERD (gastroesophageal reflux disease)    Headache(784.0)    occassional headaches   Hyperlipidemia    Hypertension    Inflammatory arthritis    Mediastinal adenopathy March 2013   PET HOT, High ACE level, EBUS 02/26/12   RLS (restless legs syndrome)    Vitamin D deficiency     Family History  Problem Relation Age of Onset   Heart disease Father    Heart attack Father    Hypertension Father    Alzheimer's disease Father    Asthma Mother    Diabetes Mother        and sister, MGM   Cancer Maternal Grandfather    Hypertension Paternal Grandmother    Past Surgical History:  Procedure Laterality  Date   CESAREAN SECTION     ENDOMETRIAL BIOPSY     FINGER SURGERY     x2   TONSILLECTOMY     TUMOR REMOVAL     gastric schwannoma   Social History   Social History Narrative   Not on file   Immunization History  Administered Date(s) Administered   Influenza Split 09/03/2011, 10/03/2012   Influenza, Quadrivalent, Recombinant, Inj, Pf 08/14/2019   Pneumococcal Polysaccharide-23 01/04/2012   Zoster Recombinat (Shingrix) 06/20/2018, 03/07/2019     Objective: Vital Signs: BP 140/85 (BP Location: Left Arm,  Patient Position: Sitting, Cuff Size: Normal)   Pulse 76   Ht 5' 6.5" (1.689 m)   Wt 158 lb 9.6 oz (71.9 kg)   BMI 25.22 kg/m    Physical Exam Vitals and nursing note reviewed.  Constitutional:      Appearance: She is well-developed.  HENT:     Head: Normocephalic and atraumatic.  Eyes:     Conjunctiva/sclera: Conjunctivae normal.  Cardiovascular:     Rate and Rhythm: Normal rate and regular rhythm.     Heart sounds: Normal heart sounds.  Pulmonary:     Effort: Pulmonary effort is normal.     Breath sounds: Normal breath sounds.  Abdominal:     General: Bowel sounds are normal.     Palpations: Abdomen is soft.  Musculoskeletal:     Cervical back: Normal range of motion.  Lymphadenopathy:     Cervical: No cervical adenopathy.  Skin:    General: Skin is warm and dry.     Capillary Refill: Capillary refill takes less than 2 seconds.  Neurological:     Mental Status: She is alert and oriented to person, place, and time.  Psychiatric:        Behavior: Behavior normal.     Musculoskeletal Exam: C-spine was in good range of motion.  Shoulder joints, elbow joints, wrist joints, MCPs PIPs and DIPs with good range of motion with no synovitis.  Hip joints, knee joints, ankles, MTPs and PIPs with good range of motion with no synovitis.  CDAI Exam: CDAI Score: -- Patient Global: --; Provider Global: -- Swollen: --; Tender: -- Joint Exam 10/12/2021   No joint exam has been documented for this visit   There is currently no information documented on the homunculus. Go to the Rheumatology activity and complete the homunculus joint exam.  Investigation: No additional findings.  Imaging: No results found.  Recent Labs: Lab Results  Component Value Date   WBC 6.3 06/25/2019   HGB 13.2 06/25/2019   PLT 311 06/25/2019   NA 135 06/25/2019   K 3.9 06/25/2019   CL 98 06/25/2019   CO2 25 06/25/2019   GLUCOSE 281 (H) 06/25/2019   BUN 10 06/25/2019   CREATININE 0.68 06/25/2019    BILITOT 0.6 06/25/2019   ALKPHOS 100 05/31/2017   AST 33 06/25/2019   ALT 44 (H) 06/25/2019   PROT 6.5 06/25/2019   ALBUMIN 4.2 05/31/2017   CALCIUM 9.5 06/25/2019   GFRAA 108 06/25/2019      Speciality Comments: No specialty comments available.  Procedures:  No procedures performed Allergies: Patient has no known allergies.   Assessment / Plan:     Visit Diagnoses: Sarcoidosis, lung, stage I - +ACE, hilar lymphadenopathy Chest x-ray 2019 was negative:  -She is asymptomatic.  She denies any shortness of breath.  Her last chest x-ray was in 2016.  Plan: DG Chest 2 View  Hilar lymphadenopathy - No hilar lymphadenopathy  on her chest x-ray in 2019.   High risk medication use - Discontinued Methotrexate 3 tablets po once weekly and folic acid in February 2020 due to elevated LFTs.   Primary osteoarthritis of both hands-DIP and PIP thickening was noted.  No synovitis was noted.  Joint protection was discussed.  Sjogren's syndrome with other organ involvement ([place]) - Positive ANA and +Ro:  -She was experiencing increased sicca symptoms while she was under stress.  The symptoms have improved.  She had been using over-the-counter products.  We will check labs today.  She is planning to get CBC with differential and CMP with GFR in January with her PCP.  Plan: Urinalysis, Routine w reflex microscopic, Sedimentation rate, Rheumatoid factor, ANA, Sjogrens syndrome-A extractable nuclear antibody, Anti-DNA antibody, double-stranded, Serum protein electrophoresis with reflex  Osteopenia of multiple sites - DEXA on 06/06/2018: Right femoral neck BMD 0.639 with T score -1.9.  She previously tolerated Fosamax from November 2017 to July 2019.May 04, 2021 AP spine T score -2.3, BMD 0.799% change -4%.  The BMD in her bilateral femoral region has improved.  DEXA findings were discussed with the patient declined.  At this point she wants to hold off Fosamax.  Use of calcium with vitamin D was discussed.   Resistive exercises were discussed.  Vitamin D deficiency -she has been taking vitamin D 2000 units daily.  I will check vitamin D level today.  Plan: VITAMIN D 25 Hydroxy (Vit-D Deficiency, Fractures)  History of hyperlipidemia  History of hypertension  RLS (restless legs syndrome)  Orders: Orders Placed This Encounter  Procedures   DG Chest 2 View   VITAMIN D 25 Hydroxy (Vit-D Deficiency, Fractures)   Urinalysis, Routine w reflex microscopic   Sedimentation rate   Rheumatoid factor   ANA   Sjogrens syndrome-A extractable nuclear antibody   Anti-DNA antibody, double-stranded   Serum protein electrophoresis with reflex    No orders of the defined types were placed in this encounter.    Follow-Up Instructions: Return in about 6 months (around 04/11/2022) for Sjogren's, sarcoidosis, [place].   Bo Merino, MD  Note - This record has been created using Editor, commissioning.  Chart creation errors have been sought, but may not always  have been located. Such creation errors do not reflect on  the standard of medical care.

## 2021-10-12 ENCOUNTER — Encounter: Payer: Self-pay | Admitting: Rheumatology

## 2021-10-12 ENCOUNTER — Ambulatory Visit: Payer: Medicare HMO | Admitting: Rheumatology

## 2021-10-12 ENCOUNTER — Other Ambulatory Visit: Payer: Self-pay

## 2021-10-12 VITALS — BP 140/85 | HR 76 | Ht 66.5 in | Wt 158.6 lb

## 2021-10-12 DIAGNOSIS — Z8679 Personal history of other diseases of the circulatory system: Secondary | ICD-10-CM

## 2021-10-12 DIAGNOSIS — Z79899 Other long term (current) drug therapy: Secondary | ICD-10-CM

## 2021-10-12 DIAGNOSIS — R590 Localized enlarged lymph nodes: Secondary | ICD-10-CM | POA: Diagnosis not present

## 2021-10-12 DIAGNOSIS — E559 Vitamin D deficiency, unspecified: Secondary | ICD-10-CM

## 2021-10-12 DIAGNOSIS — G2581 Restless legs syndrome: Secondary | ICD-10-CM

## 2021-10-12 DIAGNOSIS — D860 Sarcoidosis of lung: Secondary | ICD-10-CM | POA: Diagnosis not present

## 2021-10-12 DIAGNOSIS — M19041 Primary osteoarthritis, right hand: Secondary | ICD-10-CM | POA: Diagnosis not present

## 2021-10-12 DIAGNOSIS — M19042 Primary osteoarthritis, left hand: Secondary | ICD-10-CM

## 2021-10-12 DIAGNOSIS — Z8639 Personal history of other endocrine, nutritional and metabolic disease: Secondary | ICD-10-CM

## 2021-10-12 DIAGNOSIS — M3509 Sicca syndrome with other organ involvement: Secondary | ICD-10-CM

## 2021-10-12 DIAGNOSIS — M8589 Other specified disorders of bone density and structure, multiple sites: Secondary | ICD-10-CM

## 2021-10-13 ENCOUNTER — Ambulatory Visit (HOSPITAL_COMMUNITY)
Admission: RE | Admit: 2021-10-13 | Discharge: 2021-10-13 | Disposition: A | Discharge status: Home / Self Care | Payer: Medicare HMO | Source: Ambulatory Visit | Attending: Rheumatology | Admitting: Rheumatology

## 2021-10-13 DIAGNOSIS — D860 Sarcoidosis of lung: Secondary | ICD-10-CM | POA: Diagnosis not present

## 2021-10-13 DIAGNOSIS — D869 Sarcoidosis, unspecified: Secondary | ICD-10-CM | POA: Diagnosis not present

## 2021-10-15 NOTE — Progress Notes (Signed)
CXR is normal

## 2021-10-17 LAB — PROTEIN ELECTROPHORESIS, SERUM, WITH REFLEX
Albumin ELP: 4 g/dL (ref 3.8–4.8)
Alpha 1: 0.4 g/dL — ABNORMAL HIGH (ref 0.2–0.3)
Alpha 2: 0.9 g/dL (ref 0.5–0.9)
Beta 2: 0.4 g/dL (ref 0.2–0.5)
Beta Globulin: 0.6 g/dL (ref 0.4–0.6)
Gamma Globulin: 0.9 g/dL (ref 0.8–1.7)
Total Protein: 7.2 g/dL (ref 6.1–8.1)

## 2021-10-17 LAB — URINALYSIS, ROUTINE W REFLEX MICROSCOPIC
Bilirubin Urine: NEGATIVE
Glucose, UA: NEGATIVE
Hgb urine dipstick: NEGATIVE
Ketones, ur: NEGATIVE
Leukocytes,Ua: NEGATIVE
Nitrite: NEGATIVE
Protein, ur: NEGATIVE
Specific Gravity, Urine: 1.008 (ref 1.001–1.035)
pH: 6.5 (ref 5.0–8.0)

## 2021-10-17 LAB — ANA: Anti Nuclear Antibody (ANA): POSITIVE — AB

## 2021-10-17 LAB — ANTI-NUCLEAR AB-TITER (ANA TITER): ANA Titer 1: 1:320 {titer} — ABNORMAL HIGH

## 2021-10-17 LAB — SJOGRENS SYNDROME-A EXTRACTABLE NUCLEAR ANTIBODY: SSA (Ro) (ENA) Antibody, IgG: <1 AI

## 2021-10-17 LAB — SEDIMENTATION RATE: Sed Rate: 28 mm/h (ref 0–30)

## 2021-10-17 LAB — VITAMIN D 25 HYDROXY (VIT D DEFICIENCY, FRACTURES): Vit D, 25-Hydroxy: 46 ng/mL (ref 30–100)

## 2021-10-17 LAB — ANTI-DNA ANTIBODY, DOUBLE-STRANDED: ds DNA Ab: 1 IU/mL

## 2021-10-17 LAB — RHEUMATOID FACTOR: Rheumatoid fact SerPl-aCnc: <14 IU/mL (ref <14)

## 2021-10-17 NOTE — Progress Notes (Signed)
Vitamin D is normal, UA negative, sed rate normal, RF negative, ANA positive, double-stranded DNA and SSA antibody negative SPEP normal.  No change in treatment advised.

## 2021-12-28 DIAGNOSIS — E119 Type 2 diabetes mellitus without complications: Secondary | ICD-10-CM | POA: Diagnosis not present

## 2021-12-28 DIAGNOSIS — H2513 Age-related nuclear cataract, bilateral: Secondary | ICD-10-CM | POA: Diagnosis not present

## 2021-12-28 DIAGNOSIS — H524 Presbyopia: Secondary | ICD-10-CM | POA: Diagnosis not present

## 2022-01-01 DIAGNOSIS — R69 Illness, unspecified: Secondary | ICD-10-CM | POA: Diagnosis not present

## 2022-01-01 DIAGNOSIS — K219 Gastro-esophageal reflux disease without esophagitis: Secondary | ICD-10-CM | POA: Diagnosis not present

## 2022-01-01 DIAGNOSIS — E119 Type 2 diabetes mellitus without complications: Secondary | ICD-10-CM | POA: Diagnosis not present

## 2022-01-01 DIAGNOSIS — D869 Sarcoidosis, unspecified: Secondary | ICD-10-CM | POA: Diagnosis not present

## 2022-01-01 DIAGNOSIS — E7800 Pure hypercholesterolemia, unspecified: Secondary | ICD-10-CM | POA: Diagnosis not present

## 2022-01-01 DIAGNOSIS — I10 Essential (primary) hypertension: Secondary | ICD-10-CM | POA: Diagnosis not present

## 2022-01-01 DIAGNOSIS — G2581 Restless legs syndrome: Secondary | ICD-10-CM | POA: Diagnosis not present

## 2022-01-29 DIAGNOSIS — Z23 Encounter for immunization: Secondary | ICD-10-CM | POA: Diagnosis not present

## 2022-01-29 DIAGNOSIS — L578 Other skin changes due to chronic exposure to nonionizing radiation: Secondary | ICD-10-CM | POA: Diagnosis not present

## 2022-01-29 DIAGNOSIS — D1801 Hemangioma of skin and subcutaneous tissue: Secondary | ICD-10-CM | POA: Diagnosis not present

## 2022-01-29 DIAGNOSIS — L821 Other seborrheic keratosis: Secondary | ICD-10-CM | POA: Diagnosis not present

## 2022-01-29 DIAGNOSIS — L820 Inflamed seborrheic keratosis: Secondary | ICD-10-CM | POA: Diagnosis not present

## 2022-01-29 DIAGNOSIS — D239 Other benign neoplasm of skin, unspecified: Secondary | ICD-10-CM | POA: Diagnosis not present

## 2022-01-29 DIAGNOSIS — D229 Melanocytic nevi, unspecified: Secondary | ICD-10-CM | POA: Diagnosis not present

## 2022-01-29 DIAGNOSIS — L814 Other melanin hyperpigmentation: Secondary | ICD-10-CM | POA: Diagnosis not present

## 2022-04-05 NOTE — Progress Notes (Signed)
Office Visit Note  Patient: Alison [name]             Date of Birth: 1956-04-25           MRN: [id number]             PCP: Aletha Halim., PA-C Referring: Aletha Halim., PA-C Visit Date: 04/19/2022 Occupation: _0 @  Subjective:  Dry mouth   History of Present Illness: Alison [name] is a 66 y.o. female with history of Sjogren's syndrome, sarcoidosis, and osteoarthritis.  Patient presents today with increased sicca symptoms especially dry mouth.  She states that she has started playing the flute again and has had more difficulty due to the severity of mouth dryness.  She has tried using over-the-counter products including Biotene but did not notice any improvement.  She has been following up closely with her dentist every 6 months as advised.  She denies any recent dental caries.  She denies any parotid swelling or tenderness.  She has not had any swollen lymph nodes.  She states that at one time she tried taking pilocarpine at bedtime but discontinued.  She would like to retry pilocarpine. She states she continues to have eye dryness and has been using artificial tears.  She follows up with her ophthalmologist as recommended.  She denies any signs or symptoms of inflammatory arthritis.  She is not experiencing any joint swelling at this time.  She is occasional pain and aching in her hands due to underlying osteoarthritis.  She denies any new or worsening pulmonary symptoms.  She denies any signs or symptoms of a sarcoidosis flare.  She has not had any recent rashes. She continues to take a calcium and vitamin D supplement daily.  She has not had any recent falls or fractures.   Activities of Daily Living:  Patient reports morning stiffness for 0 minutes.   Patient Denies nocturnal pain.  Difficulty dressing/grooming: Denies Difficulty climbing stairs: Denies Difficulty getting out of chair: Denies Difficulty using hands for taps, buttons, cutlery, and/or writing:  Denies  Review of Systems  Constitutional:  Positive for fatigue.  HENT:  Positive for mouth dryness and nose dryness. Negative for mouth sores.   Eyes:  Positive for dryness. Negative for pain and itching.  Respiratory:  Negative for shortness of breath and difficulty breathing.   Cardiovascular:  Negative for chest pain and palpitations.  Gastrointestinal:  Negative for blood in stool, constipation and diarrhea.  Endocrine: Negative for increased urination.  Genitourinary:  Negative for difficulty urinating.  Musculoskeletal:  Positive for joint pain, joint pain and joint swelling. Negative for myalgias, morning stiffness, muscle tenderness and myalgias.  Skin:  Negative for color change, rash and redness.  Allergic/Immunologic: Negative for susceptible to infections.  Neurological:  Negative for dizziness, numbness, headaches, memory loss and weakness.  Hematological:  Positive for bruising/bleeding tendency.  Psychiatric/Behavioral:  Negative for confusion.    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   At risk for coronary artery disease 04/25/2011   Endometriosis    Hypertension    Hyperlipidemia    RLS (restless legs syndrome)     Past Medical History:  Diagnosis Date  Anemia    hx of due to heavy menstral cycles   Chest pain    Endometriosis    GERD (gastroesophageal reflux disease)    Headache(784.0)    occassional headaches   Hyperlipidemia    Hypertension    Inflammatory arthritis    Mediastinal adenopathy March 2013   PET HOT, High ACE level, EBUS 02/26/12   RLS (restless legs syndrome)    Vitamin D  deficiency     Family History  Problem Relation Age of Onset   Heart disease Father    Heart attack Father    Hypertension Father    Alzheimer's disease Father    Asthma Mother    Diabetes Mother        and sister, MGM   Cancer Maternal Grandfather    Hypertension Paternal Grandmother    Past Surgical History:  Procedure Laterality Date   CESAREAN SECTION     ENDOMETRIAL BIOPSY     FINGER SURGERY     x2   TONSILLECTOMY     TUMOR REMOVAL     gastric schwannoma   Social History   Social History Narrative   Not on file   Immunization History  Administered Date(s) Administered   Influenza Split 09/03/2011, 10/03/2012   Influenza, Quadrivalent, Recombinant, Inj, Pf 08/14/2019   Pneumococcal Polysaccharide-23 01/04/2012   Zoster Recombinat (Shingrix) 06/20/2018, 03/07/2019     Objective: Vital Signs: BP 124/81 (BP Location: Left Arm, Patient Position: Sitting, Cuff Size: Normal)   Pulse 81   Ht 5' 6.5" (1.689 m)   Wt 162 lb (73.5 kg)   BMI 25.76 kg/m    Physical Exam Vitals and nursing note reviewed.  Constitutional:      Appearance: She is well-developed.  HENT:     Head: Normocephalic and atraumatic.  Eyes:     Conjunctiva/sclera: Conjunctivae normal.  Cardiovascular:     Rate and Rhythm: Normal rate and regular rhythm.     Heart sounds: Normal heart sounds.  Pulmonary:     Effort: Pulmonary effort is normal.     Breath sounds: Normal breath sounds.  Abdominal:     General: Bowel sounds are normal.     Palpations: Abdomen is soft.  Musculoskeletal:     Cervical back: Normal range of motion.  Skin:    General: Skin is warm and dry.     Capillary Refill: Capillary refill takes less than 2 seconds.  Neurological:     Mental Status: She is alert and oriented to person, place, and time.  Psychiatric:        Behavior: Behavior normal.     Musculoskeletal Exam: C-spine, thoracic spine, lumbar spine have good range of motion.  Shoulder joints, elbow  joints, wrist joints, MCPs, PIPs, DIPs have good range of motion with no synovitis.  Complete fist formation bilaterally.  PIP and DIP thickening consistent with osteoarthritis of both hands.  Hip joints have good range of motion with no groin pain.  Knee joints have good range of motion with no warmth or effusion.  Ankle joints have good range of motion with no tenderness or joint swelling.  No tenderness over MTP joints.  No evidence of Achilles tendinitis or planter fasciitis.  CDAI Exam: CDAI Score: -- Patient Global: --; Provider Global: -- Swollen: --; Tender: -- Joint Exam 04/19/2022   No joint exam has been documented for this visit   There is currently no information documented on the homunculus. Go to the Rheumatology activity and complete the homunculus   joint exam.  Investigation: No additional findings.  Imaging: No results found.  Recent Labs: Lab Results  Component Value Date   WBC 6.3 06/25/2019   HGB 13.2 06/25/2019   PLT 311 06/25/2019   NA 135 06/25/2019   K 3.9 06/25/2019   CL 98 06/25/2019   CO2 25 06/25/2019   GLUCOSE 281 (H) 06/25/2019   BUN 10 06/25/2019   CREATININE 0.68 06/25/2019   BILITOT 0.6 06/25/2019   ALKPHOS 100 05/31/2017   AST 33 06/25/2019   ALT 44 (H) 06/25/2019   PROT 7.2 10/12/2021   ALBUMIN 4.2 05/31/2017   CALCIUM 9.5 06/25/2019   GFRAA 108 06/25/2019    Speciality Comments: No specialty comments available.  Procedures:  No procedures performed Allergies: Patient has no known allergies.   Assessment / Plan:     Visit Diagnoses: Sarcoidosis, lung, stage I - +ACE, hilar lymphadenopathy Chest x-ray 2019 was negative: No recurrence.  Chest x-ray did not reveal any active cardiopulmonary disease on 10/13/2021.  She has not noticed any new or worsening pulmonary symptoms.  Hilar lymphadenopathy - No hilar lymphadenopathy on her chest x-ray in 2019.  Chest x-ray on 10/14/2021 was negative.  High risk medication use - Discontinued  Methotrexate 3 tablets po once weekly and folic acid in February 2020 due to elevated LFTs.  She does not require immunosuppressive therapy at this time.- Plan: CBC with Differential/Platelet, COMPLETE METABOLIC PANEL WITH GFR  Primary osteoarthritis of both hands: She has PIP and DIP thickening consistent with osteoarthritis of both hands.  No signs of inflammatory arthritis. No synovitis was noted.  Discussed the importance of joint protection and muscle strengthening.   Sjogren's syndrome with other organ involvement (HCC) - Positive ANA and +Ro: She presents today with increased sicca symptoms especially dry mouth.  She has had increased daytime symptoms and has had difficulty playing the flute due to the severity of mouth dryness.  She has tried Biotene products with minimal relief.  She previously tried pilocarpine at bedtime but discontinued since she was not having severe symptoms at that time.  She would like to retry pilocarpine.  A prescription for pilocarpine 5 mg 1 tablet 3 times daily as needed was sent to the pharmacy today.  Discussed that she can continue to use over-the-counter products for breakthrough symptoms if needed.  Discussed the importance of biannual dental examinations.  I discussed the importance of proper oral hygiene to prevent dental caries.   She has no parotid swelling or tenderness.  No cervical lymphadenopathy.  Discussed the fourfold to 14 fold increased risk for lymphoma in patients with Sjogren's syndrome.  SPEP did not reveal any abnormal protein bands, RF negative, and ESR within normal limits on 10/12/2021. She is not experiencing any new or worsening pulmonary symptoms.  Her lungs were clear to auscultation on examination today.  Discussed the increased risk for developing ILD in patients with Sjogren's syndrome.  Chest x-ray did not reveal any active cardiopulmonary disease on 10/13/2021.  She was advised to notify us if she develops shortness of breath, cough, or  breathlessness. She is not currently experiencing any symptoms of neuropathy.  I discussed the increased risk for neuropathy in patients with Sokun syndrome.  She was advised to notify us if she develops any new or worsening symptoms. She has no signs of inflammatory arthritis at this time.  No synovitis was noted.  She has some aching and stiffness in her hands due to underlying osteoarthritis.  She does not require   immunosuppressive therapy at this time. Lab work from 10/12/2021 was reviewed today in the office: ANA 1: 320 NS, Ro antibody negative, double-stranded DNA antibody negative, RF negative, ESR within normal limits, SPEP did not reveal any abnormal protein bands. CBC, CMP, and UA will be checked today.  At her follow-up visit in 6 months we will recheck autoimmune lab work including ANA, RF, Ro antibody, ESR, complements, CBC, CMP, UA, and SPEP.- Plan: CBC with Differential/Platelet, COMPLETE METABOLIC PANEL WITH GFR, Urinalysis, Routine w reflex microscopic  Osteopenia of multiple sites - Previously tolerated Fosamax from November 2017 to July 2019.  DEXA 05/04/2021: AP spine T score -2.3, BMD 0.799% change -4%.  She is taking a calcium and vitamin D supplement daily as recommended.  No recent falls or fractures.  Due to update DEXA in June 2024.  Vitamin D deficiency: She is taking vitamin D 1000 units daily.  Other medical conditions are listed as follows:  History of hyperlipidemia  History of hypertension: Blood pressure was 124/81 today in the office.  RLS (restless legs syndrome): She remains on Requip as prescribed.  Orders: Orders Placed This Encounter  Procedures   CBC with Differential/Platelet   COMPLETE METABOLIC PANEL WITH GFR   Urinalysis, Routine w reflex microscopic   Meds ordered this encounter  Medications   pilocarpine (SALAGEN) 5 MG tablet    Sig: Take 1 tablet (5 mg total) by mouth 3 (three) times daily as needed.    Dispense:  90 tablet    Refill:  2      Follow-Up Instructions: Return in about 5 months (around 09/19/2022) for Sjogren's syndrome, Sarcoidosis, Osteoarthritis.   Ofilia Neas, PA-C  Note - This record has been created using Dragon software.  Chart creation errors have been sought, but may not always  have been located. Such creation errors do not reflect on  the standard of medical care.

## 2022-04-19 ENCOUNTER — Encounter: Payer: Self-pay | Admitting: Physician Assistant

## 2022-04-19 ENCOUNTER — Ambulatory Visit: Payer: Medicare HMO | Admitting: Physician Assistant

## 2022-04-19 VITALS — BP 124/81 | HR 81 | Ht 66.5 in | Wt 162.0 lb

## 2022-04-19 DIAGNOSIS — E559 Vitamin D deficiency, unspecified: Secondary | ICD-10-CM

## 2022-04-19 DIAGNOSIS — M19042 Primary osteoarthritis, left hand: Secondary | ICD-10-CM

## 2022-04-19 DIAGNOSIS — Z8679 Personal history of other diseases of the circulatory system: Secondary | ICD-10-CM

## 2022-04-19 DIAGNOSIS — M8589 Other specified disorders of bone density and structure, multiple sites: Secondary | ICD-10-CM | POA: Diagnosis not present

## 2022-04-19 DIAGNOSIS — M3509 Sicca syndrome with other organ involvement: Secondary | ICD-10-CM

## 2022-04-19 DIAGNOSIS — D860 Sarcoidosis of lung: Secondary | ICD-10-CM

## 2022-04-19 DIAGNOSIS — R590 Localized enlarged lymph nodes: Secondary | ICD-10-CM

## 2022-04-19 DIAGNOSIS — Z79899 Other long term (current) drug therapy: Secondary | ICD-10-CM | POA: Diagnosis not present

## 2022-04-19 DIAGNOSIS — Z8639 Personal history of other endocrine, nutritional and metabolic disease: Secondary | ICD-10-CM

## 2022-04-19 DIAGNOSIS — G2581 Restless legs syndrome: Secondary | ICD-10-CM | POA: Diagnosis not present

## 2022-04-19 DIAGNOSIS — M19041 Primary osteoarthritis, right hand: Secondary | ICD-10-CM

## 2022-04-19 MED ORDER — PILOCARPINE HCL 5 MG PO TABS: 5.0000 mg | ORAL_TABLET | Freq: Three times a day (TID) | ORAL | 2 refills | Status: DC | PRN

## 2022-04-20 LAB — COMPLETE METABOLIC PANEL WITH GFR
AG Ratio: 1.7 (calc) (ref 1.0–2.5)
ALT: 31 U/L — ABNORMAL HIGH (ref 6–29)
AST: 27 U/L (ref 10–35)
Albumin: 4.3 g/dL (ref 3.6–5.1)
Alkaline phosphatase (APISO): 102 U/L (ref 37–153)
BUN: 11 mg/dL (ref 7–25)
CO2: 29 mmol/L (ref 20–32)
Calcium: 9.8 mg/dL (ref 8.6–10.4)
Chloride: 97 mmol/L — ABNORMAL LOW (ref 98–110)
Creat: 0.65 mg/dL (ref 0.50–1.05)
Globulin: 2.5 g/dL (calc) (ref 1.9–3.7)
Glucose, Bld: 117 mg/dL — ABNORMAL HIGH (ref 65–99)
Potassium: 4.8 mmol/L (ref 3.5–5.3)
Sodium: 136 mmol/L (ref 135–146)
Total Bilirubin: 0.7 mg/dL (ref 0.2–1.2)
Total Protein: 6.8 g/dL (ref 6.1–8.1)
eGFR: 97 mL/min/{1.73_m2} (ref >=60)

## 2022-04-20 LAB — CBC WITH DIFFERENTIAL/PLATELET
Absolute Monocytes: 697 cells/uL (ref 200–950)
Basophils Absolute: 34 cells/uL (ref 0–200)
Basophils Relative: 0.4 %
Eosinophils Absolute: 323 cells/uL (ref 15–500)
Eosinophils Relative: 3.8 %
HCT: 36.2 % (ref 35.0–45.0)
Hemoglobin: 11.6 g/dL — ABNORMAL LOW (ref 11.7–15.5)
Lymphs Abs: 1972 cells/uL (ref 850–3900)
MCH: 26.1 pg — ABNORMAL LOW (ref 27.0–33.0)
MCHC: 32 g/dL (ref 32.0–36.0)
MCV: 81.3 fL (ref 80.0–100.0)
MPV: 11.2 fL (ref 7.5–12.5)
Monocytes Relative: 8.2 %
Neutro Abs: 5474 cells/uL (ref 1500–7800)
Neutrophils Relative %: 64.4 %
Platelets: 365 10*3/uL (ref 140–400)
RBC: 4.45 10*6/uL (ref 3.80–5.10)
RDW: 13.4 % (ref 11.0–15.0)
Total Lymphocyte: 23.2 %
WBC: 8.5 10*3/uL (ref 3.8–10.8)

## 2022-04-20 LAB — URINALYSIS, ROUTINE W REFLEX MICROSCOPIC
Bilirubin Urine: NEGATIVE
Glucose, UA: NEGATIVE
Hgb urine dipstick: NEGATIVE
Ketones, ur: NEGATIVE
Leukocytes,Ua: NEGATIVE
Nitrite: NEGATIVE
Protein, ur: NEGATIVE
Specific Gravity, Urine: 1.02 (ref 1.001–1.035)
pH: 7.5 (ref 5.0–8.0)

## 2022-04-20 NOTE — Progress Notes (Signed)
Hemoglobin is low at 11.6.  Glucose is mildly elevated probably not a fasting sample.  Liver functions are mildly elevated.  Patient should avoid use of NSAIDs.  Please forward results to her PCP

## 2022-04-24 DIAGNOSIS — Z0100 Encounter for examination of eyes and vision without abnormal findings: Secondary | ICD-10-CM | POA: Diagnosis not present

## 2022-04-26 DIAGNOSIS — Z0100 Encounter for examination of eyes and vision without abnormal findings: Secondary | ICD-10-CM | POA: Diagnosis not present

## 2022-05-07 DIAGNOSIS — Z1231 Encounter for screening mammogram for malignant neoplasm of breast: Secondary | ICD-10-CM | POA: Diagnosis not present

## 2022-05-17 ENCOUNTER — Other Ambulatory Visit: Payer: Self-pay | Admitting: Physician Assistant

## 2022-05-17 ENCOUNTER — Encounter: Payer: Self-pay | Admitting: *Deleted

## 2022-05-28 ENCOUNTER — Emergency Department (HOSPITAL_BASED_OUTPATIENT_CLINIC_OR_DEPARTMENT_OTHER)
Admission: EM | Admit: 2022-05-28 | Discharge: 2022-05-28 | Disposition: A | Discharge status: Home / Self Care | Payer: Medicare HMO | Attending: Emergency Medicine | Admitting: Emergency Medicine

## 2022-05-28 ENCOUNTER — Emergency Department (HOSPITAL_BASED_OUTPATIENT_CLINIC_OR_DEPARTMENT_OTHER): Payer: Medicare HMO | Admitting: Radiology

## 2022-05-28 ENCOUNTER — Encounter (HOSPITAL_BASED_OUTPATIENT_CLINIC_OR_DEPARTMENT_OTHER): Payer: Self-pay | Admitting: *Deleted

## 2022-05-28 ENCOUNTER — Other Ambulatory Visit: Payer: Self-pay

## 2022-05-28 DIAGNOSIS — X501XXA Overexertion from prolonged static or awkward postures, initial encounter: Secondary | ICD-10-CM | POA: Diagnosis not present

## 2022-05-28 DIAGNOSIS — I10 Essential (primary) hypertension: Secondary | ICD-10-CM | POA: Insufficient documentation

## 2022-05-28 DIAGNOSIS — S92352A Displaced fracture of fifth metatarsal bone, left foot, initial encounter for closed fracture: Secondary | ICD-10-CM | POA: Diagnosis not present

## 2022-05-28 DIAGNOSIS — E119 Type 2 diabetes mellitus without complications: Secondary | ICD-10-CM | POA: Insufficient documentation

## 2022-05-28 DIAGNOSIS — S92341A Displaced fracture of fourth metatarsal bone, right foot, initial encounter for closed fracture: Secondary | ICD-10-CM | POA: Diagnosis not present

## 2022-05-28 DIAGNOSIS — S92351A Displaced fracture of fifth metatarsal bone, right foot, initial encounter for closed fracture: Secondary | ICD-10-CM | POA: Diagnosis not present

## 2022-05-28 DIAGNOSIS — S99921A Unspecified injury of right foot, initial encounter: Secondary | ICD-10-CM | POA: Diagnosis not present

## 2022-05-28 NOTE — ED Provider Notes (Signed)
[facility] EMERGENCY DEPT Provider Note   CSN: [id number] Arrival date & time: 05/28/22  1957     History  Chief Complaint  Patient presents with   Foot Injury    Alison WADDEL is a 66 y.o. female.  Patient is a 66 year old female with history of GERD, diabetes, and hypertension.  Patient presenting with complaints of a right foot injury.  She reports turning her foot 2 days ago on a step.  She has had increased swelling since.  Pain worse with ambulation and weightbearing.  There are no alleviating factors.  The history is provided by the patient.  Foot Injury Location:  Foot Time since incident:  2 days Injury: yes   Foot location:  R foot Dislocation: no   Relieved by:  Nothing Worsened by:  Bearing weight Ineffective treatments:  None tried      Home Medications Prior to Admission medications   Medication Sig Start Date End Date Taking? Authorizing Provider  calcium carbonate 1250 MG capsule Take 1,250 mg by mouth 2 (two) times daily with a meal.    [provider]  Carboxymethylcellul-Glycerin (LUBRICATING EYE DROPS OP) Apply to eye as needed.    [provider]  cholecalciferol (VITAMIN D) 1000 UNITS tablet Take 1,000 Units by mouth daily.    [provider]  eszopiclone (LUNESTA) 2 MG TABS tablet Take 2 mg by mouth as needed. 12/12/20   [provider]  metFORMIN (GLUCOPHAGE-XR) 500 MG 24 hr tablet Take 500 mg by mouth 2 (two) times daily. 07/19/20   [provider]  omeprazole (PRILOSEC) 20 MG capsule Take 20 mg by mouth daily.  Patient not taking: Reported on 04/13/2021    [provider]  pantoprazole (PROTONIX) 40 MG tablet Take 1 tablet by mouth 2 (two) times daily. 04/03/21   [provider]  pilocarpine (SALAGEN) 5 MG tablet Take 1 tablet (5 mg total) by mouth 3 (three) times daily as needed. 04/19/22   Gearldine Bienenstock, PA-C  Probiotic Product (PROBIOTIC PO) Take by mouth daily.     [provider]  rOPINIRole (REQUIP) 0.5 MG tablet Take 2 mg by mouth at bedtime. 09/03/20   [provider]  rosuvastatin (CRESTOR) 10 MG tablet Take 10 mg by mouth daily. 11/15/19   [provider]  valsartan-hydrochlorothiazide (DIOVAN-HCT) 320-25 MG per tablet Take 0.5 tablets by mouth daily.    [provider]      Allergies    Patient has no known allergies.    Review of Systems   Review of Systems  All other systems reviewed and are negative.   Physical Exam Updated Vital Signs BP 133/81   Pulse 89   Temp 98.5 F (36.9 C)   Resp 18   Wt 72.6 kg   SpO2 98%   BMI 25.44 kg/m  Physical Exam Vitals and nursing note reviewed.  Constitutional:      General: She is not in acute distress.    Appearance: Normal appearance. She is not ill-appearing.  HENT:     Head: Normocephalic and atraumatic.  Pulmonary:     Effort: Pulmonary effort is normal.  Musculoskeletal:     Comments: There is marked ecchymosis noted to the dorsum and lateral aspect of the right foot extending into the ankle.  Capillary refill is brisk and motor and sensation are intact to the toes.  Skin:    General: Skin is warm and dry.  Neurological:     Mental Status: She  is alert.     ED Results / Procedures / Treatments   Labs (all labs ordered are listed, but only abnormal results are displayed) Labs Reviewed - No data to display  EKG None  Radiology DG Foot Complete Right  Result Date: 05/28/2022 CLINICAL DATA:  Patient twisted foot on a step Saturday. EXAM: RIGHT FOOT COMPLETE - 3+ VIEW COMPARISON:  Mom right foot radiographs 08/29/2018 FINDINGS: There is a complex comminuted fracture involving the mid to distal fifth metatarsal. There is an oblique mildly displaced fracture involving the head of the fourth metatarsal. No evidence of dislocation. There is regional soft tissue swelling. IMPRESSION: 1.  Complex displaced fracture of the fifth metatarsal. 2.  Mildly  displaced oblique fracture of the fourth metatarsal. Electronically Signed   By: Emmaline Kluver M.D.   On: 05/28/2022 20:28    Procedures Procedures    Medications Ordered in ED Medications - No data to display  ED Course/ Medical Decision Making/ A&P  X-rays show fractures of both the fourth and fifth metatarsals.  Patient to be placed in a cam walker and advised nonweightbearing until followed up by orthopedics.  She has seen Dr. Eulah Pont in the past and will be referred back to him.  Final Clinical Impression(s) / ED Diagnoses Final diagnoses:  None    Rx / DC Orders ED Discharge Orders     None         Geoffery Lyons, MD 05/28/22 2311

## 2022-05-30 DIAGNOSIS — M25571 Pain in right ankle and joints of right foot: Secondary | ICD-10-CM | POA: Diagnosis not present

## 2022-06-14 DIAGNOSIS — M25571 Pain in right ankle and joints of right foot: Secondary | ICD-10-CM | POA: Diagnosis not present

## 2022-07-03 DIAGNOSIS — E119 Type 2 diabetes mellitus without complications: Secondary | ICD-10-CM | POA: Diagnosis not present

## 2022-07-03 DIAGNOSIS — Z7984 Long term (current) use of oral hypoglycemic drugs: Secondary | ICD-10-CM | POA: Diagnosis not present

## 2022-07-03 DIAGNOSIS — Z0000 Encounter for general adult medical examination without abnormal findings: Secondary | ICD-10-CM | POA: Diagnosis not present

## 2022-07-03 DIAGNOSIS — G2581 Restless legs syndrome: Secondary | ICD-10-CM | POA: Diagnosis not present

## 2022-07-03 DIAGNOSIS — D869 Sarcoidosis, unspecified: Secondary | ICD-10-CM | POA: Diagnosis not present

## 2022-07-03 DIAGNOSIS — E7800 Pure hypercholesterolemia, unspecified: Secondary | ICD-10-CM | POA: Diagnosis not present

## 2022-07-03 DIAGNOSIS — R69 Illness, unspecified: Secondary | ICD-10-CM | POA: Diagnosis not present

## 2022-07-03 DIAGNOSIS — K219 Gastro-esophageal reflux disease without esophagitis: Secondary | ICD-10-CM | POA: Diagnosis not present

## 2022-07-03 DIAGNOSIS — I10 Essential (primary) hypertension: Secondary | ICD-10-CM | POA: Diagnosis not present

## 2022-07-03 DIAGNOSIS — Z23 Encounter for immunization: Secondary | ICD-10-CM | POA: Diagnosis not present

## 2022-07-03 DIAGNOSIS — M3500 Sicca syndrome, unspecified: Secondary | ICD-10-CM | POA: Diagnosis not present

## 2022-07-11 DIAGNOSIS — M25571 Pain in right ankle and joints of right foot: Secondary | ICD-10-CM | POA: Diagnosis not present

## 2022-08-02 DIAGNOSIS — R269 Unspecified abnormalities of gait and mobility: Secondary | ICD-10-CM | POA: Diagnosis not present

## 2022-08-02 DIAGNOSIS — M6281 Muscle weakness (generalized): Secondary | ICD-10-CM | POA: Diagnosis not present

## 2022-08-02 DIAGNOSIS — S92354D Nondisplaced fracture of fifth metatarsal bone, right foot, subsequent encounter for fracture with routine healing: Secondary | ICD-10-CM | POA: Diagnosis not present

## 2022-08-02 DIAGNOSIS — S92344D Nondisplaced fracture of fourth metatarsal bone, right foot, subsequent encounter for fracture with routine healing: Secondary | ICD-10-CM | POA: Diagnosis not present

## 2022-08-10 DIAGNOSIS — S92344D Nondisplaced fracture of fourth metatarsal bone, right foot, subsequent encounter for fracture with routine healing: Secondary | ICD-10-CM | POA: Diagnosis not present

## 2022-08-14 DIAGNOSIS — R269 Unspecified abnormalities of gait and mobility: Secondary | ICD-10-CM | POA: Diagnosis not present

## 2022-08-14 DIAGNOSIS — M6281 Muscle weakness (generalized): Secondary | ICD-10-CM | POA: Diagnosis not present

## 2022-08-14 DIAGNOSIS — S92344D Nondisplaced fracture of fourth metatarsal bone, right foot, subsequent encounter for fracture with routine healing: Secondary | ICD-10-CM | POA: Diagnosis not present

## 2022-08-14 DIAGNOSIS — S92354D Nondisplaced fracture of fifth metatarsal bone, right foot, subsequent encounter for fracture with routine healing: Secondary | ICD-10-CM | POA: Diagnosis not present

## 2022-08-21 DIAGNOSIS — R269 Unspecified abnormalities of gait and mobility: Secondary | ICD-10-CM | POA: Diagnosis not present

## 2022-08-21 DIAGNOSIS — M6281 Muscle weakness (generalized): Secondary | ICD-10-CM | POA: Diagnosis not present

## 2022-08-21 DIAGNOSIS — S92354D Nondisplaced fracture of fifth metatarsal bone, right foot, subsequent encounter for fracture with routine healing: Secondary | ICD-10-CM | POA: Diagnosis not present

## 2022-08-21 DIAGNOSIS — S92344D Nondisplaced fracture of fourth metatarsal bone, right foot, subsequent encounter for fracture with routine healing: Secondary | ICD-10-CM | POA: Diagnosis not present

## 2022-08-26 IMAGING — CR DG CHEST 2V
2 series · 2 of 2 positions shown · non-contrast
Comparison: 02/15/2015

CLINICAL DATA: Sarcoidosis

EXAM:
CHEST - 2 VIEW

[chest pa]
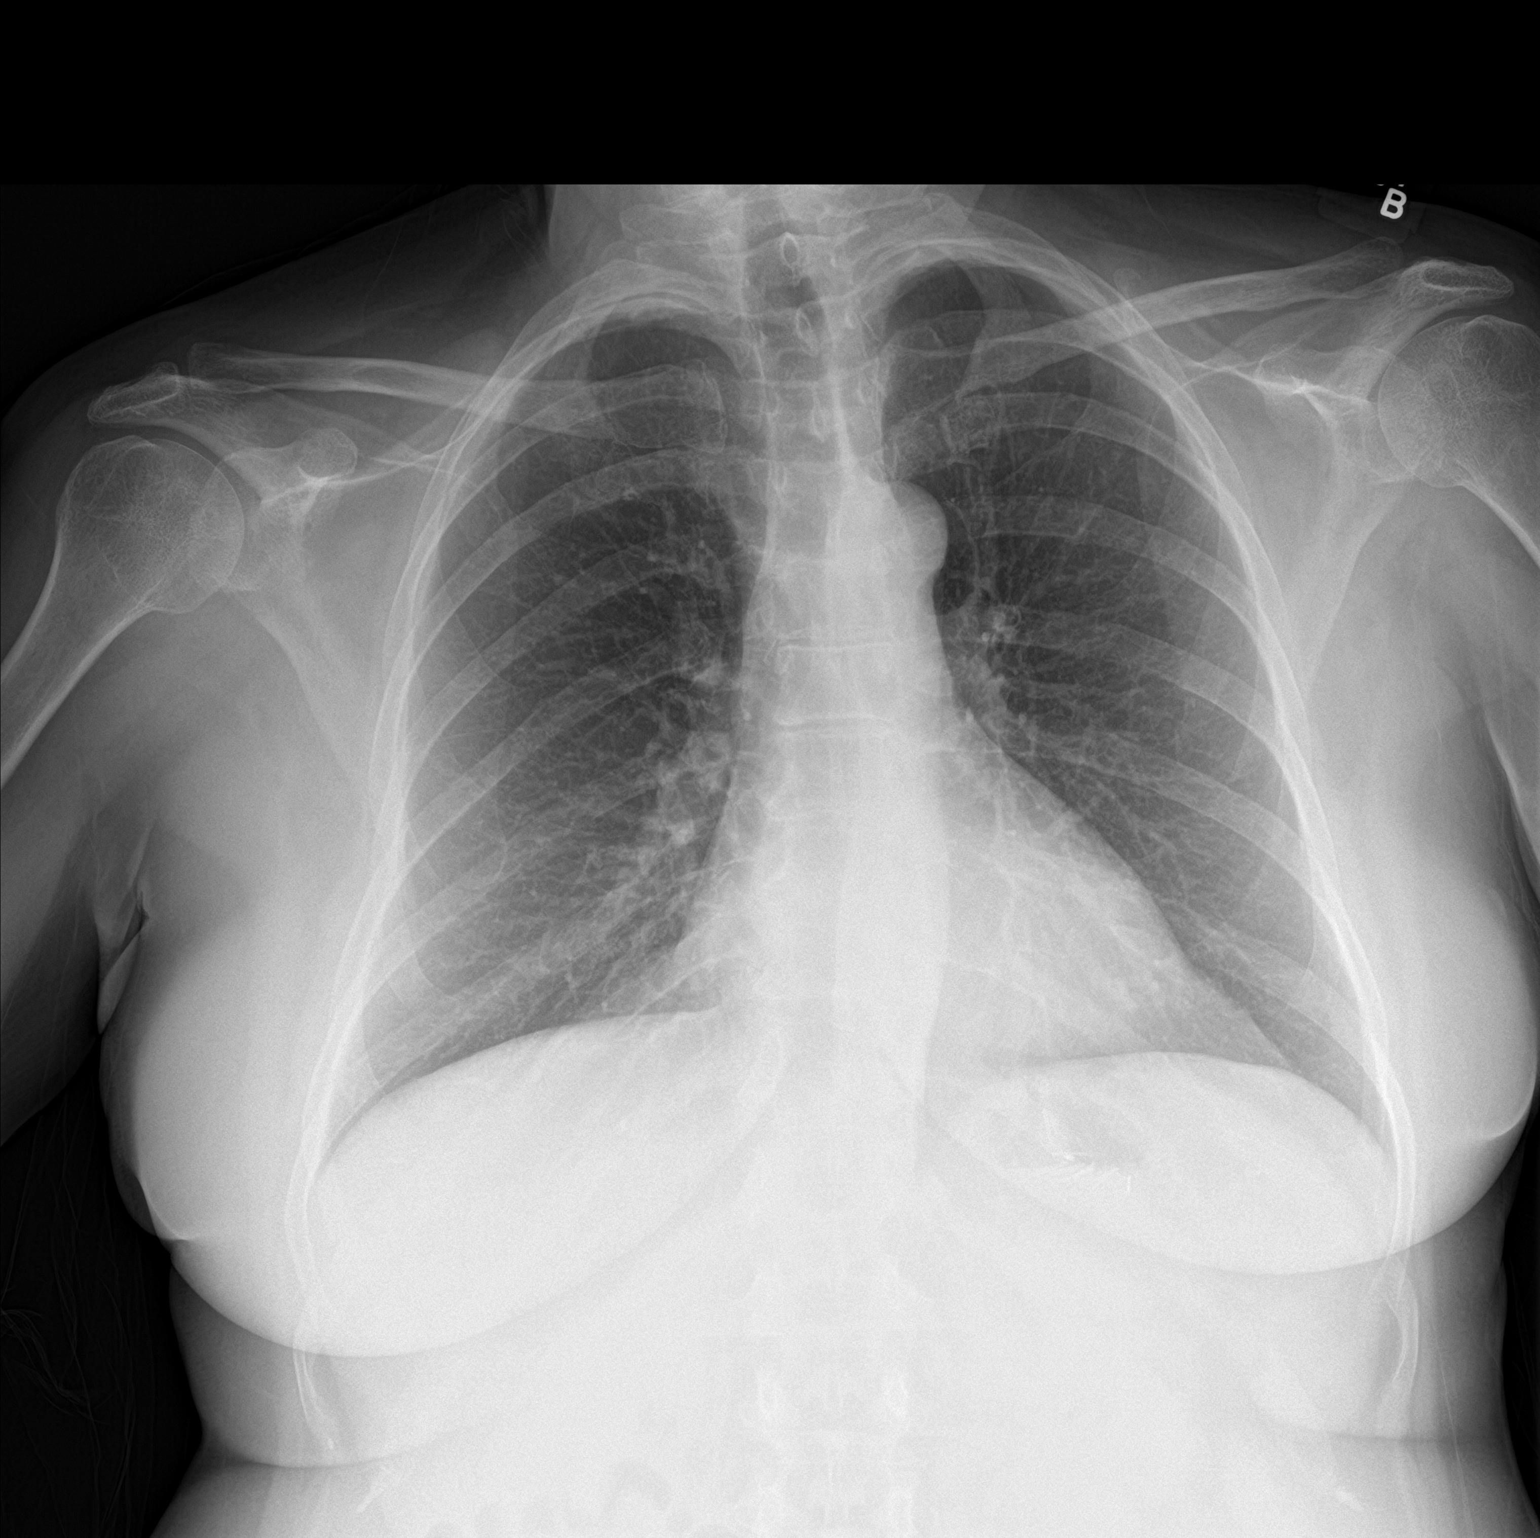

[chest lat]
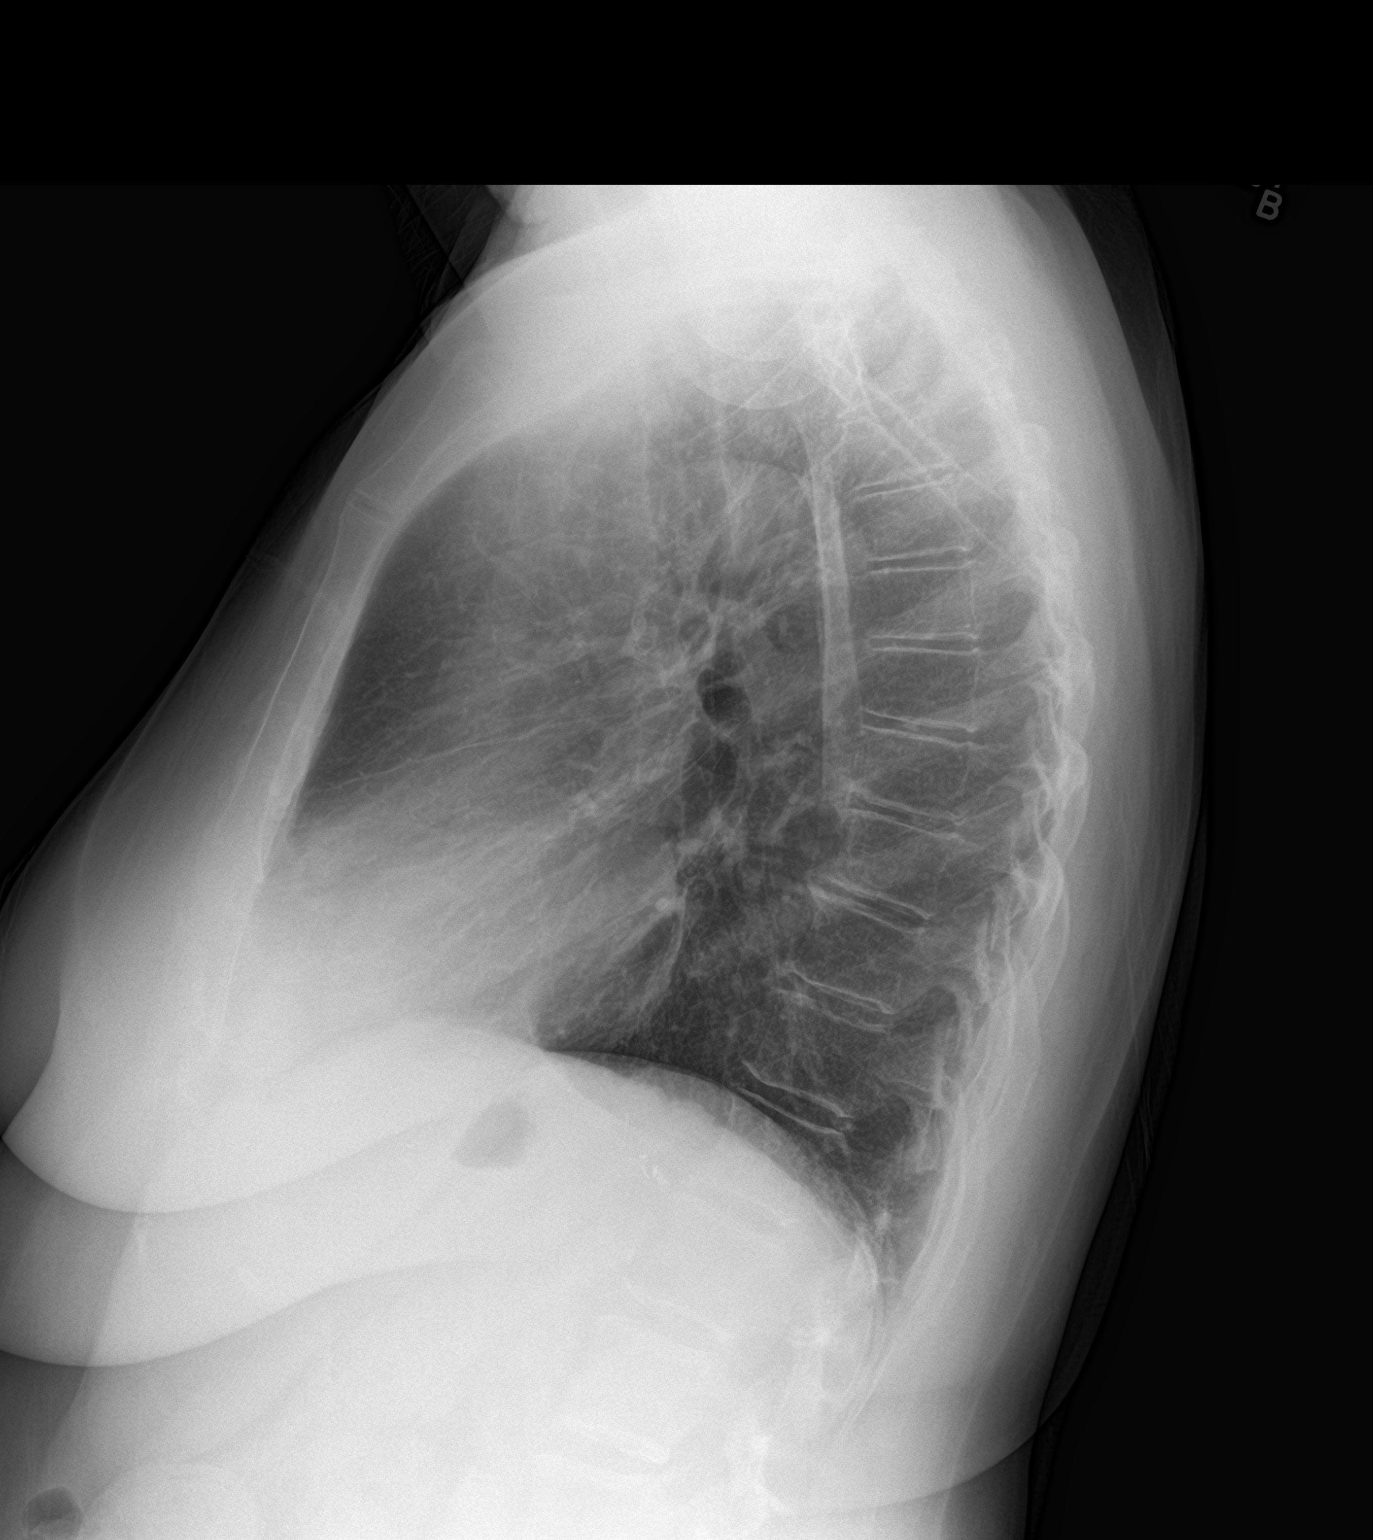

[2 of 2 positions shown; findings below may reference images not displayed]

FINDINGS: The heart size and mediastinal contours are within normal limits.
Both lungs are clear. The visualized skeletal structures are
unremarkable.
IMPRESSION: No active cardiopulmonary disease.

## 2022-08-29 DIAGNOSIS — S92354D Nondisplaced fracture of fifth metatarsal bone, right foot, subsequent encounter for fracture with routine healing: Secondary | ICD-10-CM | POA: Diagnosis not present

## 2022-08-29 DIAGNOSIS — M6281 Muscle weakness (generalized): Secondary | ICD-10-CM | POA: Diagnosis not present

## 2022-08-29 DIAGNOSIS — S92344D Nondisplaced fracture of fourth metatarsal bone, right foot, subsequent encounter for fracture with routine healing: Secondary | ICD-10-CM | POA: Diagnosis not present

## 2022-08-29 DIAGNOSIS — R269 Unspecified abnormalities of gait and mobility: Secondary | ICD-10-CM | POA: Diagnosis not present

## 2022-09-04 DIAGNOSIS — R269 Unspecified abnormalities of gait and mobility: Secondary | ICD-10-CM | POA: Diagnosis not present

## 2022-09-04 DIAGNOSIS — S92354D Nondisplaced fracture of fifth metatarsal bone, right foot, subsequent encounter for fracture with routine healing: Secondary | ICD-10-CM | POA: Diagnosis not present

## 2022-09-04 DIAGNOSIS — M6281 Muscle weakness (generalized): Secondary | ICD-10-CM | POA: Diagnosis not present

## 2022-09-04 DIAGNOSIS — S92344D Nondisplaced fracture of fourth metatarsal bone, right foot, subsequent encounter for fracture with routine healing: Secondary | ICD-10-CM | POA: Diagnosis not present

## 2022-09-26 DIAGNOSIS — S92354D Nondisplaced fracture of fifth metatarsal bone, right foot, subsequent encounter for fracture with routine healing: Secondary | ICD-10-CM | POA: Diagnosis not present

## 2022-09-26 DIAGNOSIS — R269 Unspecified abnormalities of gait and mobility: Secondary | ICD-10-CM | POA: Diagnosis not present

## 2022-09-26 DIAGNOSIS — M6281 Muscle weakness (generalized): Secondary | ICD-10-CM | POA: Diagnosis not present

## 2022-09-26 DIAGNOSIS — S92344D Nondisplaced fracture of fourth metatarsal bone, right foot, subsequent encounter for fracture with routine healing: Secondary | ICD-10-CM | POA: Diagnosis not present

## 2022-10-10 DIAGNOSIS — S92354D Nondisplaced fracture of fifth metatarsal bone, right foot, subsequent encounter for fracture with routine healing: Secondary | ICD-10-CM | POA: Diagnosis not present

## 2022-10-10 DIAGNOSIS — M6281 Muscle weakness (generalized): Secondary | ICD-10-CM | POA: Diagnosis not present

## 2022-10-10 DIAGNOSIS — R269 Unspecified abnormalities of gait and mobility: Secondary | ICD-10-CM | POA: Diagnosis not present

## 2022-10-10 DIAGNOSIS — S92344D Nondisplaced fracture of fourth metatarsal bone, right foot, subsequent encounter for fracture with routine healing: Secondary | ICD-10-CM | POA: Diagnosis not present

## 2022-10-16 NOTE — Progress Notes (Signed)
Office Visit Note  Patient: Alison [name]             Date of Birth: [birth year]           MRN: [id number]             PCP: Aletha Halim., PA-C Referring: Aletha Halim., PA-C Visit Date: 10/30/2022 Occupation: '@GUAROCC'$ @  Subjective:    History of Present Illness: Alison [name] is a 66 y.o. female history of Sjogren's, sarcoidosis, osteoarthritis and osteopenia.  She denies any increased shortness of breath.  She continues to have dry mouth and dry eyes.  She states she decided not to get pilocarpine as it was expensive.  But she would like to try it through [facility].  She denies any history of recent dental issues.  She has been using Biotene products and eyedrops which are not very effective.  She denies any shortness of breath.  She denies any history of joint swelling.  She has some stiffness in her hands due to osteoarthritis.  She has been walking and taking calcium and vitamin D.  Activities of Daily Living:  Patient reports morning stiffness for 0 minutes.   Patient Denies nocturnal pain.  Difficulty dressing/grooming: Denies Difficulty climbing stairs: Denies Difficulty getting out of chair: Denies Difficulty using hands for taps, buttons, cutlery, and/or writing: Denies  Review of Systems  Constitutional:  Positive for fatigue.  HENT:  Positive for mouth dryness. Negative for mouth sores.   Eyes:  Positive for dryness.  Respiratory:  Negative for shortness of breath.   Cardiovascular:  Negative for chest pain and palpitations.  Gastrointestinal:  Negative for blood in stool, constipation and diarrhea.  Endocrine: Negative for increased urination.  Genitourinary:  Negative for involuntary urination.  Musculoskeletal:  Positive for joint pain and joint pain. Negative for gait problem, joint swelling, myalgias, muscle weakness, morning stiffness, muscle tenderness and myalgias.  Skin:  Negative for color change, rash, hair loss and sensitivity to sunlight.   Allergic/Immunologic: Negative for susceptible to infections.  Neurological:  Negative for dizziness and headaches.  Hematological:  Negative for swollen glands.  Psychiatric/Behavioral:  Positive for sleep disturbance. Negative for depressed mood. The patient is not nervous/anxious.     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   At risk for coronary artery disease 04/25/2011   Endometriosis    Hypertension    Hyperlipidemia    RLS (restless legs syndrome)     Past Medical History:  Diagnosis Date   Anemia    hx of due to heavy menstral cycles   Chest pain    Endometriosis    GERD (gastroesophageal reflux disease)    Headache(784.0)    occassional headaches   Hyperlipidemia    Hypertension    Inflammatory arthritis    Mediastinal adenopathy March 2013   PET HOT, High ACE level, EBUS 02/26/12   RLS (restless legs syndrome)    Vitamin D deficiency     Family History  Problem Relation Age of Onset   Heart disease Father    Heart attack Father    Hypertension Father    Alzheimer's disease Father    Asthma Mother  Diabetes Mother        and sister, MGM   Cancer Maternal Grandfather    Hypertension Paternal Grandmother    Past Surgical History:  Procedure Laterality Date   CESAREAN SECTION     ENDOMETRIAL BIOPSY     FINGER SURGERY     x2   TONSILLECTOMY     TUMOR REMOVAL     gastric schwannoma   Social History   Social History Narrative   Not on file   Immunization History  Administered Date(s) Administered   Influenza Split 09/03/2011, 10/03/2012   Influenza, Quadrivalent, Recombinant, Inj, Pf  08/14/2019   Pneumococcal Polysaccharide-23 01/04/2012   Zoster Recombinat (Shingrix) 06/20/2018, 03/07/2019     Objective: Vital Signs: BP 111/79 (BP Location: Left Arm, Patient Position: Sitting, Cuff Size: Normal)   Pulse 88   Resp 14   Ht 5' 6.5" (1.689 m)   Wt 163 lb 12.8 oz (74.3 kg)   BMI 26.04 kg/m    Physical Exam Vitals and nursing note reviewed.  Constitutional:      Appearance: She is well-developed.  HENT:     Head: Normocephalic and atraumatic.  Eyes:     Conjunctiva/sclera: Conjunctivae normal.  Cardiovascular:     Rate and Rhythm: Normal rate and regular rhythm.     Heart sounds: Normal heart sounds.  Pulmonary:     Effort: Pulmonary effort is normal.     Breath sounds: Normal breath sounds.  Abdominal:     General: Bowel sounds are normal.     Palpations: Abdomen is soft.  Musculoskeletal:     Cervical back: Normal range of motion.  Lymphadenopathy:     Cervical: No cervical adenopathy.  Skin:    General: Skin is warm and dry.     Capillary Refill: Capillary refill takes less than 2 seconds.  Neurological:     Mental Status: She is alert and oriented to person, place, and time.  Psychiatric:        Behavior: Behavior normal.      Musculoskeletal Exam: Cervical, thoracic and lumbar spine were in good range of motion.  Mild thoracic kyphosis was noted.  Shoulder joints, elbow joints, wrist joints, MCPs PIPs and DIPs been good range of motion with no synovitis.  Hip joints with good range of motion without any discomfort.  Knee joints in good range of motion with no warmth swelling or effusion.  There was no tenderness over ankles or MTPs.  CDAI Exam: CDAI Score: -- Patient Global: --; Provider Global: -- Swollen: --; Tender: -- Joint Exam 10/30/2022   No joint exam has been documented for this visit   There is currently no information documented on the homunculus. Go to the Rheumatology activity and complete the homunculus joint  exam.  Investigation: No additional findings.  Imaging: No results found.  Recent Labs: Lab Results  Component Value Date   WBC 8.5 04/19/2022   HGB 11.6 (L) 04/19/2022   PLT 365 04/19/2022   NA 136 04/19/2022   K 4.8 04/19/2022   CL 97 (L) 04/19/2022   CO2 29 04/19/2022   GLUCOSE 117 (H) 04/19/2022   BUN 11 04/19/2022   CREATININE 0.65 04/19/2022   BILITOT 0.7 04/19/2022   ALKPHOS 100 05/31/2017   AST 27 04/19/2022   ALT 31 (H) 04/19/2022   PROT 6.8 04/19/2022   ALBUMIN 4.2 05/31/2017   CALCIUM 9.8 04/19/2022   GFRAA 108 06/25/2019    Speciality Comments: No specialty comments available.  Procedures:  No  procedures performed Allergies: Patient has no known allergies.   Assessment / Plan:     Visit Diagnoses: Sarcoidosis, lung, stage I - +ACE, hilar lymphadenopathy Chest x-ray 2019 was negative: No recurrence.  Chest x-ray did not reveal any active cardiopulmonary disease on 10/13/2021.  She denies any shortness of breath.  Lungs were clear to auscultation.  I offered PFTs which she declined.  Hilar lymphadenopathy - No hilar lymphadenopathy on her chest x-ray in 2019.  Chest x-ray on 10/14/2021 was negative.  High risk medication use - Discontinued Methotrexate 3 tablets po once weekly and folic acid in February 2020 due to elevated LFTs. - Plan: CBC with Differential/Platelet, COMPLETE METABOLIC PANEL WITH GFR  Primary osteoarthritis of both hands-she has bilateral PIP and DIP thickening consistent with osteoarthritis.  Joint protection muscle strengthening was discussed.  Sjogren's syndrome with other organ involvement ([place]) - Positive ANA and +Ro: -Patient states that her dry mouth and dry eye symptoms are getting worse.  She could not get the pilocarpine prescription after the last visit.  She wants to refill again with the help of [facility].  A prescription for pilocarpine was sent again.  She will try 5 mg p.o. 3 times daily as needed.  Over-the-counter products were  also discussed at length.  Increased risk of ILD with Sjogren's was discussed.  Patient denies any shortness of breath.  She is not interested in getting PFTs at this time.  I will obtain labs today.  Plan: Urinalysis, Routine w reflex microscopic, Anti-DNA antibody, double-stranded, C3 and C4, Sedimentation rate, Sjogrens syndrome-A extractable nuclear antibody, Serum protein electrophoresis with reflex  Osteopenia of multiple sites - Previously tolerated Fosamax from November 2017 to July 2019.  DEXA 05/04/2021: AP spine T score -2.3, BMD 0.799% change -4%.  She has been taking calcium and vitamin D.  She is currently not taking any bisphosphonates.  Will repeat DEXA scan in June 2024.  Vitamin D deficiency-she takes vitamin D 1000 units daily.  History of hyperlipidemia  History of hypertension-blood pressure was normal today.  RLS (restless legs syndrome) -she states despite taking medications her restless leg syndrome is getting worse.  She requests referral to neurology.  Plan: Ambulatory referral to Neurology  Primary insomnia -she is concerned that she may have sleep apnea.  Will refer to neurology for evaluation.  Plan: Ambulatory referral to Neurology  Orders: Orders Placed This Encounter  Procedures   Urinalysis, Routine w reflex microscopic   CBC with Differential/Platelet   COMPLETE METABOLIC PANEL WITH GFR   Anti-DNA antibody, double-stranded   C3 and C4   Sedimentation rate   Sjogrens syndrome-A extractable nuclear antibody   Serum protein electrophoresis with reflex   Ambulatory referral to Neurology   Meds ordered this encounter  Medications   pilocarpine (SALAGEN) 5 MG tablet    Sig: Take 1 tablet (5 mg total) by mouth 3 (three) times daily as needed.    Dispense:  90 tablet    Refill:  2     Follow-Up Instructions: Return in about 5 months (around 03/31/2023) for Sarcoidosis.   Bo Merino, MD  Note - This record has been created using Editor, commissioning.   Chart creation errors have been sought, but may not always  have been located. Such creation errors do not reflect on  the standard of medical care.

## 2022-10-17 ENCOUNTER — Ambulatory Visit: Payer: Medicare HMO | Admitting: Rheumatology

## 2022-10-30 ENCOUNTER — Encounter: Payer: Self-pay | Admitting: Rheumatology

## 2022-10-30 ENCOUNTER — Ambulatory Visit: Payer: Medicare HMO | Attending: Rheumatology | Admitting: Rheumatology

## 2022-10-30 VITALS — BP 111/79 | HR 88 | Resp 14 | Ht 66.5 in | Wt 163.8 lb

## 2022-10-30 DIAGNOSIS — Z8679 Personal history of other diseases of the circulatory system: Secondary | ICD-10-CM | POA: Diagnosis not present

## 2022-10-30 DIAGNOSIS — F5101 Primary insomnia: Secondary | ICD-10-CM | POA: Diagnosis not present

## 2022-10-30 DIAGNOSIS — Z79899 Other long term (current) drug therapy: Secondary | ICD-10-CM | POA: Diagnosis not present

## 2022-10-30 DIAGNOSIS — G2581 Restless legs syndrome: Secondary | ICD-10-CM | POA: Diagnosis not present

## 2022-10-30 DIAGNOSIS — D860 Sarcoidosis of lung: Secondary | ICD-10-CM | POA: Diagnosis not present

## 2022-10-30 DIAGNOSIS — E611 Iron deficiency: Secondary | ICD-10-CM | POA: Diagnosis not present

## 2022-10-30 DIAGNOSIS — R590 Localized enlarged lymph nodes: Secondary | ICD-10-CM

## 2022-10-30 DIAGNOSIS — E559 Vitamin D deficiency, unspecified: Secondary | ICD-10-CM

## 2022-10-30 DIAGNOSIS — M19041 Primary osteoarthritis, right hand: Secondary | ICD-10-CM | POA: Diagnosis not present

## 2022-10-30 DIAGNOSIS — M19042 Primary osteoarthritis, left hand: Secondary | ICD-10-CM | POA: Diagnosis not present

## 2022-10-30 DIAGNOSIS — M3509 Sicca syndrome with other organ involvement: Secondary | ICD-10-CM | POA: Diagnosis not present

## 2022-10-30 DIAGNOSIS — M8589 Other specified disorders of bone density and structure, multiple sites: Secondary | ICD-10-CM | POA: Diagnosis not present

## 2022-10-30 DIAGNOSIS — Z8639 Personal history of other endocrine, nutritional and metabolic disease: Secondary | ICD-10-CM | POA: Diagnosis not present

## 2022-10-30 MED ORDER — PILOCARPINE HCL 5 MG PO TABS: 5.0000 mg | ORAL_TABLET | Freq: Three times a day (TID) | ORAL | 2 refills | Status: DC | PRN

## 2022-11-03 LAB — URINALYSIS, ROUTINE W REFLEX MICROSCOPIC
Bilirubin Urine: NEGATIVE
Glucose, UA: NEGATIVE
Hgb urine dipstick: NEGATIVE
Ketones, ur: NEGATIVE
Leukocytes,Ua: NEGATIVE
Nitrite: NEGATIVE
Protein, ur: NEGATIVE
Specific Gravity, Urine: 1.02 (ref 1.001–1.035)
pH: <=5 (ref 5.0–8.0)

## 2022-11-03 LAB — CBC WITH DIFFERENTIAL/PLATELET
Absolute Monocytes: 628 cells/uL (ref 200–950)
Basophils Absolute: 52 cells/uL (ref 0–200)
Basophils Relative: 0.6 %
Eosinophils Absolute: 378 cells/uL (ref 15–500)
Eosinophils Relative: 4.4 %
HCT: 35.1 % (ref 35.0–45.0)
Hemoglobin: 11.3 g/dL — ABNORMAL LOW (ref 11.7–15.5)
Lymphs Abs: 1978 cells/uL (ref 850–3900)
MCH: 25.1 pg — ABNORMAL LOW (ref 27.0–33.0)
MCHC: 32.2 g/dL (ref 32.0–36.0)
MCV: 77.8 fL — ABNORMAL LOW (ref 80.0–100.0)
MPV: 11.4 fL (ref 7.5–12.5)
Monocytes Relative: 7.3 %
Neutro Abs: 5564 cells/uL (ref 1500–7800)
Neutrophils Relative %: 64.7 %
Platelets: 421 10*3/uL — ABNORMAL HIGH (ref 140–400)
RBC: 4.51 10*6/uL (ref 3.80–5.10)
RDW: 13.5 % (ref 11.0–15.0)
Total Lymphocyte: 23 %
WBC: 8.6 10*3/uL (ref 3.8–10.8)

## 2022-11-03 LAB — COMPLETE METABOLIC PANEL WITH GFR
AG Ratio: 1.5 (calc) (ref 1.0–2.5)
ALT: 26 U/L (ref 6–29)
AST: 25 U/L (ref 10–35)
Albumin: 4.3 g/dL (ref 3.6–5.1)
Alkaline phosphatase (APISO): 121 U/L (ref 37–153)
BUN: 8 mg/dL (ref 7–25)
CO2: 27 mmol/L (ref 20–32)
Calcium: 10 mg/dL (ref 8.6–10.4)
Chloride: 99 mmol/L (ref 98–110)
Creat: 0.65 mg/dL (ref 0.50–1.05)
Globulin: 2.9 g/dL (calc) (ref 1.9–3.7)
Glucose, Bld: 88 mg/dL (ref 65–139)
Potassium: 4.2 mmol/L (ref 3.5–5.3)
Sodium: 137 mmol/L (ref 135–146)
Total Bilirubin: 0.7 mg/dL (ref 0.2–1.2)
Total Protein: 7.2 g/dL (ref 6.1–8.1)
eGFR: 96 mL/min/{1.73_m2} (ref >=60)

## 2022-11-03 LAB — TEST AUTHORIZATION

## 2022-11-03 LAB — IRON,TIBC AND FERRITIN PANEL
%SAT: 6 % (calc) — ABNORMAL LOW (ref 16–45)
Ferritin: 6 ng/mL — ABNORMAL LOW (ref 16–288)
Iron: 27 ug/dL — ABNORMAL LOW (ref 45–160)
TIBC: 463 mcg/dL (calc) — ABNORMAL HIGH (ref 250–450)

## 2022-11-03 LAB — SJOGRENS SYNDROME-A EXTRACTABLE NUCLEAR ANTIBODY: SSA (Ro) (ENA) Antibody, IgG: <1 AI

## 2022-11-03 LAB — PROTEIN ELECTROPHORESIS, SERUM, WITH REFLEX
Albumin ELP: 3.9 g/dL (ref 3.8–4.8)
Alpha 1: 0.3 g/dL (ref 0.2–0.3)
Alpha 2: 0.9 g/dL (ref 0.5–0.9)
Beta 2: 0.4 g/dL (ref 0.2–0.5)
Beta Globulin: 0.6 g/dL (ref 0.4–0.6)
Gamma Globulin: 0.8 g/dL (ref 0.8–1.7)
Total Protein: 7 g/dL (ref 6.1–8.1)

## 2022-11-03 LAB — ANTI-DNA ANTIBODY, DOUBLE-STRANDED: ds DNA Ab: 1 IU/mL

## 2022-11-03 LAB — C3 AND C4
C3 Complement: 157 mg/dL (ref 83–193)
C4 Complement: 29 mg/dL (ref 15–57)

## 2022-11-03 LAB — SEDIMENTATION RATE: Sed Rate: 25 mm/h (ref 0–30)

## 2022-11-05 NOTE — Progress Notes (Signed)
Labs indicate iron deficiency.  Please advise patient to contact her PCP for the evaluation and treatment of iron deficiency anemia.  In the meantime she can start over-the-counter multivitamin with iron.  Please forward results to her PCP.

## 2022-12-13 ENCOUNTER — Encounter: Payer: Self-pay | Admitting: Neurology

## 2022-12-13 ENCOUNTER — Ambulatory Visit: Payer: Medicare HMO | Admitting: Neurology

## 2022-12-13 VITALS — BP 123/68 | HR 89 | Ht 67.0 in | Wt 163.0 lb

## 2022-12-13 DIAGNOSIS — E611 Iron deficiency: Secondary | ICD-10-CM

## 2022-12-13 DIAGNOSIS — R0681 Apnea, not elsewhere classified: Secondary | ICD-10-CM | POA: Diagnosis not present

## 2022-12-13 DIAGNOSIS — G479 Sleep disorder, unspecified: Secondary | ICD-10-CM | POA: Diagnosis not present

## 2022-12-13 DIAGNOSIS — G4719 Other hypersomnia: Secondary | ICD-10-CM

## 2022-12-13 DIAGNOSIS — Z9189 Other specified personal risk factors, not elsewhere classified: Secondary | ICD-10-CM

## 2022-12-13 DIAGNOSIS — R0683 Snoring: Secondary | ICD-10-CM

## 2022-12-13 DIAGNOSIS — R351 Nocturia: Secondary | ICD-10-CM | POA: Diagnosis not present

## 2022-12-13 DIAGNOSIS — R790 Abnormal level of blood mineral: Secondary | ICD-10-CM

## 2022-12-13 DIAGNOSIS — G2581 Restless legs syndrome: Secondary | ICD-10-CM | POA: Diagnosis not present

## 2022-12-13 NOTE — Progress Notes (Signed)
Subjective:    Patient ID: Alison [name] is a 67 y.o. female.  HPI    Star Age, MD, PhD [facility] [address], [address] [place], [place] [address]  Dear Abel Presto,  I saw your patient, Alison [name], upon your kind request in my sleep clinic today for initial consultation of her restless leg symptoms.  The patient is unaccompanied today.  As you know, Alison [name] is a 67 year old female with an underlying medical history of Sjogren's syndrome, sarcoidosis, osteoarthritis, osteopenia, anemia, endometriosis, reflux disease, headaches, hypertension, hyperlipidemia, vitamin D deficiency, and borderline overweight state, who reports a longstanding history of restless leg syndrome of at least 25 years.  She has been on ropinirole for the past at least 4 years per PCP.  She currently takes 0.5 mg at 8 PM and 1.5 mg at bedtime which is around 11 PM she still has residual symptoms and often does not sleep through the night, waking up with restlessness but also other sleep disruption secondary to nocturia which is at least 3-4 times per average night.  Of note, she does snore, her husband has noted occasional pauses in her breathing and she has rarely woken herself up with a sense of gasping for air.  She has never had a sleep study.  Symptoms have become worse over time including her sleep disruption and restless leg symptoms which she describes as a creepy crawly sensation, she does not typically twitch or kick her legs in her sleep but does toss and turn a lot.  Her Epworth sleepiness score is 9 out of 24, fatigue severity score is 39 out of 63.  She is married and lives with her husband, she works as a Psychologist, counselling.  She has 1 grown son.  Her weight has been more or less stable.  She denies recurrent nocturnal or morning headaches but has nocturia.  She brought in a sleep log and restless leg symptom log for me today which started with end of October 2023.  I reviewed her  symptoms and log.  She is a non-smoker.  She drinks alcohol occasionally, about twice a week, she drinks caffeine in the form of tea, 2 servings per day on average.  I reviewed your office note from 10/30/2022.  She had blood work at the time which I reviewed.  Iron studies showed severely low iron with total iron 27, TIBC elevated at 463, percent saturation very low at 6, ferritin very low at 6.  CBC also showed a mildly low hemoglobin at 11.3, hematocrit below normal at 77.8, [facility] also below normal at 25.1, platelets mildly elevated at 421.  She was encouraged to follow-up with primary care for iron deficiency management.  She has not had a recent TSH.  She has an upcoming appointment with her primary care next month.  She has started an iron supplement over-the-counter.  She does report lack of appetite and some weight loss recently, has not had it investigated yet.  She does not eat meat and has always been like this but has had a change in her appetite.  Her Past Medical History Is Significant For: Past Medical History:  Diagnosis Date   Anemia    hx of due to heavy menstral cycles   Chest pain    Endometriosis    GERD (gastroesophageal reflux disease)    Headache(784.0)    occassional headaches   Hyperlipidemia    Hypertension    Inflammatory arthritis    Mediastinal  adenopathy March 2013   PET HOT, High ACE level, EBUS 02/26/12   RLS (restless legs syndrome)    Vitamin D deficiency     Her Past Surgical History Is Significant For: Past Surgical History:  Procedure Laterality Date   CESAREAN SECTION     ENDOMETRIAL BIOPSY     FINGER SURGERY     x2   TONSILLECTOMY     TUMOR REMOVAL     gastric schwannoma    Her Family History Is Significant For: Family History  Problem Relation Age of Onset   Asthma Mother    Diabetes Mother        and sister, MGM   Heart disease Father    Heart attack Father    Hypertension Father    Alzheimer's disease Father    Cancer Maternal  Grandfather    Hypertension Paternal Grandmother    Sleep apnea Neg Hx     Her Social History Is Significant For: Social History   Socioeconomic History   Marital status: Married    Spouse name: Not on file   Number of children: 1   Years of education: Not on file   Highest education level: Not on file  Occupational History   Occupation: Art therapist  Tobacco Use   Smoking status: Never    Passive exposure: Never   Smokeless tobacco: Never  Vaping Use   Vaping Use: Never used  Substance and Sexual Activity   Alcohol use: Yes    Comment: social   Drug use: Never   Sexual activity: Not on file  Other Topics Concern   Not on file  Social History Narrative   Not on file   Social Determinants of Health   Financial Resource Strain: Not on file  Food Insecurity: Not on file  Transportation Needs: Not on file  Physical Activity: Not on file  Stress: Not on file  Social Connections: Not on file    Her Allergies Are:  No Known Allergies:   Her Current Medications Are:  Outpatient Encounter Medications as of 12/13/2022  Medication Sig   calcium carbonate 1250 MG capsule Take 1,250 mg by mouth 2 (two) times daily with a meal.   Carboxymethylcellul-Glycerin (LUBRICATING EYE DROPS OP) Apply to eye as needed.   cholecalciferol (VITAMIN D) 1000 UNITS tablet Take 1,000 Units by mouth daily.   eszopiclone (LUNESTA) 2 MG TABS tablet Take 2 mg by mouth as needed.   metFORMIN (GLUCOPHAGE-XR) 500 MG 24 hr tablet Take 500 mg by mouth 2 (two) times daily.   pantoprazole (PROTONIX) 40 MG tablet Take 1 tablet by mouth 2 (two) times daily.   pilocarpine (SALAGEN) 5 MG tablet Take 1 tablet (5 mg total) by mouth 3 (three) times daily as needed.   rOPINIRole (REQUIP) 0.5 MG tablet Take 2 mg by mouth at bedtime.   rosuvastatin (CRESTOR) 10 MG tablet Take 10 mg by mouth daily.   valsartan-hydrochlorothiazide (DIOVAN-HCT) 320-25 MG per tablet Take 0.5 tablets by mouth daily.   No  facility-administered encounter medications on file as of 12/13/2022.  :   Review of Systems:  Out of a complete 14 point review of systems, all are reviewed and negative with the exception of these symptoms as listed below:   Review of Systems  Neurological:        Pt here for sleep consult  Pt states snore,fatigue,hypertension Pt denies sleep study,CPAP machine  Pt states she has RLS also    ESS:9 FSS:39    Objective:  Neurological Exam  Physical Exam Physical Examination:   Vitals:   12/13/22 1130  BP: 123/68  Pulse: 89    General Examination: The patient is a very pleasant 66 y.o. female in no acute distress. She appears well-developed and well-nourished and well groomed.   HEENT: Normocephalic, atraumatic, pupils are equal, round and reactive to light, extraocular tracking is good without limitation to gaze excursion or nystagmus noted. Hearing is grossly intact. Face is symmetric with normal facial animation. Speech is clear with no dysarthria noted. There is no hypophonia. There is no lip, neck/head, jaw or voice tremor. Neck is supple with full range of passive and active motion. There are no carotid bruits on auscultation. Oropharynx exam reveals: moderate mouth dryness, adequate dental hygiene and moderate airway crowding, due to small airway, Mallampati class IV.  She had a tonsillectomy.  Uvula not fully visualized. Tongue protrudes centrally.  Palate elevates symmetrically.  Chest: Clear to auscultation without wheezing, rhonchi or crackles noted.  Heart: S1+S2+0, regular and normal without murmurs, rubs or gallops noted.   Abdomen: Soft, non-tender and non-distended.  Extremities: There is no pitting edema in the distal lower extremities bilaterally.   Skin: Warm and dry without trophic changes noted.   Musculoskeletal: exam reveals no obvious joint deformities.   Neurologically:  Mental status: The patient is awake, alert and oriented in all 4 spheres. Her  immediate and remote memory, attention, language skills and fund of knowledge are appropriate. There is no evidence of aphasia, agnosia, apraxia or anomia. Speech is clear with normal prosody and enunciation. Thought process is linear. Mood is normal and affect is normal.  Cranial nerves II - XII are as described above under HEENT exam.  Motor exam: Normal bulk, strength and tone is noted. There is no obvious action or resting tremor.  Fine motor skills and coordination: grossly intact.  Cerebellar testing: No dysmetria or intention tremor. There is no truncal or gait ataxia.  Sensory exam: intact to light touch in the upper and lower extremities.  Gait, station and balance: She stands easily. No veering to one side is noted. No leaning to one side is noted. Posture is age-appropriate and stance is narrow based. Gait shows normal stride length and normal pace. No problems turning are noted.   Assessment and Plan:   In summary, Alison [name] is a very pleasant 67 y.o.-year old female with an underlying medical history of Sjogren's syndrome, sarcoidosis, osteoarthritis, osteopenia, anemia, endometriosis, reflux disease, headaches, hypertension, hyperlipidemia, vitamin D deficiency, and borderline overweight state, who presents for evaluation of her sleep disturbance including longstanding history of restless leg syndrome.  We talked about RLS at length today and also other sleep disorders including sleep disordered breathing which could also be a underlying cause for her sleep disruption and daytime somnolence and nonrestorative sleep.  We talked about causes for RLS, we talked about alleviating factors, she is on symptomatic treatment with a dopamine agonist for the past few years and is currently taking a decent dose.  She is advised that it can take a long time to kick in and therefore she is advised to take her 0.5 mg dose at 6 or 7 PM and her 1.5 mg dose around 9 PM if her bedtime is around 11 PM.   Iron deficiency and anemia as well as thyroid dysfunction may exacerbate restless leg symptoms, she is quite deficient in our end, she has an appointment coming up with her primary care and is advised to investigate  causes for anemia.  She used to have heavy.'s.  She also endorses lack of appetite and some weight reduction.   I had a long chat with the patient about my findings and the diagnosis of sleep apnea as well today, particularly OSA, its prognosis and treatment options. We talked about medical/conservative treatments, surgical interventions and non-pharmacological approaches for symptom control. I explained, in particular, the risks and ramifications of untreated moderate to severe OSA, especially with respect to developing cardiovascular disease down the road, including congestive heart failure (CHF), difficult to treat hypertension, cardiac arrhythmias (particularly A-fib), neurovascular complications including TIA, stroke and dementia. Even type 2 diabetes has, in part, been linked to untreated OSA. Symptoms of untreated OSA may include (but may not be limited to) daytime sleepiness, nocturia (i.e. frequent nighttime urination), memory problems, mood irritability and suboptimally controlled or worsening mood disorder such as depression and/or anxiety, lack of energy, lack of motivation, physical discomfort, as well as recurrent headaches, especially morning or nocturnal headaches. We talked about the importance of maintaining a healthy lifestyle and striving for healthy weight. I recommended a sleep study at this time. I outlined the differences between a laboratory attended sleep study which is considered more comprehensive and accurate over the option of a home sleep test (HST); the latter may lead to underestimation of sleep disordered breathing in some instances and does not help with diagnosing upper airway resistance syndrome and is not accurate enough to diagnose primary central sleep apnea  typically. I outlined possible surgical and non-surgical treatment options of OSA, including the use of a positive airway pressure (PAP) device (i.e. CPAP, AutoPAP/APAP or BiPAP in certain circumstances), a custom-made dental device (aka oral appliance, which would require a referral to a specialist dentist or orthodontist typically, and is generally speaking not considered for patients with full dentures or edentulous state), upper airway surgical options, such as traditional UPPP (which is not considered a first-line treatment) or the Inspire device (hypoglossal nerve stimulator, which would involve a referral for consultation with an ENT surgeon, after careful selection, following inclusion criteria - also not first-line treatment). I explained the PAP treatment option to the patient in detail, as this is generally considered first-line treatment.  The patient indicated that she would be reluctant but willing to try PAP therapy, if the need arises. I explained the importance of being compliant with PAP treatment, not only for insurance purposes but primarily to improve patient's symptoms symptoms, and for the patient's long term health benefit, including to reduce Her cardiovascular risks longer-term.    We will pick up our discussion about the next steps and treatment options after testing.  We will keep her posted as to the test results by phone call and/or MyChart messaging where possible.  We will plan to follow-up in sleep clinic accordingly as well.  I answered all her questions today and the patient was in agreement.   I encouraged her to call with any interim questions, concerns, problems or updates or email [place] through [place].  Generally speaking, sleep test authorizations may take up to 2 weeks, sometimes less, sometimes longer, the patient is encouraged to get in touch with [place] if they do not hear back from the sleep lab staff directly within the next 2 weeks.  Thank you very much for allowing me  to participate in the care of this nice patient. If I can be of any further assistance to you please do not hesitate to call me at [phone].  Sincerely,  Star Age, MD, PhD  This was an extended visit of over 60 minutes with extended chart and record review.

## 2022-12-13 NOTE — Patient Instructions (Signed)
It was nice to meet you today. Here is what we discussed today:    Please take your 0.5 mg dose of ropinirole around 6 or 7 PM and take your 1.5 mg dose around 9 PM daily.  Please follow-up with your primary care to investigate causes of iron deficiency and anemia as well as weight loss and appetite loss.  Please continue with your iron supplement and discussed the possibility of getting an iron infusion.  Based on your symptoms and your exam I believe you are at risk for obstructive sleep apnea (aka OSA). We should proceed with a sleep study to determine whether you do or do not have OSA and how severe it is. Even, if you have mild OSA, I may want you to consider treatment with CPAP, as treatment of even borderline or mild sleep apnea can result and improvement of symptoms such as sleep disruption, daytime sleepiness, nighttime bathroom breaks, restless leg symptoms, improvement of headache syndromes, even improved mood disorder.   As explained, an attended sleep study (meaning you get to stay overnight in the sleep lab), lets [place] monitor sleep-related behaviors such as sleep talking and leg movements in sleep, in addition to monitoring for sleep apnea.  A home sleep test is a screening tool for sleep apnea diagnosis only, but unfortunately, does not help with any other sleep-related diagnoses.  Please remember, the long-term risks and ramifications of untreated moderate to severe obstructive sleep apnea may include (but are not limited to): increased risk for cardiovascular disease, including congestive heart failure, stroke, difficult to control hypertension, treatment resistant obesity, arrhythmias, especially irregular heartbeat commonly known as A. Fib. (atrial fibrillation); even type 2 diabetes has been linked to untreated OSA.   Other correlations that untreated obstructive sleep apnea include macular edema which is swelling of the retina in the eyes, droopy eyelid syndrome, and elevated hemoglobin  and hematocrit levels (often referred to as polycythemia).  Sleep apnea can cause disruption of sleep and sleep deprivation in most cases, which, in turn, can cause recurrent headaches, problems with memory, mood, concentration, focus, and vigilance. Most people with untreated sleep apnea report excessive daytime sleepiness, which can affect their ability to drive. Please do not drive or use heavy equipment or machinery, if you feel sleepy! Patients with sleep apnea can also develop difficulty initiating and maintaining sleep (aka insomnia).   Having sleep apnea may increase your risk for other sleep disorders, including involuntary behaviors sleep such as sleep terrors, sleep talking, sleepwalking.    Having sleep apnea can also increase your risk for restless leg syndrome and leg movements at night.   Please note that untreated obstructive sleep apnea may carry additional perioperative morbidity. Patients with significant obstructive sleep apnea (typically, in the moderate to severe degree) should receive, if possible, perioperative PAP (positive airway pressure) therapy and the surgeons and particularly the anesthesiologists should be informed of the diagnosis and the severity of the sleep disordered breathing.   We will call you or email you through [facility] with regards to your test results and plan a follow-up in sleep clinic accordingly. Most likely, you will hear from one of our nurses.   Our sleep lab administrative assistant will call you to schedule your sleep study and give you further instructions, regarding the check in process for the sleep study, arrival time, what to bring, when you can expect to leave after the study, etc., and to answer any other logistical questions you may have. If you don't hear back  from her by about 2 weeks from now, please feel free to call her direct line at [phone] or you can call our general clinic number, or email [place] through My Chart.

## 2022-12-26 DIAGNOSIS — D649 Anemia, unspecified: Secondary | ICD-10-CM | POA: Diagnosis not present

## 2023-01-09 ENCOUNTER — Telehealth: Payer: Self-pay | Admitting: Neurology

## 2023-01-09 NOTE — Telephone Encounter (Signed)
[employer] pending uploaded notes on the portal

## 2023-01-14 DIAGNOSIS — D649 Anemia, unspecified: Secondary | ICD-10-CM | POA: Diagnosis not present

## 2023-01-18 DIAGNOSIS — H2513 Age-related nuclear cataract, bilateral: Secondary | ICD-10-CM | POA: Diagnosis not present

## 2023-01-21 NOTE — Telephone Encounter (Signed)
NPSG- Holli Humbles [id number] (exp. 01/09/23 to 07/10/23)   Patient is scheduled at [facility] for 03/18/23 at 9 pm.  Mailed packet to the patient.

## 2023-01-29 DIAGNOSIS — D229 Melanocytic nevi, unspecified: Secondary | ICD-10-CM | POA: Diagnosis not present

## 2023-01-29 DIAGNOSIS — L814 Other melanin hyperpigmentation: Secondary | ICD-10-CM | POA: Diagnosis not present

## 2023-01-29 DIAGNOSIS — B0229 Other postherpetic nervous system involvement: Secondary | ICD-10-CM | POA: Diagnosis not present

## 2023-01-29 DIAGNOSIS — L299 Pruritus, unspecified: Secondary | ICD-10-CM | POA: Diagnosis not present

## 2023-01-29 DIAGNOSIS — L821 Other seborrheic keratosis: Secondary | ICD-10-CM | POA: Diagnosis not present

## 2023-01-29 DIAGNOSIS — L820 Inflamed seborrheic keratosis: Secondary | ICD-10-CM | POA: Diagnosis not present

## 2023-01-29 DIAGNOSIS — L578 Other skin changes due to chronic exposure to nonionizing radiation: Secondary | ICD-10-CM | POA: Diagnosis not present

## 2023-01-29 DIAGNOSIS — D1801 Hemangioma of skin and subcutaneous tissue: Secondary | ICD-10-CM | POA: Diagnosis not present

## 2023-01-29 DIAGNOSIS — D239 Other benign neoplasm of skin, unspecified: Secondary | ICD-10-CM | POA: Diagnosis not present

## 2023-02-12 DIAGNOSIS — Z7984 Long term (current) use of oral hypoglycemic drugs: Secondary | ICD-10-CM | POA: Diagnosis not present

## 2023-02-12 DIAGNOSIS — E7800 Pure hypercholesterolemia, unspecified: Secondary | ICD-10-CM | POA: Diagnosis not present

## 2023-02-12 DIAGNOSIS — D869 Sarcoidosis, unspecified: Secondary | ICD-10-CM | POA: Diagnosis not present

## 2023-02-12 DIAGNOSIS — R6881 Early satiety: Secondary | ICD-10-CM | POA: Diagnosis not present

## 2023-02-12 DIAGNOSIS — D649 Anemia, unspecified: Secondary | ICD-10-CM | POA: Diagnosis not present

## 2023-02-12 DIAGNOSIS — Z79899 Other long term (current) drug therapy: Secondary | ICD-10-CM | POA: Diagnosis not present

## 2023-02-12 DIAGNOSIS — G4700 Insomnia, unspecified: Secondary | ICD-10-CM | POA: Diagnosis not present

## 2023-02-12 DIAGNOSIS — G2581 Restless legs syndrome: Secondary | ICD-10-CM | POA: Diagnosis not present

## 2023-02-12 DIAGNOSIS — E119 Type 2 diabetes mellitus without complications: Secondary | ICD-10-CM | POA: Diagnosis not present

## 2023-02-12 DIAGNOSIS — D508 Other iron deficiency anemias: Secondary | ICD-10-CM | POA: Diagnosis not present

## 2023-02-12 DIAGNOSIS — I10 Essential (primary) hypertension: Secondary | ICD-10-CM | POA: Diagnosis not present

## 2023-02-12 DIAGNOSIS — K219 Gastro-esophageal reflux disease without esophagitis: Secondary | ICD-10-CM | POA: Diagnosis not present

## 2023-02-27 DIAGNOSIS — R6881 Early satiety: Secondary | ICD-10-CM | POA: Diagnosis not present

## 2023-02-27 DIAGNOSIS — R112 Nausea with vomiting, unspecified: Secondary | ICD-10-CM | POA: Diagnosis not present

## 2023-02-27 DIAGNOSIS — D509 Iron deficiency anemia, unspecified: Secondary | ICD-10-CM | POA: Diagnosis not present

## 2023-03-18 ENCOUNTER — Ambulatory Visit (INDEPENDENT_AMBULATORY_CARE_PROVIDER_SITE_OTHER): Payer: Medicare HMO | Admitting: Neurology

## 2023-03-18 DIAGNOSIS — R0681 Apnea, not elsewhere classified: Secondary | ICD-10-CM

## 2023-03-18 DIAGNOSIS — Z9189 Other specified personal risk factors, not elsewhere classified: Secondary | ICD-10-CM

## 2023-03-18 DIAGNOSIS — G4733 Obstructive sleep apnea (adult) (pediatric): Secondary | ICD-10-CM

## 2023-03-18 DIAGNOSIS — R0683 Snoring: Secondary | ICD-10-CM

## 2023-03-18 DIAGNOSIS — G4719 Other hypersomnia: Secondary | ICD-10-CM

## 2023-03-18 DIAGNOSIS — G2581 Restless legs syndrome: Secondary | ICD-10-CM

## 2023-03-18 DIAGNOSIS — R790 Abnormal level of blood mineral: Secondary | ICD-10-CM

## 2023-03-18 DIAGNOSIS — G479 Sleep disorder, unspecified: Secondary | ICD-10-CM

## 2023-03-18 DIAGNOSIS — E611 Iron deficiency: Secondary | ICD-10-CM

## 2023-03-18 DIAGNOSIS — G4720 Circadian rhythm sleep disorder, unspecified type: Secondary | ICD-10-CM

## 2023-03-18 DIAGNOSIS — R351 Nocturia: Secondary | ICD-10-CM

## 2023-03-19 NOTE — Progress Notes (Unsigned)
Office Visit Note  Patient: Alison [name]             Date of Birth: 01/01/1956           MRN: [id number]             PCP: Richmond Campbell., PA-C Referring: Richmond Campbell., PA-C Visit Date: 04/02/2023 Occupation: @GUAROCC @  Subjective:  Sicca symptoms    History of Present Illness: Alison [name] is a 67 y.o. female with history of sarcoidosis, sjogren's syndrome, osteopenia, and osteoarthritis.  She is not currently taking any immunosuppressive agents.  Patient continues to have chronic sicca symptoms.  She takes pilocarpine 5 mg 1 to 2 tablets daily for symptomatic relief.  She has tried over-the-counter products including Biotene products in the past.  She started using a humidifier in her bedroom throughout the winter months but did not notice any difference in her symptoms.  She has been using lubricating eyedrops for dry eyes which she finds to be helpful.  She continues to see the ophthalmologist on a yearly basis and the dentist every 6 months.  According to the patient she will be scheduling cataract surgery this summer.  She denies any swollen lymph nodes.  She denies any parotid swelling, tenderness, or salivary stones.  Patient states that she has some aching in both hands worsening in the morning but denies any joint swelling.  She denies any new or worsening pulmonary symptoms.  She denies any signs or symptoms of uveitis.   Activities of Daily Living:  Patient reports morning stiffness for 1-2 minutes.   Patient Denies nocturnal pain.  Difficulty dressing/grooming: Denies Difficulty climbing stairs: Denies Difficulty getting out of chair: Denies Difficulty using hands for taps, buttons, cutlery, and/or writing: Denies  Review of Systems  Constitutional:  Positive for fatigue.  HENT:  Positive for mouth dryness. Negative for mouth sores.   Eyes:  Positive for dryness.  Respiratory:  Positive for shortness of breath.   Cardiovascular:  Negative for chest pain and  palpitations.  Gastrointestinal:  Positive for constipation and diarrhea. Negative for blood in stool.  Endocrine: Negative for increased urination.  Genitourinary:  Negative for involuntary urination.  Musculoskeletal:  Positive for joint pain, gait problem, joint pain and morning stiffness. Negative for joint swelling, myalgias, muscle weakness, muscle tenderness and myalgias.  Skin:  Positive for sensitivity to sunlight. Negative for color change, rash and hair loss.  Allergic/Immunologic: Negative for susceptible to infections.  Neurological:  Positive for dizziness. Negative for headaches.  Hematological:  Negative for swollen glands.  Psychiatric/Behavioral:  Positive for sleep disturbance. Negative for depressed mood. The patient is not nervous/anxious.     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   At risk for coronary artery disease 04/25/2011   Endometriosis    Hypertension    Hyperlipidemia    RLS (restless legs syndrome)     Past Medical History:  Diagnosis Date   Anemia    hx of due to heavy menstral cycles   Chest pain    Endometriosis    GERD (gastroesophageal reflux disease)    Headache(784.0)    occassional headaches   Hiatal hernia  Hyperlipidemia    Hypertension    Inflammatory arthritis    Mediastinal adenopathy 02/01/2012   PET HOT, High ACE level, EBUS 02/26/12   RLS (restless legs syndrome)    Sleep apnea    moderate   Vitamin D deficiency     Family History  Problem Relation Age of Onset   Asthma Mother    Diabetes Mother        and sister, MGM   Heart disease Father    Heart attack Father     Hypertension Father    Alzheimer's disease Father    Cancer Maternal Grandfather    Hypertension Paternal Grandmother    Sleep apnea Neg Hx    Past Surgical History:  Procedure Laterality Date   CESAREAN SECTION     ENDOMETRIAL BIOPSY     FINGER SURGERY     x2   TONSILLECTOMY     TUMOR REMOVAL     gastric schwannoma   Social History   Social History Narrative   Not on file   Immunization History  Administered Date(s) Administered   Influenza Split 09/03/2011, 10/03/2012   Influenza, Quadrivalent, Recombinant, Inj, Pf 08/14/2019   Moderna Sars-Covid-2 Vaccination 01/29/2020, 02/26/2020, 09/02/2020   Pneumococcal Polysaccharide-23 01/04/2012   Zoster Recombinat (Shingrix) 06/20/2018, 03/07/2019     Objective: Vital Signs: BP 107/71 (BP Location: Left Arm, Patient Position: Sitting, Cuff Size: Normal)   Pulse 92   Resp 14   Ht 5' 6.5" (1.689 m)   Wt 158 lb (71.7 kg)   BMI 25.12 kg/m    Physical Exam Vitals and nursing note reviewed.  Constitutional:      Appearance: She is well-developed.  HENT:     Head: Normocephalic and atraumatic.  Eyes:     Conjunctiva/sclera: Conjunctivae normal.  Cardiovascular:     Rate and Rhythm: Normal rate and regular rhythm.     Heart sounds: Normal heart sounds.  Pulmonary:     Effort: Pulmonary effort is normal.     Breath sounds: Normal breath sounds.  Abdominal:     General: Bowel sounds are normal.     Palpations: Abdomen is soft.  Musculoskeletal:     Cervical back: Normal range of motion.  Lymphadenopathy:     Cervical: No cervical adenopathy.  Skin:    General: Skin is warm and dry.     Capillary Refill: Capillary refill takes less than 2 seconds.  Neurological:     Mental Status: She is alert and oriented to person, place, and time.  Psychiatric:        Behavior: Behavior normal.      Musculoskeletal Exam: C-spine, thoracic spine, lumbar spine good range of motion.  Shoulder joints, elbow joints, wrist  joints, MCPs, PIPs, DIPs have good range of motion with no synovitis.  Complete fist formation bilaterally.  Hip joints have good range of motion with no groin pain.  Knee joints have good range of motion with no warmth or effusion.  Ankle joints have good range of motion with no tenderness or joint swelling.  CDAI Exam: CDAI Score: -- Patient Global: --; Provider Global: -- Swollen: --; Tender: -- Joint Exam 04/02/2023   No joint exam has been documented for this visit   There is currently no information documented on the homunculus. Go to the Rheumatology activity and complete the homunculus joint exam.  Investigation: No additional findings.  Imaging: Nocturnal polysomnography  Result Date: 03/18/2023 Huston Foley, MD     03/20/2023  5:48 PM Physician  Interpretation:  [facility] POLYSOMNOGRAPHY  INTERPRETATION REPORT STUDY DATE:  03/18/2023  PATIENT NAME:  Juda Lajeunesse        DATE OF BIRTH:  May 12, 1956 PATIENT ID:  [id number]    TYPE OF STUDY:  PSG READING PHYSICIAN: Huston Foley, MD, PhD SCORING TECHNICIAN: Margaretann Loveless, RPSGT Referred by: Dr. Pollyann Savoy History and Indication for Testing: 67 year old female with an underlying medical history of Sjogren's syndrome, sarcoidosis, osteoarthritis, osteopenia, anemia, endometriosis, reflux disease, headaches, hypertension, hyperlipidemia, vitamin D deficiency, and borderline overweight state, who reports a longstanding history of restless leg syndrome. She has been on ropinirole for years. She snores and has been noted to have pauses in her breathing. Her Epworth sleepiness score is 9 out of 24, fatigue severity score is 39 out of 63. Height: 67 in Weight: 163 lb (BMI 25) Neck Size: 0 in MEDICATIONS: Calcium Carbonate, Carboxymethylcellul-Glycerin, Vitamin D, Lunesta, Glucophage, Protonix, Salagen, Requip, Crestor, Diovan TECHNICAL DESCRIPTION: A registered sleep technologist was in attendance for the  duration of the recording.  Data collection, scoring, video monitoring, and reporting were performed in compliance with the AASM Manual for the Scoring of Sleep and Associated Events; (Hypopnea is scored based on the criteria listed in Section VIII D. 1b in the AASM Manual V2.6 using a 4% oxygen desaturation rule or Hypopnea is scored based on the criteria listed in Section VIII D. 1a in the AASM Manual V2.6 using 3% oxygen desaturation and /or arousal rule). SLEEP CONTINUITY AND SLEEP ARCHITECTURE: The patient took ropinirole, but no lunesta prior to study start. Lights-out was at 21:50: and lights-on at  05:20:, with a total recording time of 7 hours, 29 min. Total sleep time ( TST) was 304.5 minutes with a decreased sleep efficiency at 67.8%. There was  20.5% REM sleep. BODY POSITION:  TST was divided  between the following sleep positions: 15.8% supine;  84.2% lateral;  0% prone. Duration of total sleep and percent of total sleep in their respective position is as follows: supine 48 minutes (16%), non-supine 257 minutes (84%); right 00 minutes (0%), left 256 minutes (84%), and prone 00 minutes (0%). Total supine REM sleep time was 00 minutes (0% of total REM sleep).  Sleep latency was increased at 76.5 minutes.  REM sleep latency was normal at 96.0 minutes. Of the total sleep time, the percentage of stage N1 sleep was 5.3%, which is normal, stage N2 sleep was 55%, which is normal, stage N3 sleep was 19.4%, which is normal, and REM sleep was 20.5%, which is normal. Wake after sleep onset (WASO) time accounted for 68 minutes with sleep fragmentation noted, ranging from minimal to moderate (at times). RESPIRATORY MONITORING:  Based on CMS criteria (using a 4% oxygen desaturation rule for scoring hypopneas), there were 3 apneas (2 obstructive; 1 central; 0 mixed), and 75 hypopneas.  Apnea index was 0.6. Hypopnea index was 14.8. The apnea-hypopnea index was 15.4/hour overall (78.8 supine, 12 non-supine; 11.5 REM, 0.0  supine REM).  There were 0 respiratory effort-related arousals (RERAs).  The RERA index was 0 events/h. Total respiratory disturbance index (RDI) was 15.4 events/h. RDI results showed: supine RDI  78.8 /h; non-supine RDI 3.5 /h; REM RDI 11.5 /h, supine REM RDI 0.0 /h. Based on AASM criteria (using a 3% oxygen desaturation and /or arousal rule for scoring hypopneas), there were 3 apneas (2 obstructive; 1 central; 0 mixed), and 75 hypopneas. Apnea index was 0.6. Hypopnea index was 14.8. The apnea-hypopnea index was 15.4 overall (  78.8 supine, 12 non-supine; 11.5 REM, 0.0 supine REM).  There were 0 respiratory effort-related arousals (RERAs).  The RERA index was 0 events/h. Total respiratory disturbance index (RDI) was 15.4 events/h. RDI results showed: supine RDI  78.8 /h; non-supine RDI 3.5 /h; REM RDI 11.5 /h, supine REM RDI 0.0 /h. OXIMETRY: Oxyhemoglobin Saturation Nadir during sleep was at  70%) from a mean of 94%.  Of the Total sleep time (TST)   hypoxemia (=<88%) was present for  6.4 minutes, or 2.1% of total sleep time. LIMB MOVEMENTS: There were 0 periodic limb movements of sleep (0.0/hr), of which 0 (0.0/hr) were associated with an arousal. AROUSAL: There were 47 arousals in total, for an arousal index of 9 arousals/hour.  Of these, 33 were identified as respiratory-related arousals (7 /h), 0 were PLM-related arousals (0 /h), and 23 were non-specific arousals (5 /h). EEG: Review of the EEG showed no abnormal electrical discharges and symmetrical bihemispheric findings.  EKG: The EKG revealed normal sinus rhythm (NSR). The average heart rate during sleep was 77 bpm. AUDIO/VIDEO REVIEW: The audio and video review did not show any abnormal or unusual behaviors, movements, phonations or vocalizations. The patient took 2 restroom breaks. Snoring was noted in the mild range, intermittently. POST-STUDY QUESTIONNAIRE: Post study, the patient indicated, that sleep was the same as usual. IMPRESSION: 1. Moderate  Obstructive Sleep Apnea (OSA) 2. Dysfunctions associated with sleep stages or arousal from sleep RECOMMENDATIONS: 1. This study demonstrates moderate to severe obstructive sleep apnea, with a total AHI of 15.4/hour, REM AHI of 11.5/hour, supine AHI of 78.8/hour and O2 nadir of 70% (during supine NREM sleep). Treatment with a positive airway pressure (PAP) device is recommended.  The patient will be advised to proceed with home autoPAP therapy for now.  A laboratory attended, full night PAP-titration study can be considered down the road, to optimize therapy settings, mask fit, monitoring of tolerance and of proper oxygen saturations. Other treatment options may be limited, and may include (generally speaking) surgical options in selected patients. Please note that untreated obstructive sleep apnea may carry additional perioperative morbidity. Patients with significant obstructive sleep apnea should receive perioperative PAP therapy and the surgeons and particularly the anesthesiologist should be informed of the diagnosis and the severity of the sleep disordered breathing. 2. This study shows some sleep fragmentation but otherwise normal/physiologic sleep stage percentages; these are nonspecific findings and per se do not signify an intrinsic sleep disorder or a cause for the patient's sleep-related symptoms. Causes include (but are not limited to) the first night effect of the sleep study, circadian rhythm disturbances, medication effect or an underlying mood disorder or medical problem. No significant PLMs (periodic limb movements of sleep) were noted during this study. The patient did take her ropinirole prior to study start. RLS and PLMs may improve with OSA treatment. 3. The patient should be cautioned not to drive, work at heights, or operate dangerous or heavy equipment when tired or sleepy. Review and reiteration of good sleep hygiene measures should be pursued with any patient. 4. The patient will be seen in  follow-up in the sleep clinic at [facility] for discussion of the test results, symptom and treatment compliance review, further management strategies, etc. The patient and the referring provider will be notified of the test results.  I certify that I have reviewed the entire raw data recording prior to the issuance of this report in accordance with the Standards of Accreditation of the American Academy of Sleep Medicine (AASM). Saima  Frances Furbish, MD, PhD Medical Director, [facility]) Diplomat, ABPN (Neurology and Sleep)    Technical Report: General Information Name: Mikelle, Myrick BMI: 25.53 Physician: Huston Foley, MD ID: [id number] Height: 67.0 in Technician: Margaretann Loveless, RPSGT Sex: Female Weight: 163.0 lb Record: [id number] Age: 10 [1956/04/27] Date: 03/18/2023   Medical & Medication History   67 year old female with an underlying medical history of Sjogren's syndrome, sarcoidosis, osteoarthritis, osteopenia, anemia, endometriosis, reflux disease, headaches, hypertension, hyperlipidemia, vitamin D deficiency, and borderline overweight state, who reports a longstanding history of restless leg syndrome of at least 25 years. She has been on ropinirole for the past at least 4 years per PCP. She currently takes 0.5 mg at 8 PM and 1.5 mg at bedtime which is around 11 PM she still has residual symptoms and often does not sleep through the night, waking up with restlessness but also other sleep disruption secondary to nocturia which is at least 3-4 times per average night. Of note, she does snore, her husband has noted occasional pauses in her breathing and she has rarely woken herself up with a sense of gasping for air. She has never had a sleep study. Symptoms have become worse over time including her sleep disruption and restless leg symptoms which she describes as a creepy crawly sensation, she does not typically twitch or kick her legs in her sleep but does toss and turn a lot.  Calcium Carbonate, Carboxymethylcellul-Glycerin, Vitamin D, Lunesta, Glucophage, Protonix, Salagen, Requip, Crestor, Diovan  Sleep Disorder    Comments  The patient came into the lab for a PSG. Per the patient she did not take Lunesta. She took both doses of Requip. The patient had two restroom breaks. EKG kept in NSR. Mild snoring. All sleep stages witnessed. Respiratory events scored with a 4% desat. Slept lateral and supine. Respiratory events mostly while supine. AHI 10.2 after 2 hrs TST.   Lights out: 09:50:45 PM Lights on: 05:20:10 AM Time Total Supine Side Prone Upright Recording (TRT) 7h 29.68m 2h 26.4m 5h 3.30m 0h 0.89m 0h 0.40m Sleep (TST) 5h 4.78m 0h 48.51m 4h 16.75m 0h 0.95m 0h 0.19m Latency N1 N2 N3 REM Onset Per. Slp. Eff. Actual 0h 0.47m 0h 3.91m 0h 18.66m 1h 36.74m 1h 16.3m 1h 31.82m 67.82% Stg Dur Wake N1 N2 N3 REM Total 144.5 16.0 167.0 59.0 62.5 Supine 98.0 8.0 40.0 0.0 0.0 Side 46.5 8.0 127.0 59.0 62.5 Prone 0.0 0.0 0.0 0.0 0.0 Upright 0.0 0.0 0.0 0.0 0.0  Stg % Wake N1 N2 N3 REM Total 32.2 5.3 54.8 19.4 20.5 Supine 21.8 2.6 13.1 0.0 0.0 Side 10.4 2.6 41.7 19.4 20.5 Prone 0.0 0.0 0.0 0.0 0.0 Upright 0.0 0.0 0.0 0.0 0.0  Apnea Summary Sub Supine Side Prone Upright Total 3 Total 3 1 2  0 0   REM 1 0 1 0 0   NREM 2 1 1  0 0 Obs 2 REM 0 0 0 0 0   NREM 2 1 1  0 0 Mix 0 REM 0 0 0 0 0   NREM 0 0 0 0 0 Cen 1 REM 1 0 1 0 0   NREM 0 0 0 0 0 Rera Summary Sub Supine Side Prone Upright Total 0 Total 0 0 0 0 0   REM 0 0 0 0 0   NREM 0 0 0 0 0  Hypopnea Summary Sub Supine Side Prone Upright Total 75 Total 75 62 13 0 0   REM 11 0 11 0 0  NREM 64 62 2 0 0 4% Hypopnea Summary Sub Supine Side Prone Upright Total (4%) 75 Total 75 62 13 0 0   REM 11 0 11 0 0   NREM 64 62 2 0 0  AHI Total Obs Mix Cen 15.37 Apnea 0.59 0.39 0.00 0.20  Hypopnea 14.78 -- -- -- 15.37 Hypopnea (4%) 14.78 -- -- --  Total Supine Side Prone Upright Position AHI 15.37 78.75 3.51 0.00 0.00 REM AHI 11.52  NREM AHI 16.36  Position RDI 15.37 78.75 3.51 0.00 0.00  REM RDI 11.52  NREM RDI 16.36  4% Hypopnea Total Supine Side Prone Upright Position AHI (4%) 15.37 78.75 3.51 0.00 0.00 REM AHI (4%) 11.52  NREM AHI (4%) 16.36  Position RDI (4%) 15.37 78.75 3.51 0.00 0.00 REM RDI (4%) 11.52  NREM RDI (4%) 16.36  Desaturation Information Threshold: 2% <100% <90% <80% <70% <60% <50% <40% Supine 141.0 58.0 1.0 0.0 0.0 0.0 0.0 Side 109.0 7.0 0.0 0.0 0.0 0.0 0.0 Prone 0.0 0.0 0.0 0.0 0.0 0.0 0.0 Upright 0.0 0.0 0.0 0.0 0.0 0.0 0.0 Total 250.0 65.0 1.0 0.0 0.0 0.0 0.0 Index 40.3 10.5 0.2 0.0 0.0 0.0 0.0 Threshold: 3% <100% <90% <80% <70% <60% <50% <40% Supine 111.0 58.0 1.0 0.0 0.0 0.0 0.0 Side 25.0 7.0 0.0 0.0 0.0 0.0 0.0 Prone 0.0 0.0 0.0 0.0 0.0 0.0 0.0 Upright 0.0 0.0 0.0 0.0 0.0 0.0 0.0 Total 136.0 65.0 1.0 0.0 0.0 0.0 0.0 Index 21.9 10.5 0.2 0.0 0.0 0.0 0.0 Threshold: 4% <100% <90% <80% <70% <60% <50% <40% Supine 94.0 58.0 1.0 0.0 0.0 0.0 0.0 Side 21.0 7.0 0.0 0.0 0.0 0.0 0.0 Prone 0.0 0.0 0.0 0.0 0.0 0.0 0.0 Upright 0.0 0.0 0.0 0.0 0.0 0.0 0.0 Total 115.0 65.0 1.0 0.0 0.0 0.0 0.0 Index 18.5 10.5 0.2 0.0 0.0 0.0 0.0 Threshold: 3% <100% <90% <80% <70% <60% <50% <40% Supine 111 58 1 0 0 0 0 Side 25 7 0 0 0 0 0 Prone 0 0 0 0 0 0 0 Upright 0 0 0 0 0 0 0 Total 136 65 1 0 0 0 0  Awakening/Arousal Information # of Awakenings 18 Wake after sleep onset 68.50m Wake after persistent sleep 62.58m Arousal Assoc. Arousals Index Apneas 1 0.2 Hypopneas 32 6.3 Leg Movements 0 0.0 Snore 0 0.0 PTT Arousals 0 0.0 Spontaneous 24 4.7 Total 57 11.2 Leg Movement Information PLMS LMs Index Total LMs during PLMS 0 0.0 LMs w/ Microarousals 0 0.0 LM LMs Index w/ Microarousal 0 0.0 w/ Awakening 0 0.0 w/ Resp Event 0 0.0 Spontaneous 0 0.0 Total 0 0.0  Desaturation threshold setting: 3% Minimum desaturation setting: 10 seconds SaO2 nadir: 70% The longest event was a 107 sec obstructive Hypopnea with a minimum SaO2 of 89%. The lowest SaO2 was 81% associated with a 30 sec obstructive Hypopnea. EKG Rates EKG Avg Max  Min Awake 88 106 71 Asleep 77 91 62 EKG Events: Tachycardia    Recent Labs: Lab Results  Component Value Date   WBC 8.6 10/30/2022   HGB 11.3 (L) 10/30/2022   PLT 421 (H) 10/30/2022   NA 137 10/30/2022   K 4.2 10/30/2022   CL 99 10/30/2022   CO2 27 10/30/2022   GLUCOSE 88 10/30/2022   BUN 8 10/30/2022   CREATININE 0.65 10/30/2022   BILITOT 0.7 10/30/2022   ALKPHOS 100 05/31/2017   AST 25 10/30/2022   ALT 26 10/30/2022   PROT 7.2 10/30/2022   PROT 7.0 10/30/2022  ALBUMIN 4.2 05/31/2017   CALCIUM 10.0 10/30/2022   GFRAA 108 06/25/2019    Speciality Comments: No specialty comments available.  Procedures:  No procedures performed Allergies: Patient has no known allergies.   Assessment / Plan:     Visit Diagnoses: Sarcoidosis, lung, stage I - +ACE, hilar lymphadenopathy Chest x-ray 2019 was negative: No recurrence.  Chest x-ray did not reveal any active cardiopulmonary disease on 10/13/2021.  No new or worsening pulmonary symptoms.  She is not currently taking immunosuppressive agents. Signs or symptoms of uveitis.  She is followed closely by ophthalmology.  Hilar lymphadenopathy  High risk medication use - She is not currently taking any immunosuppressive agents.  Discontinued Methotrexate 3 tablets po once weekly and folic acid in February 2020 due to elevated LFTs.   Primary osteoarthritis of both hands: She experiences some aching and stiffness in her hands first thing in the mornings but has not had any inflammation.  On examination today no tenderness or synovitis was noted.  Complete fist formation noted bilaterally.  Sjogren's syndrome with other organ involvement (HCC) - Positive ANA and +Ro:  She continues to have chronic sicca symptoms.  She has been using a humidifier in her bedroom through the winter months but has not noticed any difference in her symptoms.  She continues to take pilocarpine 5 mg 1 to 2 tablets daily and uses lubricating eyedrops for symptomatic  relief.  She has tried over-the-counter products for mouth dryness with minimal relief.  She tries to drink water throughout the day for adequate hydration.  She has been seeing ophthalmology on a yearly basis and the dentist every 6 months.  Discussed that she can see the dentist for routine cleanings quarterly if coded for the diagnosis of Sjogren's disease.  She plans on further discussing with her dentist. She has not had any new or worsening pulmonary symptoms.  Her lungs were clear to auscultation today.  She is aware of the increased risk for developing ILD in patients with Sjogren's syndrome. Discussed the increased risk for developing lymphoma in patients with Sjogren's syndrome.  SPEP was within normal limits on 10/30/2022. Lab work from 10/30/2022 was reviewed today in the office: UA normal, CMP within normal limits, double-stranded ENA negative, complements within normal limits, ESR within normal limits, Ro antibody negative, SPEP did not reveal any abnormal protein bands.  The following lab work will be obtained today for further evaluation.  She does not require immunosuppressive therapy at this time.  She was advised to notify us if she develops any new or worsening symptoms.  She will follow-up in the office in 5 months or sooner if needed. - Plan: Protein / creatinine ratio, urine, C3 and C4, Sedimentation rate, ANA, VITAMIN D 25 Hydroxy (Vit-D Deficiency, Fractures), Serum protein electrophoresis with reflex, Sjogrens syndrome-A extractable nuclear antibody, CBC with Differential/Platelet, COMPLETE METABOLIC PANEL WITH GFR  Osteopenia of multiple sites - Previously tolerated Fosamax from Nov 2017- July 2019. DEXA 05/04/2021: AP spine -2.3, BMD 0.799% -4% change.calcium and vitamin D.not taking any bisphosphonates.  Due to update DEXA in June 2024.  Future order was placed today.- Plan: DG BONE DENSITY (DXA)  Vitamin D deficiency - Vitamin D level will be checked today. Plan: VITAMIN D 25  Hydroxy (Vit-D Deficiency, Fractures)  Other medical conditions are listed as follows:  History of hyperlipidemia  History of hypertension: Blood pressure was 107/71 today in the office.  RLS (restless legs syndrome) - Referred to neurology  Primary insomnia  Low iron -  iron was low 27 on 10/30/2022.  Patient recently underwent an endoscopy and colonoscopy by Dr. Elnoria Howard to evaluate for cause of anemia.  According to the patient the workup was unremarkable.  Iron panel be rechecked today.  Patient requested to have results forwarded to her PCP.  Plan: Fe+TIBC+Fer, CBC with Differential/Platelet  Orders: Orders Placed This Encounter  Procedures   DG BONE DENSITY (DXA)   Protein / creatinine ratio, urine   C3 and C4   Sedimentation rate   ANA   VITAMIN D 25 Hydroxy (Vit-D Deficiency, Fractures)   Fe+TIBC+Fer   Serum protein electrophoresis with reflex   Sjogrens syndrome-A extractable nuclear antibody   CBC with Differential/Platelet   COMPLETE METABOLIC PANEL WITH GFR   No orders of the defined types were placed in this encounter.    Follow-Up Instructions: Return in about 5 months (around 09/02/2023) for Sjogren's syndrome, Sarcoidosis.   Gearldine Bienenstock, PA-C  Note - This record has been created using Dragon software.  Chart creation errors have been sought, but may not always  have been located. Such creation errors do not reflect on  the standard of medical care.

## 2023-03-20 NOTE — Addendum Note (Signed)
Addended by: Huston Foley on: 03/20/2023 05:51 PM   Modules accepted: Orders

## 2023-03-20 NOTE — Procedures (Signed)
Physician Interpretation:     [facility] POLYSOMNOGRAPHY  INTERPRETATION REPORT   STUDY DATE:  03/18/2023     PATIENT NAME:  Alison [name]         DATE OF BIRTH:  [birth year]  PATIENT ID:  [id number]    TYPE OF STUDY:  PSG  READING PHYSICIAN: Huston Foley, MD, PhD SCORING TECHNICIAN: Margaretann Loveless, RPSGT   Referred by: Dr. Pollyann Savoy  History and Indication for Testing: 67 year old female with an underlying medical history of Sjogren's syndrome, sarcoidosis, osteoarthritis, osteopenia, anemia, endometriosis, reflux disease, headaches, hypertension, hyperlipidemia, vitamin D deficiency, and borderline overweight state, who reports a longstanding history of restless leg syndrome. She has been on ropinirole for years. She snores and has been noted to have pauses in her breathing. Her Epworth sleepiness score is 9 out of 24, fatigue severity score is 39 out of 63. Height: 67 in Weight: 163 lb (BMI 25) Neck Size: 0 in   MEDICATIONS: Calcium Carbonate, Carboxymethylcellul-Glycerin, Vitamin D, Lunesta, Glucophage, Protonix, Salagen, Requip, Crestor, Diovan  TECHNICAL DESCRIPTION: A registered sleep technologist was in attendance for the duration of the recording.  Data collection, scoring, video monitoring, and reporting were performed in compliance with the AASM Manual for the Scoring of Sleep and Associated Events; (Hypopnea is scored based on the criteria listed in Section VIII D. 1b in the AASM Manual V2.6 using a 4% oxygen desaturation rule or Hypopnea is scored based on the criteria listed in Section VIII D. 1a in the AASM Manual V2.6 using 3% oxygen desaturation and /or arousal rule).   SLEEP CONTINUITY AND SLEEP ARCHITECTURE: The patient took ropinirole, but no lunesta prior to study start. Lights-out was at 21:50: and lights-on at  05:20:, with a total recording time of 7 hours, 29 min. Total sleep time ( TST) was 304.5 minutes with a decreased sleep  efficiency at 67.8%. There was  20.5% REM sleep.   BODY POSITION:  TST was divided  between the following sleep positions: 15.8% supine;  84.2% lateral;  0% prone. Duration of total sleep and percent of total sleep in their respective position is as follows: supine 48 minutes (16%), non-supine 257 minutes (84%); right 00 minutes (0%), left 256 minutes (84%), and prone 00 minutes (0%).  Total supine REM sleep time was 00 minutes (0% of total REM sleep).  Sleep latency was increased at 76.5 minutes.  REM sleep latency was normal at 96.0 minutes. Of the total sleep time, the percentage of stage N1 sleep was 5.3%, which is normal, stage N2 sleep was 55%, which is normal, stage N3 sleep was 19.4%, which is normal, and REM sleep was 20.5%, which is normal. Wake after sleep onset (WASO) time accounted for 68 minutes with sleep fragmentation noted, ranging from minimal to moderate (at times).   RESPIRATORY MONITORING:   Based on CMS criteria (using a 4% oxygen desaturation rule for scoring hypopneas), there were 3 apneas (2 obstructive; 1 central; 0 mixed), and 75 hypopneas.  Apnea index was 0.6. Hypopnea index was 14.8. The apnea-hypopnea index was 15.4/hour overall (78.8 supine, 12 non-supine; 11.5 REM, 0.0 supine REM).  There were 0 respiratory effort-related arousals (RERAs).  The RERA index was 0 events/h. Total respiratory disturbance index (RDI) was 15.4 events/h. RDI results showed: supine RDI  78.8 /h; non-supine RDI 3.5 /h; REM RDI 11.5 /h, supine REM RDI 0.0 /h.   Based on AASM criteria (using a 3% oxygen desaturation and /or arousal rule for scoring hypopneas),  there were 3 apneas (2 obstructive; 1 central; 0 mixed), and 75 hypopneas. Apnea index was 0.6. Hypopnea index was 14.8. The apnea-hypopnea index was 15.4 overall (78.8 supine, 12 non-supine; 11.5 REM, 0.0 supine REM).  There were 0 respiratory effort-related arousals (RERAs).  The RERA index was 0 events/h. Total respiratory disturbance index  (RDI) was 15.4 events/h. RDI results showed: supine RDI  78.8 /h; non-supine RDI 3.5 /h; REM RDI 11.5 /h, supine REM RDI 0.0 /h.   OXIMETRY: Oxyhemoglobin Saturation Nadir during sleep was at  70%) from a mean of 94%.  Of the Total sleep time (TST)   hypoxemia (=<88%) was present for  6.4 minutes, or 2.1% of total sleep time.   LIMB MOVEMENTS: There were 0 periodic limb movements of sleep (0.0/hr), of which 0 (0.0/hr) were associated with an arousal.  AROUSAL: There were 47 arousals in total, for an arousal index of 9 arousals/hour.  Of these, 33 were identified as respiratory-related arousals (7 /h), 0 were PLM-related arousals (0 /h), and 23 were non-specific arousals (5 /h).  EEG: Review of the EEG showed no abnormal electrical discharges and symmetrical bihemispheric findings.    EKG: The EKG revealed normal sinus rhythm (NSR). The average heart rate during sleep was 77 bpm.  AUDIO/VIDEO REVIEW: The audio and video review did not show any abnormal or unusual behaviors, movements, phonations or vocalizations. The patient took 2 restroom breaks. Snoring was noted in the mild range, intermittently.  POST-STUDY QUESTIONNAIRE: Post study, the patient indicated, that sleep was the same as usual.   IMPRESSION:  1. Moderate Obstructive Sleep Apnea (OSA) 2. Dysfunctions associated with sleep stages or arousal from sleep  RECOMMENDATIONS:  1. This study demonstrates moderate to severe obstructive sleep apnea, with a total AHI of 15.4/hour, REM AHI of 11.5/hour, supine AHI of 78.8/hour and O2 nadir of 70% (during supine NREM sleep). Treatment with a positive airway pressure (PAP) device is recommended.  The patient will be advised to proceed with home autoPAP therapy for now.  A laboratory attended, full night PAP-titration study can be considered down the road, to optimize therapy settings, mask fit, monitoring of tolerance and of proper oxygen saturations. Other treatment options may be limited, and  may include (generally speaking) surgical options in selected patients. Please note that untreated obstructive sleep apnea may carry additional perioperative morbidity. Patients with significant obstructive sleep apnea should receive perioperative PAP therapy and the surgeons and particularly the anesthesiologist should be informed of the diagnosis and the severity of the sleep disordered breathing. 2. This study shows some sleep fragmentation but otherwise normal/physiologic sleep stage percentages; these are nonspecific findings and per se do not signify an intrinsic sleep disorder or a cause for the patient's sleep-related symptoms. Causes include (but are not limited to) the first night effect of the sleep study, circadian rhythm disturbances, medication effect or an underlying mood disorder or medical problem. No significant PLMs (periodic limb movements of sleep) were noted during this study. The patient did take her ropinirole prior to study start. RLS and PLMs may improve with OSA treatment.  3. The patient should be cautioned not to drive, work at heights, or operate dangerous or heavy equipment when tired or sleepy. Review and reiteration of good sleep hygiene measures should be pursued with any patient. 4. The patient will be seen in follow-up in the sleep clinic at [facility] for discussion of the test results, symptom and treatment compliance review, further management strategies, etc. The patient and the referring  provider will be notified of the test results.   I certify that I have reviewed the entire raw data recording prior to the issuance of this report in accordance with the Standards of Accreditation of the American Academy of Sleep Medicine (AASM).  Huston Foley, MD, PhD Medical Director, [facility]) Diplomat, ABPN (Neurology and Sleep)               Technical Report:   General Information  Name: Chinara, Hertzberg BMI: 25.53 Physician:  Huston Foley, MD  ID: [id number] Height: 67.0 in Technician: Margaretann Loveless, RPSGT  Sex: Female Weight: 163.0 lb Record: [id number]  Age: 59 [Oct 04, 1956] Date: 03/18/2023    Medical & Medication History    67 year old female with an underlying medical history of Sjogren's syndrome, sarcoidosis, osteoarthritis, osteopenia, anemia, endometriosis, reflux disease, headaches, hypertension, hyperlipidemia, vitamin D deficiency, and borderline overweight state, who reports a longstanding history of restless leg syndrome of at least 25 years. She has been on ropinirole for the past at least 4 years per PCP. She currently takes 0.5 mg at 8 PM and 1.5 mg at bedtime which is around 11 PM she still has residual symptoms and often does not sleep through the night, waking up with restlessness but also other sleep disruption secondary to nocturia which is at least 3-4 times per average night. Of note, she does snore, her husband has noted occasional pauses in her breathing and she has rarely woken herself up with a sense of gasping for air. She has never had a sleep study. Symptoms have become worse over time including her sleep disruption and restless leg symptoms which she describes as a creepy crawly sensation, she does not typically twitch or kick her legs in her sleep but does toss and turn a lot.  Calcium Carbonate, Carboxymethylcellul-Glycerin, Vitamin D, Lunesta, Glucophage, Protonix, Salagen, Requip, Crestor, Diovan   Sleep Disorder      Comments   The patient came into the lab for a PSG. Per the patient she did not take Lunesta. She took both doses of Requip. The patient had two restroom breaks. EKG kept in NSR. Mild snoring. All sleep stages witnessed. Respiratory events scored with a 4% desat. Slept lateral and supine. Respiratory events mostly while supine. AHI 10.2 after 2 hrs TST.     Lights out: 09:50:45 PM Lights on: 05:20:10 AM   Time Total Supine Side Prone Upright  Recording (TRT) 7h 29.34m 2h  26.75m 5h 3.65m 0h 0.38m 0h 0.75m  Sleep (TST) 5h 4.42m 0h 48.13m 4h 16.63m 0h 0.86m 0h 0.52m   Latency N1 N2 N3 REM Onset Per. Slp. Eff.  Actual 0h 0.30m 0h 3.34m 0h 18.11m 1h 36.72m 1h 16.66m 1h 31.37m 67.82%   Stg Dur Wake N1 N2 N3 REM  Total 144.5 16.0 167.0 59.0 62.5  Supine 98.0 8.0 40.0 0.0 0.0  Side 46.5 8.0 127.0 59.0 62.5  Prone 0.0 0.0 0.0 0.0 0.0  Upright 0.0 0.0 0.0 0.0 0.0   Stg % Wake N1 N2 N3 REM  Total 32.2 5.3 54.8 19.4 20.5  Supine 21.8 2.6 13.1 0.0 0.0  Side 10.4 2.6 41.7 19.4 20.5  Prone 0.0 0.0 0.0 0.0 0.0  Upright 0.0 0.0 0.0 0.0 0.0     Apnea Summary Sub Supine Side Prone Upright  Total 3 Total 3 1 2  0 0    REM 1 0 1 0 0    NREM 2 1 1  0 0  Obs 2 REM 0  0 0 0 0    NREM 0 0  Mix 0 REM 0 0 0 0 0    NREM 0 0 0 0 0  Cen 1 REM 1 0 1 0 0    NREM 0 0 0 0 0   Rera Summary Sub Supine Side Prone Upright  Total 0 Total 0 0 0 0 0    REM 0 0 0 0 0    NREM 0 0 0 0 0   Hypopnea Summary Sub Supine Side Prone Upright  Total 75 Total 75 62 13 0 0    REM 11 0 11 0 0    NREM 64 62 2 0 0   4% Hypopnea Summary Sub Supine Side Prone Upright  Total (4%) 75 Total 75 62 13 0 0    REM 11 0 11 0 0    NREM 64 62 2 0 0     AHI Total Obs Mix Cen  15.37 Apnea 0.59 0.39 0.00 0.20   Hypopnea 14.78 -- -- --  15.37 Hypopnea (4%) 14.78 -- -- --    Total Supine Side Prone Upright  Position AHI 15.37 78.75 3.51 0.00 0.00  REM AHI 11.52   NREM AHI 16.36   Position RDI 15.37 78.75 3.51 0.00 0.00  REM RDI 11.52   NREM RDI 16.36    4% Hypopnea Total Supine Side Prone Upright  Position AHI (4%) 15.37 78.75 3.51 0.00 0.00  REM AHI (4%) 11.52   NREM AHI (4%) 16.36   Position RDI (4%) 15.37 78.75 3.51 0.00 0.00  REM RDI (4%) 11.52   NREM RDI (4%) 16.36    Desaturation Information Threshold: 2% <100% <90% <80% <70% <60% <50% <40%  Supine 141.0 58.0 1.0 0.0 0.0 0.0 0.0  Side 109.0 7.0 0.0 0.0 0.0 0.0 0.0  Prone 0.0 0.0 0.0 0.0 0.0 0.0 0.0  Upright 0.0 0.0 0.0 0.0 0.0 0.0 0.0   Total 250.0 65.0 1.0 0.0 0.0 0.0 0.0  Index 40.3 10.5 0.2 0.0 0.0 0.0 0.0   Threshold: 3% <100% <90% <80% <70% <60% <50% <40%  Supine 111.0 58.0 1.0 0.0 0.0 0.0 0.0  Side 25.0 7.0 0.0 0.0 0.0 0.0 0.0  Prone 0.0 0.0 0.0 0.0 0.0 0.0 0.0  Upright 0.0 0.0 0.0 0.0 0.0 0.0 0.0  Total 136.0 65.0 1.0 0.0 0.0 0.0 0.0  Index 21.9 10.5 0.2 0.0 0.0 0.0 0.0   Threshold: 4% <100% <90% <80% <70% <60% <50% <40%  Supine 94.0 58.0 1.0 0.0 0.0 0.0 0.0  Side 21.0 7.0 0.0 0.0 0.0 0.0 0.0  Prone 0.0 0.0 0.0 0.0 0.0 0.0 0.0  Upright 0.0 0.0 0.0 0.0 0.0 0.0 0.0  Total 115.0 65.0 1.0 0.0 0.0 0.0 0.0  Index 18.5 10.5 0.2 0.0 0.0 0.0 0.0   Threshold: 3% <100% <90% <80% <70% <60% <50% <40%  Supine 111 58 1 0 0 0 0  Side 25 7 0 0 0 0 0  Prone 0 0 0 0 0 0 0  Upright 0 0 0 0 0 0 0  Total 136 65 1 0 0 0 0   Awakening/Arousal Information # of Awakenings 18  Wake after sleep onset 68.31m  Wake after persistent sleep 62.49m   Arousal Assoc. Arousals Index  Apneas 1 0.2  Hypopneas 32 6.3  Leg Movements 0 0.0  Snore 0 0.0  PTT Arousals 0 0.0  Spontaneous 24 4.7  Total 57 11.2  Leg Movement Information PLMS  LMs Index  Total LMs during PLMS 0 0.0  LMs w/ Microarousals 0 0.0   LM LMs Index  w/ Microarousal 0 0.0  w/ Awakening 0 0.0  w/ Resp Event 0 0.0  Spontaneous 0 0.0  Total 0 0.0     Desaturation threshold setting: 3% Minimum desaturation setting: 10 seconds SaO2 nadir: 70% The longest event was a 107 sec obstructive Hypopnea with a minimum SaO2 of 89%. The lowest SaO2 was 81% associated with a 30 sec obstructive Hypopnea. EKG Rates EKG Avg Max Min  Awake 88 106 71  Asleep 77 91 62  EKG Events: Tachycardia

## 2023-03-21 DIAGNOSIS — K449 Diaphragmatic hernia without obstruction or gangrene: Secondary | ICD-10-CM | POA: Diagnosis not present

## 2023-03-21 DIAGNOSIS — D509 Iron deficiency anemia, unspecified: Secondary | ICD-10-CM | POA: Diagnosis not present

## 2023-03-21 DIAGNOSIS — D131 Benign neoplasm of stomach: Secondary | ICD-10-CM | POA: Diagnosis not present

## 2023-03-21 DIAGNOSIS — K5730 Diverticulosis of large intestine without perforation or abscess without bleeding: Secondary | ICD-10-CM | POA: Diagnosis not present

## 2023-03-21 DIAGNOSIS — K31A14 Gastric intestinal metaplasia without dysplasia, involving the cardia: Secondary | ICD-10-CM | POA: Diagnosis not present

## 2023-03-21 DIAGNOSIS — K317 Polyp of stomach and duodenum: Secondary | ICD-10-CM | POA: Diagnosis not present

## 2023-03-27 ENCOUNTER — Telehealth: Payer: Self-pay | Admitting: *Deleted

## 2023-03-27 NOTE — Telephone Encounter (Signed)
Pt returned call.  I gave her results of her the sleep study with moderate OSA.  Relayed results below.  She verbalized understanding.  DME [facility] , we will see back in 10 wks for follow up.  Phone room to call and set up appt.

## 2023-03-27 NOTE — Telephone Encounter (Signed)
-----   Message from Huston Foley, MD sent at 03/20/2023  5:51 PM EDT ----- Patient referred by her rheumatologist, seen by me on 12/13/22, diagnostic PSG on 03/18/23.    Please call and notify the patient that the recent sleep study showed moderate obstructive sleep apnea. I recommend treatment for in the form of autoPAP. We may consider at a CPAP titration study at a later date, if need be, which means, that we would ask her to come back in for a second sleep study with CPAP treatment. For now, I would like to start her on a so-called autoPAP machine at home, through a DME company (of her choice, or as per insurance requirement). The DME representative will educate her on how to use the machine, how to put the mask on, etc. I have placed an order in the chart. Please send referral, talk to patient, send report to referring MD. We will need a FU in sleep clinic for 10 weeks post-PAP set up, please arrange that with me or one of our NPs. Thanks,   Of note, she slept fairly well otherwise, achieved all stages of sleep and had no significant leg movements or leg twitching (she has a Hx of RLS).  Huston Foley, MD, PhD [facility])

## 2023-03-27 NOTE — Telephone Encounter (Signed)
Called pt LVM to please call office to schedule follow-up appointment.

## 2023-04-02 ENCOUNTER — Encounter: Payer: Self-pay | Admitting: Physician Assistant

## 2023-04-02 ENCOUNTER — Ambulatory Visit: Payer: Medicare HMO | Attending: Physician Assistant | Admitting: Physician Assistant

## 2023-04-02 VITALS — BP 107/71 | HR 92 | Resp 14 | Ht 66.5 in | Wt 158.0 lb

## 2023-04-02 DIAGNOSIS — Z8679 Personal history of other diseases of the circulatory system: Secondary | ICD-10-CM

## 2023-04-02 DIAGNOSIS — D860 Sarcoidosis of lung: Secondary | ICD-10-CM | POA: Diagnosis not present

## 2023-04-02 DIAGNOSIS — G2581 Restless legs syndrome: Secondary | ICD-10-CM | POA: Diagnosis not present

## 2023-04-02 DIAGNOSIS — F5101 Primary insomnia: Secondary | ICD-10-CM | POA: Diagnosis not present

## 2023-04-02 DIAGNOSIS — M19041 Primary osteoarthritis, right hand: Secondary | ICD-10-CM | POA: Diagnosis not present

## 2023-04-02 DIAGNOSIS — E611 Iron deficiency: Secondary | ICD-10-CM

## 2023-04-02 DIAGNOSIS — M3509 Sicca syndrome with other organ involvement: Secondary | ICD-10-CM

## 2023-04-02 DIAGNOSIS — E559 Vitamin D deficiency, unspecified: Secondary | ICD-10-CM | POA: Diagnosis not present

## 2023-04-02 DIAGNOSIS — M8589 Other specified disorders of bone density and structure, multiple sites: Secondary | ICD-10-CM

## 2023-04-02 DIAGNOSIS — Z79899 Other long term (current) drug therapy: Secondary | ICD-10-CM | POA: Diagnosis not present

## 2023-04-02 DIAGNOSIS — R590 Localized enlarged lymph nodes: Secondary | ICD-10-CM | POA: Diagnosis not present

## 2023-04-02 DIAGNOSIS — Z8639 Personal history of other endocrine, nutritional and metabolic disease: Secondary | ICD-10-CM | POA: Diagnosis not present

## 2023-04-02 DIAGNOSIS — M19042 Primary osteoarthritis, left hand: Secondary | ICD-10-CM

## 2023-04-02 LAB — CBC WITH DIFFERENTIAL/PLATELET
Absolute Monocytes: 670 cells/uL (ref 200–950)
HCT: 36.7 % (ref 35.0–45.0)
MCH: 25.3 pg — ABNORMAL LOW (ref 27.0–33.0)
Neutro Abs: 5368 cells/uL (ref 1500–7800)
RBC: 4.59 10*6/uL (ref 3.80–5.10)
RDW: 15.2 % — ABNORMAL HIGH (ref 11.0–15.0)

## 2023-04-03 LAB — PROTEIN ELECTROPHORESIS, SERUM, WITH REFLEX: Total Protein: 6.7 g/dL (ref 6.1–8.1)

## 2023-04-03 LAB — CBC WITH DIFFERENTIAL/PLATELET
Basophils Relative: 0.6 %
Hemoglobin: 11.6 g/dL — ABNORMAL LOW (ref 11.7–15.5)
Lymphs Abs: 2288 cells/uL (ref 850–3900)
Platelets: 371 10*3/uL (ref 140–400)

## 2023-04-03 LAB — COMPLETE METABOLIC PANEL WITH GFR
Albumin: 4.1 g/dL (ref 3.6–5.1)
Alkaline phosphatase (APISO): 121 U/L (ref 37–153)
Calcium: 9.9 mg/dL (ref 8.6–10.4)
Potassium: 3.7 mmol/L (ref 3.5–5.3)
Sodium: 136 mmol/L (ref 135–146)
Total Protein: 6.6 g/dL (ref 6.1–8.1)

## 2023-04-03 LAB — IRON,TIBC AND FERRITIN PANEL
%SAT: 8 % (calc) — ABNORMAL LOW (ref 16–45)
Ferritin: 15 ng/mL — ABNORMAL LOW (ref 16–288)
Iron: 33 ug/dL — ABNORMAL LOW (ref 45–160)

## 2023-04-03 LAB — PROTEIN / CREATININE RATIO, URINE: Protein/Creat Ratio: 62 mg/g creat (ref 24–184)

## 2023-04-03 NOTE — Progress Notes (Signed)
No proteinuria.   Iron remains low but has improved.  Ferritin is borderline low but has improved. Please forward results to PCP as requested.   Hemoglobin remains low but has improved. Rest of CBC stable.  Glucose is 116. ALT is borderline elevated-37. Avoid the use of tylenol and alcohol.  ESR WNL.  Vitamin D WNL. Continue maintenance dose of vitamin D.

## 2023-04-04 LAB — COMPLETE METABOLIC PANEL WITH GFR
ALT: 37 U/L — ABNORMAL HIGH (ref 6–29)
AST: 30 U/L (ref 10–35)
BUN/Creatinine Ratio: 11 (calc) (ref 6–22)
CO2: 27 mmol/L (ref 20–32)
Creat: 0.56 mg/dL (ref 0.50–1.05)
Glucose, Bld: 116 mg/dL — ABNORMAL HIGH (ref 65–99)
eGFR: 100 mL/min/{1.73_m2} (ref >=60)

## 2023-04-04 LAB — CBC WITH DIFFERENTIAL/PLATELET
MCHC: 31.6 g/dL — ABNORMAL LOW (ref 32.0–36.0)
MCV: 80 fL (ref 80.0–100.0)
MPV: 11.7 fL (ref 7.5–12.5)
Neutrophils Relative %: 61.7 %

## 2023-04-04 LAB — ANA: Anti Nuclear Antibody (ANA): POSITIVE — AB

## 2023-04-04 LAB — SJOGRENS SYNDROME-A EXTRACTABLE NUCLEAR ANTIBODY: SSA (Ro) (ENA) Antibody, IgG: <1 AI

## 2023-04-04 LAB — ANTI-NUCLEAR AB-TITER (ANA TITER): ANA Titer 1: 1:320 {titer} — ABNORMAL HIGH

## 2023-04-04 LAB — PROTEIN / CREATININE RATIO, URINE: Protein/Creatinine Ratio: 0.062 mg/mg creat (ref 0.024–0.184)

## 2023-04-04 LAB — C3 AND C4: C4 Complement: 28 mg/dL (ref 15–57)

## 2023-04-04 NOTE — Progress Notes (Signed)
Complements WNL.  Ro antibody negative.

## 2023-04-05 LAB — IRON,TIBC AND FERRITIN PANEL: TIBC: 399 mcg/dL (calc) (ref 250–450)

## 2023-04-05 LAB — PROTEIN ELECTROPHORESIS, SERUM, WITH REFLEX
Albumin ELP: 3.8 g/dL (ref 3.8–4.8)
Alpha 1: 0.3 g/dL (ref 0.2–0.3)
Alpha 2: 0.8 g/dL (ref 0.5–0.9)
Beta 2: 0.4 g/dL (ref 0.2–0.5)
Beta Globulin: 0.5 g/dL (ref 0.4–0.6)
Gamma Globulin: 0.8 g/dL (ref 0.8–1.7)

## 2023-04-05 LAB — ANTI-NUCLEAR AB-TITER (ANA TITER)

## 2023-04-05 LAB — PROTEIN / CREATININE RATIO, URINE
Creatinine, Urine: 65 mg/dL (ref 20–275)
Total Protein, Urine: 4 mg/dL — ABNORMAL LOW (ref 5–24)

## 2023-04-05 LAB — CBC WITH DIFFERENTIAL/PLATELET
Basophils Absolute: 52 cells/uL (ref 0–200)
Eosinophils Absolute: 322 cells/uL (ref 15–500)
Eosinophils Relative: 3.7 %
Monocytes Relative: 7.7 %
Total Lymphocyte: 26.3 %
WBC: 8.7 10*3/uL (ref 3.8–10.8)

## 2023-04-05 LAB — COMPLETE METABOLIC PANEL WITH GFR
AG Ratio: 1.6 (calc) (ref 1.0–2.5)
BUN: 6 mg/dL — ABNORMAL LOW (ref 7–25)
Chloride: 98 mmol/L (ref 98–110)
Globulin: 2.5 g/dL (calc) (ref 1.9–3.7)
Total Bilirubin: 0.6 mg/dL (ref 0.2–1.2)

## 2023-04-05 LAB — C3 AND C4: C3 Complement: 155 mg/dL (ref 83–193)

## 2023-04-05 LAB — SEDIMENTATION RATE: Sed Rate: 17 mm/h (ref 0–30)

## 2023-04-05 LAB — VITAMIN D 25 HYDROXY (VIT D DEFICIENCY, FRACTURES): Vit D, 25-Hydroxy: 46 ng/mL (ref 30–100)

## 2023-04-05 NOTE — Progress Notes (Signed)
ANA remains positive-titer is unchanged.   SPEP pending

## 2023-04-05 NOTE — Progress Notes (Signed)
SPEP did not reveal any abnormal protein bands.

## 2023-05-13 DIAGNOSIS — Z1231 Encounter for screening mammogram for malignant neoplasm of breast: Secondary | ICD-10-CM | POA: Diagnosis not present

## 2023-05-13 DIAGNOSIS — Z7983 Long term (current) use of bisphosphonates: Secondary | ICD-10-CM | POA: Diagnosis not present

## 2023-05-13 DIAGNOSIS — D509 Iron deficiency anemia, unspecified: Secondary | ICD-10-CM | POA: Diagnosis not present

## 2023-05-13 DIAGNOSIS — M859 Disorder of bone density and structure, unspecified: Secondary | ICD-10-CM | POA: Diagnosis not present

## 2023-05-13 DIAGNOSIS — R112 Nausea with vomiting, unspecified: Secondary | ICD-10-CM | POA: Diagnosis not present

## 2023-05-13 LAB — HM DEXA SCAN

## 2023-05-14 ENCOUNTER — Telehealth: Payer: Self-pay | Admitting: *Deleted

## 2023-05-14 NOTE — Telephone Encounter (Signed)
Received DEXA results from [facility].  Date of Scan: 05/13/2023  Lowest T-score:-2.5  BMD:0.777  Lowest site measured:AP Spine   DX: Osteoporosis   Significant changes in BMD and site measured (5% and above):-13 % Right Total Femur, -14 % Left Total Femur  Current Regimen: Calcium, Vitamin D  Recommendation: Schedule appointment to discuss treatment   Reviewed by:Dr. Pollyann Savoy   Next Appointment:  09/17/2023

## 2023-05-15 NOTE — Telephone Encounter (Signed)
Attempted to contact the patient and left message for patient to call the office to discuss.

## 2023-05-23 NOTE — Telephone Encounter (Signed)
Patient contacted the office and inquired about her DEXA results. Advised the patient she needs to schedule an appointment to discuss results and treatment options. Patient verbalized understanding.

## 2023-05-28 DIAGNOSIS — G4733 Obstructive sleep apnea (adult) (pediatric): Secondary | ICD-10-CM | POA: Diagnosis not present

## 2023-05-30 NOTE — Progress Notes (Signed)
DEXA consistent with osteoporosis.  We will need to discuss treatment options at her follow-up visit on 06/25/2023.

## 2023-06-03 DIAGNOSIS — H2513 Age-related nuclear cataract, bilateral: Secondary | ICD-10-CM | POA: Diagnosis not present

## 2023-06-03 HISTORY — PX: CATARACT EXTRACTION, BILATERAL: SHX1313

## 2023-06-18 ENCOUNTER — Encounter: Payer: Self-pay | Admitting: Neurology

## 2023-06-19 DIAGNOSIS — H25811 Combined forms of age-related cataract, right eye: Secondary | ICD-10-CM | POA: Diagnosis not present

## 2023-06-19 DIAGNOSIS — H2511 Age-related nuclear cataract, right eye: Secondary | ICD-10-CM | POA: Diagnosis not present

## 2023-06-25 ENCOUNTER — Ambulatory Visit: Payer: Medicare HMO | Admitting: Physician Assistant

## 2023-06-27 DIAGNOSIS — G4733 Obstructive sleep apnea (adult) (pediatric): Secondary | ICD-10-CM | POA: Diagnosis not present

## 2023-06-28 DIAGNOSIS — G4733 Obstructive sleep apnea (adult) (pediatric): Secondary | ICD-10-CM | POA: Diagnosis not present

## 2023-07-02 NOTE — Progress Notes (Unsigned)
Office Visit Note  Patient: Alison [name]             Date of Birth: 02/26/1956           MRN: [id number]             PCP: Richmond Campbell., PA-C Referring: Richmond Campbell., PA-C Visit Date: 07/16/2023 Occupation: @GUAROCC @  Subjective:  Discuss DEXA results and treatment options  History of Present Illness: Alison [name] is a 66 y.o. female with history of sjogren's syndrome, osteoporosis, and sarcoidosis.  Patient presents today to discuss DEXA results and treatment options.  Patient was previously treated with Fosamax from November 2017 to July 2019.  She had improvement in her BMD while taking Fosamax.  Patient states that during 2023 she did not take calcium and vitamin D consistently but has started to increase the frequency of calcium and vitamin D supplementation this year.  She denies any recent falls or fractures.  She had updated bone density on 05/13/2023 which revealed osteoporosis.  Patient is open to discussing treatment options today.   Activities of Daily Living:  Patient reports morning stiffness for 0 minutes.   Patient Denies nocturnal pain.  Difficulty dressing/grooming: Denies Difficulty climbing stairs: Denies Difficulty getting out of chair: Denies Difficulty using hands for taps, buttons, cutlery, and/or writing: Denies  Review of Systems  Constitutional:  Positive for fatigue.  HENT:  Positive for mouth dryness. Negative for mouth sores.   Eyes:  Positive for dryness.  Respiratory:  Negative for shortness of breath.   Cardiovascular:  Negative for chest pain and palpitations.  Gastrointestinal:  Negative for blood in stool, constipation and diarrhea.  Endocrine: Negative for increased urination.  Genitourinary:  Negative for involuntary urination.  Musculoskeletal:  Positive for joint pain and joint pain. Negative for gait problem, joint swelling, myalgias, muscle weakness, morning stiffness, muscle tenderness and myalgias.  Skin:  Negative for  color change, rash, hair loss and sensitivity to sunlight.  Allergic/Immunologic: Negative for susceptible to infections.  Neurological:  Negative for dizziness and headaches.  Hematological:  Negative for swollen glands.  Psychiatric/Behavioral:  Positive for sleep disturbance. Negative for depressed mood. The patient is not nervous/anxious.     PMFS History:  Patient Active Problem List   Diagnosis Date Noted  . Hilar lymphadenopathy 03/16/2017  . High risk medication use 03/16/2017  . Age-related osteoporosis without current pathological fracture 03/16/2017  . ANA positive 03/11/2017  . Abnormal serum angiotensin-converting enzyme level 03/11/2017  . Chronic cough 01/22/2013  . Dysphagia 10/16/2012  . Sarcoidosis, lung, stage I 02/04/2012  . At risk for coronary artery disease 04/25/2011  . Endometriosis   . Hypertension   . Hyperlipidemia   . RLS (restless legs syndrome)     Past Medical History:  Diagnosis Date  . Anemia    hx of due to heavy menstral cycles  . Chest pain   . Endometriosis   . GERD (gastroesophageal reflux disease)   . Headache(784.0)    occassional headaches  . Hiatal hernia   . Hyperlipidemia   . Hypertension   . Inflammatory arthritis   . Mediastinal adenopathy 02/01/2012   PET HOT, High ACE level, EBUS 02/26/12  . RLS (restless legs syndrome)   . Sleep apnea    moderate- on CPAP  . Vitamin D deficiency     Family History  Problem Relation Age of Onset  . Asthma Mother   . Diabetes Mother  and sister, MGM  . Heart disease Father   . Heart attack Father   . Hypertension Father   . Alzheimer's disease Father   . Cancer Maternal Grandfather   . Hypertension Paternal Grandmother   . Sleep apnea Neg Hx    Past Surgical History:  Procedure Laterality Date  . CATARACT EXTRACTION, BILATERAL Bilateral 06/2023  . CESAREAN SECTION    . ENDOMETRIAL BIOPSY    . FINGER SURGERY     x2  . TONSILLECTOMY    . TUMOR REMOVAL     gastric  schwannoma   Social History   Social History Narrative  . Not on file   Immunization History  Administered Date(s) Administered  . Influenza Split 09/03/2011, 10/03/2012  . Influenza, Quadrivalent, Recombinant, Inj, Pf 08/14/2019  . Moderna Sars-Covid-2 Vaccination 01/29/2020, 02/26/2020, 09/02/2020  . Pneumococcal Polysaccharide-23 01/04/2012  . Zoster Recombinant(Shingrix) 06/20/2018, 03/07/2019     Objective: Vital Signs: BP 119/80 (BP Location: Left Arm, Patient Position: Sitting, Cuff Size: Normal)   Pulse 79   Resp 16   Ht 5' 6.5" (1.689 m)   Wt 159 lb (72.1 kg)   BMI 25.28 kg/m    Physical Exam Vitals and nursing note reviewed.  Constitutional:      Appearance: She is well-developed.  HENT:     Head: Normocephalic and atraumatic.  Eyes:     Conjunctiva/sclera: Conjunctivae normal.  Cardiovascular:     Rate and Rhythm: Normal rate and regular rhythm.     Heart sounds: Normal heart sounds.  Pulmonary:     Effort: Pulmonary effort is normal.     Breath sounds: Normal breath sounds.  Abdominal:     General: Bowel sounds are normal.     Palpations: Abdomen is soft.  Musculoskeletal:     Cervical back: Normal range of motion.  Lymphadenopathy:     Cervical: No cervical adenopathy.  Skin:    General: Skin is warm and dry.     Capillary Refill: Capillary refill takes less than 2 seconds.  Neurological:     Mental Status: She is alert and oriented to person, place, and time.  Psychiatric:        Behavior: Behavior normal.     Musculoskeletal Exam: C-spine, thoracic spine, lumbar spine have good range of motion.  Shoulder joints have good range of motion with no discomfort.  Elbow joints, wrist joints, MCPs, PIPs, DIPs have good range of motion with no synovitis.  Complete fist formation bilaterally.  Hip joints have good range of motion with no groin pain.  Knee joints have good range of motion with no warmth or effusion.  Ankle joints have good range of motion  with no tenderness or joint swelling.  CDAI Exam: CDAI Score: -- Patient Global: --; Provider Global: -- Swollen: --; Tender: -- Joint Exam 07/16/2023   No joint exam has been documented for this visit   There is currently no information documented on the homunculus. Go to the Rheumatology activity and complete the homunculus joint exam.  Investigation: No additional findings.  Imaging: No results found.  Recent Labs: Lab Results  Component Value Date   WBC 8.7 04/02/2023   HGB 11.6 (L) 04/02/2023   PLT 371 04/02/2023   NA 136 04/02/2023   K 3.7 04/02/2023   CL 98 04/02/2023   CO2 27 04/02/2023   GLUCOSE 116 (H) 04/02/2023   BUN 6 (L) 04/02/2023   CREATININE 0.56 04/02/2023   BILITOT 0.6 04/02/2023   ALKPHOS 100 05/31/2017  AST 30 04/02/2023   ALT 37 (H) 04/02/2023   PROT 6.7 04/02/2023   PROT 6.6 04/02/2023   ALBUMIN 4.2 05/31/2017   CALCIUM 9.9 04/02/2023   GFRAA 108 06/25/2019    Speciality Comments: No specialty comments available.  Procedures:  No procedures performed Allergies: Patient has no known allergies.   Assessment / Plan:     Visit Diagnoses: Sarcoidosis, lung, stage I - +ACE, hilar lymphadenopathy Chest x-ray 2019 was negative: No recurrence.  Chest x-ray did not reveal any active cardiopulmonary disease on 10/13/2021.  No new or worsening pulmonary symptoms.  Not currently taking immunosuppressive agents.  Followed closely by ophthalmology-no signs or symptoms of uveitis.  Patient recently had bilateral cataract surgeries.  Hilar lymphadenopathy  High risk medication use - She is not currently taking immunosuppressive agents.  Discontinued methotrexate in February 2020 due to elevated LFTs.  Plan: COMPLETE METABOLIC PANEL WITH GFR  Primary osteoarthritis of both hands: She experiences stiffness first thing in the mornings in both hands.  On examination today no tenderness or synovitis noted.  Complete fist formation noted  bilaterally.  Sjogren's syndrome with other organ involvement (HCC) - Positive ANA and +Ro: using humidifer, pilocarpine PRN, dentist q6 months and followed by ophthalmology on a regular basis.  Since using a CPAP she has had less mouth dryness and has been taking pilocarpine less frequently.  She has not had any new or worsening pulmonary symptoms.  Lungs were clear to auscultation today.  She does not require immunosuppression at this time.  Lab work was updated on 04/02/2023: ANA 1: 320 nuclear, fine speckled, Ro antibody negative, SPEP normal, ESR within normal limits, complements within normal limits.  No signs of inflammatory arthritis.   Age-related osteoporosis without current pathological fracture - Previous DEXA 05/04/2021: AP spine -2.3, BMD 0.799% -4%. DEXA updated on 05/13/2023: AP spine BMD 0.777 with T-score -2.5. -13% decrease Right total femur and -14% decrease in Left total femur.  DEXA results were reviewed with the patient today in detail.  Patient presents today to discuss results and treatment options.  Tolerated Fosamax from Nov 2017- July 2019.  Patient had a statistically significant improvement while taking Fosamax.  Reviewed DEXA results from 2019 and 2022 today.  She has been on a drug holiday.  Patient reports not taking calcium and vitamin D consistently in 2023 but has started to increase the frequency of calcium and vitamin D supplementation this year.  She has not had any recent falls or fractures. Due to the patient's history of reflux and difficulty with weekly dosing of Fosamax we will proceed with applying for IV Reclast infusions once yearly.  Indications, contraindications, and potential side effects of IV Reclast were discussed today in detail.  All questions were addressed. Upcoming routine dental cleaning next week-no other scheduled dental work.  Vitamin D was within normal limits on 04/02/2023.  CMP with GFR updated today.  Vitamin D deficiency: Vitamin D 46-WNL on  04/02/23. She is taking vitamin D 1000 units daily.   Other medical conditions are listed as follows:   History of hyperlipidemia  History of hypertension: BP was 119/80 today in the office.   RLS (restless legs syndrome)  Primary insomnia  Orders: Orders Placed This Encounter  Procedures  . COMPLETE METABOLIC PANEL WITH GFR   No orders of the defined types were placed in this encounter.    Follow-Up Instructions: Return for Sjogren's syndrome, Osteoporosis, Sarcoidosis.   Gearldine Bienenstock, PA-C  Note - This record  has been created using [employer].  Chart creation errors have been sought, but may not always  have been located. Such creation errors do not reflect on  the standard of medical care.

## 2023-07-03 DIAGNOSIS — H25812 Combined forms of age-related cataract, left eye: Secondary | ICD-10-CM | POA: Diagnosis not present

## 2023-07-03 DIAGNOSIS — H2512 Age-related nuclear cataract, left eye: Secondary | ICD-10-CM | POA: Diagnosis not present

## 2023-07-16 ENCOUNTER — Ambulatory Visit: Payer: Medicare HMO | Attending: Physician Assistant | Admitting: Physician Assistant

## 2023-07-16 ENCOUNTER — Telehealth: Payer: Self-pay | Admitting: Pharmacist

## 2023-07-16 ENCOUNTER — Encounter: Payer: Self-pay | Admitting: Physician Assistant

## 2023-07-16 VITALS — BP 119/80 | HR 79 | Resp 16 | Ht 66.5 in | Wt 159.0 lb

## 2023-07-16 DIAGNOSIS — F5101 Primary insomnia: Secondary | ICD-10-CM | POA: Diagnosis not present

## 2023-07-16 DIAGNOSIS — Z8679 Personal history of other diseases of the circulatory system: Secondary | ICD-10-CM

## 2023-07-16 DIAGNOSIS — G2581 Restless legs syndrome: Secondary | ICD-10-CM

## 2023-07-16 DIAGNOSIS — Z8639 Personal history of other endocrine, nutritional and metabolic disease: Secondary | ICD-10-CM

## 2023-07-16 DIAGNOSIS — Z79899 Other long term (current) drug therapy: Secondary | ICD-10-CM

## 2023-07-16 DIAGNOSIS — E559 Vitamin D deficiency, unspecified: Secondary | ICD-10-CM

## 2023-07-16 DIAGNOSIS — M810 Age-related osteoporosis without current pathological fracture: Secondary | ICD-10-CM | POA: Diagnosis not present

## 2023-07-16 DIAGNOSIS — R590 Localized enlarged lymph nodes: Secondary | ICD-10-CM

## 2023-07-16 DIAGNOSIS — D860 Sarcoidosis of lung: Secondary | ICD-10-CM | POA: Diagnosis not present

## 2023-07-16 DIAGNOSIS — M19041 Primary osteoarthritis, right hand: Secondary | ICD-10-CM

## 2023-07-16 DIAGNOSIS — M3509 Sicca syndrome with other organ involvement: Secondary | ICD-10-CM

## 2023-07-16 DIAGNOSIS — M19042 Primary osteoarthritis, left hand: Secondary | ICD-10-CM

## 2023-07-16 DIAGNOSIS — M8589 Other specified disorders of bone density and structure, multiple sites: Secondary | ICD-10-CM

## 2023-07-16 NOTE — Telephone Encounter (Signed)
Please start Reclast BIV through medical benefit.  Reclast - [id number]  Dose: 5mg  IV every 12 months  Dx: Age-related osteoporosis (M80.0)  Previously tried therapies: Alendronate - history of dysphagia  Therapies patient unable to try: Oral bisphosphonates - history of dysphagia  Chesley Mires, PharmD, MPH, BCPS, CPP Clinical Pharmacist (Rheumatology and Pulmonology)

## 2023-07-16 NOTE — Progress Notes (Signed)
Pharmacy Note  Subjective: Patient presents today to the [facility] Rheumatology for follow up office visit.  Patient seen by pharmacist for counseling on Reclast therapy for osteoporosis. Prior osteoporosis treatment includes: Fosamax (November 2017 to July 2019). She is taking calcium and vitamin D supplements and also reports taking nutritional sources of calcium.   Objective: T-score: Right femur neck: -1.80  Lab Results  Component Value Date   VD25OH 46 04/02/2023   CMP     Component Value Date/Time   NA 136 04/02/2023 1604   K 3.7 04/02/2023 1604   CL 98 04/02/2023 1604   CO2 27 04/02/2023 1604   GLUCOSE 116 (H) 04/02/2023 1604   BUN 6 (L) 04/02/2023 1604   CREATININE 0.56 04/02/2023 1604   CALCIUM 9.9 04/02/2023 1604   PROT 6.7 04/02/2023 1604   PROT 6.6 04/02/2023 1604   ALBUMIN 4.2 05/31/2017 1130   AST 30 04/02/2023 1604   ALT 37 (H) 04/02/2023 1604   ALKPHOS 100 05/31/2017 1130   BILITOT 0.6 04/02/2023 1604   GFRNONAA 93 06/25/2019 1056   GFRAA 108 06/25/2019 1056    Osteoporosis risk factors include: Female and age.   Assessment and Plan:  Counseled patient that Reclast is an IV bisphosphonate that reduces bone turnover by inhibiting osteoclasts that chew up bone.  Counseled patient on purpose, proper use, and adverse effects of Reclast.  Reviewed adverse events of Reclast including risk of nausea & diarrhea, headache, and muscle & bone pain.  Reviewed rare adverse effect of osteonecrosis of the jaw and advised patient to alert her dentist that she is on Reclast prior to any major dental work. She does have an appointment for a routine dental cleaning next week. Patient confirms she does not have any major dental work scheduled at this time.    Reviewed importance of taking calcium and vitamin D with bisphosphonate therapy. Recommended daily amount of calcium is 1200mg  and vitamin D [phone] units.  Advised calcium is better obtained through diet vs supplement.   Counseled about risk of excess calcium supplementation such a kidney stones and increased risk of heart disease.   Provided patient with medication education material and answered all questions. Patient agrees to trial of Reclast IV infusion 5 mg yearly at this time.  Reclast is covered 80% by Medicare Part B and the remaining 20% can be covered by a supplemental plan. Unable to provide exact co-pay amount as it is billed through medical not pharmacy benefit.  Patient verbalized understanding.   Sofie Rower, PharmD, [employer] PGY1

## 2023-07-17 NOTE — Progress Notes (Signed)
Glucose is 134. ALT remains borderline elevated but stable. Rest of CMP WNL

## 2023-07-17 NOTE — Progress Notes (Signed)
Ok to proceed with IV reclast once approved

## 2023-07-28 DIAGNOSIS — G4733 Obstructive sleep apnea (adult) (pediatric): Secondary | ICD-10-CM | POA: Diagnosis not present

## 2023-08-02 NOTE — Telephone Encounter (Signed)
Contacted [employer] to investigate [phone] through patient's medical benefits. Per rep, provider is In-Network and a Pre-Authorization is NOT required for this J-Code. The patient will be responsible for a 20% coinsurance charge until any Deductible and/or Maximum Out of Pocket requirements have been met for the current calendar year.   At this time the patient has no deductible requirement and has met $771.45 out of their $4,500 Maximum Out of Pocket requirement.  Call Ref#: [id number] Phone#: [phone]

## 2023-08-06 ENCOUNTER — Encounter: Payer: Self-pay | Admitting: Adult Health

## 2023-08-06 ENCOUNTER — Ambulatory Visit: Payer: Medicare HMO | Admitting: Adult Health

## 2023-08-06 VITALS — BP 127/68 | HR 86 | Ht 66.0 in | Wt 162.0 lb

## 2023-08-06 DIAGNOSIS — G4733 Obstructive sleep apnea (adult) (pediatric): Secondary | ICD-10-CM | POA: Diagnosis not present

## 2023-08-06 NOTE — Progress Notes (Unsigned)
PATIENT: Alison [name] DOB: 07-Mar-1956  REASON FOR VISIT: follow up HISTORY FROM: patient PRIMARY NEUROLOGIST: Dr. Frances Furbish      PATIENT: Alison [name] DOB: 1955/12/08  REASON FOR VISIT: follow up HISTORY FROM: patient  HISTORY OF PRESENT ILLNESS: Today 08/06/23:  Alison [name] is a 67 y.o. female with a history of OSA on CPAp. Returns today for follow-up.  This is the patient's initial CPAP compliance visit.  She states that since she has started CPAP she continues to wake up just as frequently during the night.  She is not sure that has been helpful at all.  She avoids caffeine.  States that she typically does not have any trouble falling asleep.  She just wakes up frequently and feels like she does not get enough sleep.  Patient is very tearful today.  She states that she has worked on her sleep hygiene.  Tried over-the-counter supplements and nothing seems to help her sleep.  Denies any significant stress, anxiety or depression  RLS was bad in October 2023- RLS improved with the increase in iron level.Has FU with GP in October.  She does not feel that her restless legs is keeping her up at night.      HISTORY (copied from Dr. Teofilo Pod note) Alison [name] is a 67 year old female with an underlying medical history of Sjogren's syndrome, sarcoidosis, osteoarthritis, osteopenia, anemia, endometriosis, reflux disease, headaches, hypertension, hyperlipidemia, vitamin D deficiency, and borderline overweight state, who reports a longstanding history of restless leg syndrome of at least 25 years.  She has been on ropinirole for the past at least 4 years per PCP.  She currently takes 0.5 mg at 8 PM and 1.5 mg at bedtime which is around 11 PM she still has residual symptoms and often does not sleep through the night, waking up with restlessness but also other sleep disruption secondary to nocturia which is at least 3-4 times per average night.  Of note, she does snore, her husband has noted  occasional pauses in her breathing and she has rarely woken herself up with a sense of gasping for air.  She has never had a sleep study.  Symptoms have become worse over time including her sleep disruption and restless leg symptoms which she describes as a creepy crawly sensation, she does not typically twitch or kick her legs in her sleep but does toss and turn a lot.  Her Epworth sleepiness score is 9 out of 24, fatigue severity score is 39 out of 63.  She is married and lives with her husband, she works as a Loss adjuster, chartered.  She has 1 grown son.  Her weight has been more or less stable.  She denies recurrent nocturnal or morning headaches but has nocturia.   She brought in a sleep log and restless leg symptom log for me today which started with end of October 2023.  I reviewed her symptoms and log.  She is a non-smoker.  She drinks alcohol occasionally, about twice a week, she drinks caffeine in the form of tea, 2 servings per day on average.   I reviewed your office note from 10/30/2022.  She had blood work at the time which I reviewed.  Iron studies showed severely low iron with total iron 27, TIBC elevated at 463, percent saturation very low at 6, ferritin very low at 6.  CBC also showed a mildly low hemoglobin at 11.3, hematocrit below normal at 77.8, [facility] also below normal at 25.1, platelets  mildly elevated at 421.  She was encouraged to follow-up with primary care for iron deficiency management.  She has not had a recent TSH.   She has an upcoming appointment with her primary care next month.  She has started an iron supplement over-the-counter.  She does report lack of appetite and some weight loss recently, has not had it investigated yet.  She does not eat meat and has always been like this but has had a change in her appetite.  REVIEW OF SYSTEMS: Out of a complete 14 system review of symptoms, the patient complains only of the following symptoms, and all other reviewed systems are  negative.  ALLERGIES: No Known Allergies  HOME MEDICATIONS: Outpatient Medications Prior to Visit  Medication Sig Dispense Refill   calcium carbonate 1250 MG capsule Take 1,250 mg by mouth daily.     Carboxymethylcellul-Glycerin (LUBRICATING EYE DROPS OP) Apply to eye as needed.     cholecalciferol (VITAMIN D) 1000 UNITS tablet Take 1,000 Units by mouth daily.     eszopiclone (LUNESTA) 2 MG TABS tablet Take 2 mg by mouth as needed.     ferrous sulfate 325 (65 FE) MG tablet Take by mouth.     metFORMIN (GLUCOPHAGE-XR) 500 MG 24 hr tablet Take 500 mg by mouth 2 (two) times daily.     pantoprazole (PROTONIX) 40 MG tablet Take 1 tablet by mouth 2 (two) times daily.     pilocarpine (SALAGEN) 5 MG tablet Take 1 tablet (5 mg total) by mouth 3 (three) times daily as needed. (Patient not taking: Reported on 07/16/2023) 90 tablet 2   rOPINIRole (REQUIP) 0.5 MG tablet Take 2 mg by mouth at bedtime.     rosuvastatin (CRESTOR) 10 MG tablet Take 10 mg by mouth daily.     triamcinolone cream (KENALOG) 0.1 % Apply topically. (Patient not taking: Reported on 07/16/2023)     valsartan-hydrochlorothiazide (DIOVAN-HCT) 320-25 MG per tablet Take 0.5 tablets by mouth daily.     No facility-administered medications prior to visit.    PAST MEDICAL HISTORY: Past Medical History:  Diagnosis Date   Anemia    hx of due to heavy menstral cycles   Chest pain    Endometriosis    GERD (gastroesophageal reflux disease)    Headache(784.0)    occassional headaches   Hiatal hernia    Hyperlipidemia    Hypertension    Inflammatory arthritis    Mediastinal adenopathy 02/01/2012   PET HOT, High ACE level, EBUS 02/26/12   RLS (restless legs syndrome)    Sleep apnea    moderate- on CPAP   Vitamin D deficiency     PAST SURGICAL HISTORY: Past Surgical History:  Procedure Laterality Date   CATARACT EXTRACTION, BILATERAL Bilateral 06/2023   CESAREAN SECTION     ENDOMETRIAL BIOPSY     FINGER SURGERY     x2    TONSILLECTOMY     TUMOR REMOVAL     gastric schwannoma    FAMILY HISTORY: Family History  Problem Relation Age of Onset   Asthma Mother    Diabetes Mother        and sister, MGM   Heart disease Father    Heart attack Father    Hypertension Father    Alzheimer's disease Father    Cancer Maternal Grandfather    Hypertension Paternal Grandmother    Sleep apnea Neg Hx     SOCIAL HISTORY: Social History   Socioeconomic History   Marital status: Married  Spouse name: Not on file   Number of children: 1   Years of education: Not on file   Highest education level: Not on file  Occupational History   Occupation: music teacher  Tobacco Use   Smoking status: Never    Passive exposure: Never   Smokeless tobacco: Never  Vaping Use   Vaping status: Never Used  Substance and Sexual Activity   Alcohol use: Yes    Comment: social   Drug use: Never   Sexual activity: Not on file  Other Topics Concern   Not on file  Social History Narrative   Not on file   Social Determinants of Health   Financial Resource Strain: Not on file  Food Insecurity: Not on file  Transportation Needs: Not on file  Physical Activity: Not on file  Stress: Not on file  Social Connections: Not on file  Intimate Partner Violence: Not on file      PHYSICAL EXAM Generalized: Well developed, in no acute distress   Neurological examination  Mentation: Alert oriented to time, place, history taking. Follows all commands speech and language fluent Cranial nerve II-XII:Extraocular movements were full. Facial symmetry noted. uvula tongue midline. Head turning and shoulder shrug  were normal and symmetric. Motor: Good strength throughout subjectively per patient Sensory: Sensory testing is intact to soft touch on all 4 extremities subjectively per patient Coordination: Cerebellar testing reveals good finger-nose-finger  Gait and station: Patient is able to stand from a seated position. gait is normal.   Reflexes: UTA  DIAGNOSTIC DATA (LABS, IMAGING, TESTING) - I reviewed patient records, labs, notes, testing and imaging myself where available.  Lab Results  Component Value Date   WBC 8.7 04/02/2023   HGB 11.6 (L) 04/02/2023   HCT 36.7 04/02/2023   MCV 80.0 04/02/2023   PLT 371 04/02/2023      Component Value Date/Time   NA 138 07/16/2023 0933   K 3.8 07/16/2023 0933   CL 98 07/16/2023 0933   CO2 28 07/16/2023 0933   GLUCOSE 134 (H) 07/16/2023 0933   BUN 7 07/16/2023 0933   CREATININE 0.60 07/16/2023 0933   CALCIUM 9.7 07/16/2023 0933   PROT 6.9 07/16/2023 0933   ALBUMIN 4.2 05/31/2017 1130   AST 27 07/16/2023 0933   ALT 35 (H) 07/16/2023 0933   ALKPHOS 100 05/31/2017 1130   BILITOT 0.7 07/16/2023 0933   GFRNONAA 93 06/25/2019 1056   GFRAA 108 06/25/2019 1056   No results found for: "CHOL", "HDL", "LDLCALC", "LDLDIRECT", "TRIG", "CHOLHDL" No results found for: "HGBA1C" No results found for: "VITAMINB12" No results found for: "TSH"    ASSESSMENT AND PLAN 67 y.o. year old female  has a past medical history of Anemia, Chest pain, Endometriosis, GERD (gastroesophageal reflux disease), Headache(784.0), Hiatal hernia, Hyperlipidemia, Hypertension, Inflammatory arthritis, Mediastinal adenopathy (02/01/2012), RLS (restless legs syndrome), Sleep apnea, and Vitamin D deficiency. here with:  OSA on CPAP Sleep disturbance  CPAP compliance excellent Residual AHI is good Encouraged patient to continue using CPAP nightly and > 4 hours each night Discussed with Dr. Frances Furbish.  The patient still is having trouble sleeping through the night.  Her home sleep test showed that she actually slept fairly well during the sleep study.  We will do a CPAP titration to compare how she is doing now on CPAP therapy to her original study in regards to her sleep quality.   Butch Penny, MSN, NP-C 08/06/2023, 11:21 AM [facility] [address], [address] [place], [place]  [address] [phone]  

## 2023-08-07 ENCOUNTER — Telehealth: Payer: Self-pay | Admitting: *Deleted

## 2023-08-07 NOTE — Telephone Encounter (Signed)
Patient contacted the office to follow up on Reclast. Please contact patient to advise.

## 2023-08-08 ENCOUNTER — Telehealth: Payer: Self-pay | Admitting: Neurology

## 2023-08-08 NOTE — Telephone Encounter (Signed)
I called patient.  Reviewed plan of care.  See office note

## 2023-08-08 NOTE — Telephone Encounter (Signed)
Please call pt when available

## 2023-08-08 NOTE — Telephone Encounter (Signed)
Pt said voicemail from Megan,NP yesterday. Returning phone call.

## 2023-08-08 NOTE — Progress Notes (Signed)
PATIENT: Alison [name] DOB: 1956/07/13  REASON FOR VISIT: follow up HISTORY FROM: patient PRIMARY NEUROLOGIST: Dr. Frances Furbish   PATIENT: Alison [name] DOB: Dec 15, 1955  REASON FOR VISIT: follow up HISTORY FROM: patient  HISTORY OF PRESENT ILLNESS: Today 08/08/23:  Alison [name] is a 67 y.o. female with a history of OSA on CPAp. Returns today for follow-up.  This is the patient's initial CPAP compliance visit.  She states that since she has started CPAP she continues to wake up just as frequently during the night.  She is not sure that has been helpful at all.  She avoids caffeine.  States that she typically does not have any trouble falling asleep.  She just wakes up frequently and feels like she does not get enough sleep.  Patient is very tearful today.  She states that she has worked on her sleep hygiene.  Tried over-the-counter supplements and nothing seems to help her sleep.  Denies any significant stress, anxiety or depression  RLS was bad in October 2023- RLS improved with the increase in iron level.Has FU with GP in October.  She does not feel that her restless legs is keeping her up at night.      HISTORY (copied from Dr. Teofilo Pod note) Alison [name] is a 67 year old female with an underlying medical history of Sjogren's syndrome, sarcoidosis, osteoarthritis, osteopenia, anemia, endometriosis, reflux disease, headaches, hypertension, hyperlipidemia, vitamin D deficiency, and borderline overweight state, who reports a longstanding history of restless leg syndrome of at least 25 years.  She has been on ropinirole for the past at least 4 years per PCP.  She currently takes 0.5 mg at 8 PM and 1.5 mg at bedtime which is around 11 PM she still has residual symptoms and often does not sleep through the night, waking up with restlessness but also other sleep disruption secondary to nocturia which is at least 3-4 times per average night.  Of note, she does snore, her husband has noted  occasional pauses in her breathing and she has rarely woken herself up with a sense of gasping for air.  She has never had a sleep study.  Symptoms have become worse over time including her sleep disruption and restless leg symptoms which she describes as a creepy crawly sensation, she does not typically twitch or kick her legs in her sleep but does toss and turn a lot.  Her Epworth sleepiness score is 9 out of 24, fatigue severity score is 39 out of 63.  She is married and lives with her husband, she works as a Loss adjuster, [employer].  She has 1 grown son.  Her weight has been more or less stable.  She denies recurrent nocturnal or morning headaches but has nocturia.   She brought in a sleep log and restless leg symptom log for me today which started with end of October 2023.  I reviewed her symptoms and log.  She is a non-smoker.  She drinks alcohol occasionally, about twice a week, she drinks caffeine in the form of tea, 2 servings per day on average.   I reviewed your office note from 10/30/2022.  She had blood work at the time which I reviewed.  Iron studies showed severely low iron with total iron 27, TIBC elevated at 463, percent saturation very low at 6, ferritin very low at 6.  CBC also showed a mildly low hemoglobin at 11.3, hematocrit below normal at 77.8, [facility] also below normal at 25.1, platelets mildly elevated at  421.  She was encouraged to follow-up with primary care for iron deficiency management.  She has not had a recent TSH.   She has an upcoming appointment with her primary care next month.  She has started an iron supplement over-the-counter.  She does report lack of appetite and some weight loss recently, has not had it investigated yet.  She does not eat meat and has always been like this but has had a change in her appetite.  REVIEW OF SYSTEMS: Out of a complete 14 system review of symptoms, the patient complains only of the following symptoms, and all other reviewed systems are  negative.  ALLERGIES: No Known Allergies  HOME MEDICATIONS: Outpatient Medications Prior to Visit  Medication Sig Dispense Refill   calcium carbonate 1250 MG capsule Take 1,250 mg by mouth daily.     Carboxymethylcellul-Glycerin (LUBRICATING EYE DROPS OP) Apply to eye as needed.     cholecalciferol (VITAMIN D) 1000 UNITS tablet Take 1,000 Units by mouth daily.     eszopiclone (LUNESTA) 2 MG TABS tablet Take 2 mg by mouth as needed.     ferrous sulfate 325 (65 FE) MG tablet Take by mouth.     metFORMIN (GLUCOPHAGE-XR) 500 MG 24 hr tablet Take 500 mg by mouth 2 (two) times daily.     pantoprazole (PROTONIX) 40 MG tablet Take 1 tablet by mouth 2 (two) times daily.     pilocarpine (SALAGEN) 5 MG tablet Take 1 tablet (5 mg total) by mouth 3 (three) times daily as needed. (Patient taking differently: Take 5 mg by mouth as needed.) 90 tablet 2   rOPINIRole (REQUIP) 0.5 MG tablet Take 2 mg by mouth at bedtime.     rosuvastatin (CRESTOR) 10 MG tablet Take 10 mg by mouth daily.     valsartan-hydrochlorothiazide (DIOVAN-HCT) 320-25 MG per tablet Take 0.5 tablets by mouth daily.     triamcinolone cream (KENALOG) 0.1 % Apply topically. (Patient not taking: Reported on 07/16/2023)     No facility-administered medications prior to visit.    PAST MEDICAL HISTORY: Past Medical History:  Diagnosis Date   Anemia    hx of due to heavy menstral cycles   Chest pain    Endometriosis    GERD (gastroesophageal reflux disease)    Headache(784.0)    occassional headaches   Hiatal hernia    Hyperlipidemia    Hypertension    Inflammatory arthritis    Mediastinal adenopathy 02/01/2012   PET HOT, High ACE level, EBUS 02/26/12   RLS (restless legs syndrome)    Sleep apnea    moderate- on CPAP   Vitamin D deficiency     PAST SURGICAL HISTORY: Past Surgical History:  Procedure Laterality Date   CATARACT EXTRACTION, BILATERAL Bilateral 06/2023   CESAREAN SECTION     ENDOMETRIAL BIOPSY     FINGER  SURGERY     x2   TONSILLECTOMY     TUMOR REMOVAL     gastric schwannoma    FAMILY HISTORY: Family History  Problem Relation Age of Onset   Asthma Mother    Diabetes Mother        and sister, MGM   Heart disease Father    Heart attack Father    Hypertension Father    Alzheimer's disease Father    Cancer Maternal Grandfather    Hypertension Paternal Grandmother    Sleep apnea Neg Hx     SOCIAL HISTORY: Social History   Socioeconomic History   Marital status: Married  Spouse name: Not on file   Number of children: 1   Years of education: Not on file   Highest education level: Not on file  Occupational History   Occupation: music teacher  Tobacco Use   Smoking status: Never    Passive exposure: Never   Smokeless tobacco: Never  Vaping Use   Vaping status: Never Used  Substance and Sexual Activity   Alcohol use: Yes    Comment: social   Drug use: Never   Sexual activity: Not on file  Other Topics Concern   Not on file  Social History Narrative   Not on file   Social Determinants of Health   Financial Resource Strain: Not on file  Food Insecurity: Not on file  Transportation Needs: Not on file  Physical Activity: Not on file  Stress: Not on file  Social Connections: Not on file  Intimate Partner Violence: Not on file      PHYSICAL EXAM Generalized: Well developed, in no acute distress   Neurological examination  Mentation: Alert oriented to time, place, history taking. Follows all commands speech and language fluent Cranial nerve II-XII:Extraocular movements were full. Facial symmetry noted. uvula tongue midline. Head turning and shoulder shrug  were normal and symmetric.   DIAGNOSTIC DATA (LABS, IMAGING, TESTING) - I reviewed patient records, labs, notes, testing and imaging myself where available.  Lab Results  Component Value Date   WBC 8.7 04/02/2023   HGB 11.6 (L) 04/02/2023   HCT 36.7 04/02/2023   MCV 80.0 04/02/2023   PLT 371  04/02/2023      Component Value Date/Time   NA 138 07/16/2023 0933   K 3.8 07/16/2023 0933   CL 98 07/16/2023 0933   CO2 28 07/16/2023 0933   GLUCOSE 134 (H) 07/16/2023 0933   BUN 7 07/16/2023 0933   CREATININE 0.60 07/16/2023 0933   CALCIUM 9.7 07/16/2023 0933   PROT 6.9 07/16/2023 0933   ALBUMIN 4.2 05/31/2017 1130   AST 27 07/16/2023 0933   ALT 35 (H) 07/16/2023 0933   ALKPHOS 100 05/31/2017 1130   BILITOT 0.7 07/16/2023 0933   GFRNONAA 93 06/25/2019 1056   GFRAA 108 06/25/2019 1056     ASSESSMENT AND PLAN 67 y.o. year old female  has a past medical history of Anemia, Chest pain, Endometriosis, GERD (gastroesophageal reflux disease), Headache(784.0), Hiatal hernia, Hyperlipidemia, Hypertension, Inflammatory arthritis, Mediastinal adenopathy (02/01/2012), RLS (restless legs syndrome), Sleep apnea, and Vitamin D deficiency. here with:  OSA on CPAP Sleep disturbance  CPAP compliance excellent Residual AHI is good Encouraged patient to continue using CPAP nightly and > 4 hours each night Discussed with Dr. Frances Furbish.  The patient still is having trouble sleeping through the night.  Her home sleep test showed that she actually slept fairly well during the sleep study.  We will do a CPAP titration to compare how she is doing now on CPAP therapy to her original study in regards to her sleep quality. Follow-up after CPAP titration   Butch Penny, MSN, NP-C 08/08/2023, 4:22 PM [facility] [address], [address] [place], [place] [address] [phone]

## 2023-08-09 NOTE — Telephone Encounter (Signed)
Already spoke to patient about plan of care. This was a late entry by Jael from a VM.

## 2023-08-12 ENCOUNTER — Other Ambulatory Visit: Payer: Self-pay | Admitting: Pharmacist

## 2023-08-12 DIAGNOSIS — M810 Age-related osteoporosis without current pathological fracture: Secondary | ICD-10-CM

## 2023-08-12 NOTE — Progress Notes (Signed)
Patient is new start to Reclast IV [phone]). Diagnosis: age-related osteoporosis  Dose: 5mg  IV every 12 months  Last Clinic Visit: 07/16/23 Next Clinic Visit: 09/17/2023  Labs: 07/16/23 - CMP stable  Orders placed for Reclast IV (J3489) x 1 dose along with premedication of acetaminophen and diphenhydramine to be administered 30 minutes before medication infusion.  Called patient and provided with phone number for: [facility] [phone]) Ginette Otto. She states she is out of town trough 08/19/23.  Chesley Mires, PharmD, MPH, BCPS, CPP Clinical Pharmacist (Rheumatology and Pulmonology)

## 2023-08-13 ENCOUNTER — Telehealth: Payer: Self-pay | Admitting: Adult Health

## 2023-08-13 NOTE — Telephone Encounter (Signed)
CPAP pending uploaded notes.

## 2023-08-15 NOTE — Telephone Encounter (Signed)
Sent mychart message to the patient. 

## 2023-08-15 NOTE — Telephone Encounter (Signed)
CPAP- Orpah Clinton Berkley Harvey: [id number] (Exp. 08/1023 to 02/09/24)

## 2023-08-16 NOTE — Progress Notes (Signed)
Reclast scheduled for 08/22/23

## 2023-08-20 NOTE — Progress Notes (Signed)
Ok to use labs from 07/16/23 for reclast on 08/22/23 per devki, pharmd at Dr deveshwar's office.

## 2023-08-21 DIAGNOSIS — E7800 Pure hypercholesterolemia, unspecified: Secondary | ICD-10-CM | POA: Diagnosis not present

## 2023-08-21 DIAGNOSIS — I10 Essential (primary) hypertension: Secondary | ICD-10-CM | POA: Diagnosis not present

## 2023-08-21 DIAGNOSIS — E119 Type 2 diabetes mellitus without complications: Secondary | ICD-10-CM | POA: Diagnosis not present

## 2023-08-22 ENCOUNTER — Ambulatory Visit (HOSPITAL_COMMUNITY)
Admission: RE | Admit: 2023-08-22 | Discharge: 2023-08-22 | Disposition: A | Discharge status: Home / Self Care | Payer: Medicare HMO | Source: Ambulatory Visit | Attending: Rheumatology | Admitting: Rheumatology

## 2023-08-22 DIAGNOSIS — M810 Age-related osteoporosis without current pathological fracture: Secondary | ICD-10-CM | POA: Insufficient documentation

## 2023-08-22 MED ORDER — ACETAMINOPHEN 325 MG PO TABS: 650.0000 mg | ORAL_TABLET | Freq: Once | ORAL | Status: AC

## 2023-08-22 MED ORDER — DIPHENHYDRAMINE HCL 25 MG PO CAPS
ORAL_CAPSULE | ORAL | Status: AC
  Administered 2023-08-22: 25 mg via ORAL
  Filled 2023-08-22: qty 1

## 2023-08-22 MED ORDER — ACETAMINOPHEN 325 MG PO TABS
ORAL_TABLET | ORAL | Status: AC
  Administered 2023-08-22: 650 mg via ORAL
  Filled 2023-08-22: qty 2

## 2023-08-22 MED ORDER — ZOLEDRONIC ACID 5 MG/100ML IV SOLN
INTRAVENOUS | Status: AC
  Administered 2023-08-22: 5 mg via INTRAVENOUS
  Filled 2023-08-22: qty 100

## 2023-08-22 MED ORDER — DIPHENHYDRAMINE HCL 25 MG PO CAPS: 25.0000 mg | ORAL_CAPSULE | Freq: Once | ORAL | Status: AC

## 2023-08-22 MED ORDER — ZOLEDRONIC ACID 5 MG/100ML IV SOLN: 5.0000 mg | Freq: Once | INTRAVENOUS | Status: AC

## 2023-08-27 DIAGNOSIS — D860 Sarcoidosis of lung: Secondary | ICD-10-CM | POA: Diagnosis not present

## 2023-08-27 DIAGNOSIS — Z0000 Encounter for general adult medical examination without abnormal findings: Secondary | ICD-10-CM | POA: Diagnosis not present

## 2023-08-27 DIAGNOSIS — I10 Essential (primary) hypertension: Secondary | ICD-10-CM | POA: Diagnosis not present

## 2023-08-27 DIAGNOSIS — E7800 Pure hypercholesterolemia, unspecified: Secondary | ICD-10-CM | POA: Diagnosis not present

## 2023-08-27 DIAGNOSIS — G2581 Restless legs syndrome: Secondary | ICD-10-CM | POA: Diagnosis not present

## 2023-08-27 DIAGNOSIS — F5102 Adjustment insomnia: Secondary | ICD-10-CM | POA: Diagnosis not present

## 2023-08-27 DIAGNOSIS — D508 Other iron deficiency anemias: Secondary | ICD-10-CM | POA: Diagnosis not present

## 2023-08-27 DIAGNOSIS — E119 Type 2 diabetes mellitus without complications: Secondary | ICD-10-CM | POA: Diagnosis not present

## 2023-08-28 DIAGNOSIS — G4733 Obstructive sleep apnea (adult) (pediatric): Secondary | ICD-10-CM | POA: Diagnosis not present

## 2023-09-02 ENCOUNTER — Ambulatory Visit (INDEPENDENT_AMBULATORY_CARE_PROVIDER_SITE_OTHER): Payer: Medicare HMO | Admitting: Neurology

## 2023-09-02 DIAGNOSIS — G4720 Circadian rhythm sleep disorder, unspecified type: Secondary | ICD-10-CM

## 2023-09-02 DIAGNOSIS — G4733 Obstructive sleep apnea (adult) (pediatric): Secondary | ICD-10-CM | POA: Diagnosis not present

## 2023-09-03 NOTE — Procedures (Signed)
Physician Interpretation:     [facility] PAP TITRATION INTERPRETATION REPORT   STUDY DATE: 09/02/2023      PATIENT NAME:  Alison [name]         DATE OF BIRTH:  1956/11/13  PATIENT ID:  [id number]    TYPE OF STUDY:  CPAP  READING PHYSICIAN: Huston Foley, MD, PhD SCORING TECHNICIAN: Margaretann Loveless, RPSGT     Referred by: Butch Penny, NP ? History and Indication for Testing: 67 year old female with an underlying medical history of Sjogren's syndrome, sarcoidosis, osteoarthritis, osteopenia, anemia, endometriosis, reflux disease, headaches, hypertension, hyperlipidemia, vitamin D deficiency, and borderline overweight state, who presents for a full night titration study to help optimize her treatment. Her baseline sleep study from 03/18/2023 showed a total AHI of 15.4/hour, REM AHI of 11.5/hour, supine AHI of 78.8/hour and O2 nadir of 70% (during supine NREM sleep). She has been on AutoPap therapy with compliance, average usage of about 5-1/2 hours on the latest compliance download and adequate apnea control with the 95th percentile of pressure at 9.4 cm. She reports ongoing issues maintaining sleep. She has a history of restless leg syndrome for which addressing her iron deficiency was helpful. She presents for a formal titration study. Height: 67.0 in Weight: 163 lb (BMI 25) Neck Size: 0.0 in    MEDICATIONS: Calcium Carbonate, Carboxymethylcellul-Glycerin, Vitamin D, Lunesta, Glucophage, Protonix, Salagen, Requip, Crestor, Diovan  DESCRIPTION: A sleep technologist was in attendance for the duration of the recording.  Data collection, scoring, video monitoring, and reporting were performed in compliance with the AASM Manual for the Scoring of Sleep and Associated Events; (Hypopnea is scored based on the criteria listed in Section VIII D. 1b in the AASM Manual V2.6 using a 4% oxygen desaturation rule or Hypopnea is scored based on the criteria listed in Section VIII  D. 1a in the AASM Manual V2.6 using 3% oxygen desaturation and /or arousal rule).  A physician certified by the American Board of Sleep Medicine reviewed each epoch of the study.   SLEEP CONTINUITY AND SLEEP ARCHITECTURE:  Lights off was at 21:48: and lights on 05:00: (7.2 hours in bed). Total sleep time was 286.5 minutes (35.3% supine;  37.7% lateral;  27.1% prone, 11.3% REM sleep), with a decreased sleep efficiency at 66.4%. Sleep latency was increased at 108.0 minutes.  Of the total sleep time, the percentage of stage N1 sleep was 1.9%, stage N2 sleep was 70.3%, which is increased, stage N3 sleep was 16.4%, which is normal, and REM sleep was 11.3%, which is reduced. Wake after sleep onset (WASO) time accounted for 37 minutes with one longer period of wakefulness and otherwise minimal to mild sleep fragmentation noted.   AROUSAL: There were 26 arousals in total, for an arousal index of 5.4 arousals/hour.  Of these, 2 were identified as respiratory-related arousals (0.4 /h), 0 were PLM-related arousals (0.0 /h), and 30 were non-specific arousals (6.3 /h)  RESPIRATORY MONITORING:  Based on CMS criteria (using a 4% oxygen desaturation rule for scoring hypopneas), there were 0 apneas (0 obstructive; 0 central; 0 mixed), and 15 hypopneas.  Apnea index was 0.0. Hypopnea index was 3.1. The apnea-hypopnea index was 3.1 overall (8.9 supine, 0.0 non-supine; 0.0 REM, 0.0 supine REM). There were 0 respiratory effort-related arousals (RERAs).  The RERA index was 0.0 events/h. Total respiratory disturbance index (RDI) was 3.1 events/h. RDI results showed: supine RDI  8.9 /h; non-supine RDI 0.0 /h; REM RDI 0.0 /h, supine REM RDI 0.0 /h.  Based on AASM criteria (using a 3% oxygen desaturation and /or arousal rule for scoring hypopneas), there were 0 apneas (0 obstructive; 0 central; 0 mixed), and 16 hypopneas. Apnea index was 0.0. Hypopnea index was 3.4. The apnea-hypopnea index was 3.4 overall (9.5 supine, 0.0  non-supine; 0.0 REM, 0.0 supine REM). There were 0 respiratory effort-related arousals (RERAs).  The RERA index was 0.0 events/h. Total respiratory disturbance index (RDI) was 3.4 events/h. RDI results showed: supine RDI  9.5 /h; non-supine RDI 0.0 /h; REM RDI 0.0 /h, supine REM RDI 0.0 /h.  Respiratory events were associated with oxyhemoglobin desaturations (nadir during sleep 86%) from a mean of 94%).  OXIMETRY: Total sleep time spent at, or below 88% was 4.2 minutes, or 1.5% of total sleep time.   BODY POSITION: Duration of total sleep and percent of total sleep in their respective position is as follows: supine 101 minutes (35.3%), non-supine 185.5 minutes (64.7%); right 06 minutes (2.1%), left 102 minutes (35.6%), and prone 77 minutes (27.1%). Total supine REM sleep time was 00 minutes (0.0% of total REM sleep).  LIMB MOVEMENTS: There were 0 periodic limb movements of sleep (0.0/h), of which 0 (0.0/h) were associated with an arousal.  TITRATION DETAILS (SEE ALSO TABLE AT THE END OF THE REPORT):  The patient took a ropinirole upon study started, she did not take [place].  The patient was shown several different interfaces and was subsequently fitted with a small ResMed N30i nasal interface. CPAP was started at a pressure of 6 cm and titrated to a final pressure of 9 cm with EPR of 1. On the final pressure she achieved a total sleep time of 38.5 minutes, residual AHI mildly elevated at 6.23/h, with supine non-REM sleep achieved, O2 nadir 86%.  She had no significant central apneic events, AHI was 3.85/h on a pressure of 7 cm with nonsupine REM sleep achieved.   EEG: Review of the EEG showed no abnormal electrical discharges and symmetrical bihemispheric findings.  ? EKG: The EKG revealed normal sinus rhythm (NSR). The average heart rate during sleep was 72 bpm.  ? AUDIO/VIDEO REVIEW: The audio and video review did not show any abnormal or unusual behaviors, movements, phonations or vocalizations.  The patient took 2 restroom breaks. Snoring was not noted in any significant degree while on PAP therapy.  ? POST-STUDY QUESTIONNAIRE: Post study, the patient indicated, that sleep was the same as usual.  ? IMPRESSION:   1.?Obstructive Sleep Apnea (OSA) 2.?Dysfunctions associated with sleep stages or arousal from sleep ? RECOMMENDATIONS:   1.?This CPAP titration study shows ongoing good results with PAP therapy without any evidence of severe oxygen desaturations or nocturnal hypoxemia. Based on the current titration results, I recommend changing her AutoPap modality to a set pressure (i.e. CPAP) of 10 cm with EPR of 2 with a small interface from ResMed, N30i.  2.?This study shows difficulty with sleep onset but overall mild sleep fragmentation, she achieved all stages of sleep. These are nonspecific findings and per se do not signify an intrinsic sleep disorder or a cause for the patient's sleep-related symptoms. Causes include (but are not limited to) the first night effect of the sleep study, circadian rhythm disturbances, medication effect or an underlying mood disorder or medical problem. No significant PLMs (periodic limb movements of sleep) were noted during this study. The patient did take her ropinirole prior to study start. RLS and PLMs may improve with OSA treatment.  3.?The patient should be cautioned not to drive, work at  heights, or operate dangerous or heavy equipment when tired or sleepy. Review and reiteration of good sleep hygiene measures should be pursued with any patient. 4.?The patient will be seen in follow-up in the sleep clinic at [facility] for discussion of the test results, symptom and treatment compliance review, further management strategies, etc. The patient and the referring provider will be notified of the test results. ? I certify that I have reviewed the entire raw data recording prior to the issuance of this report in accordance with the Standards of Accreditation of the  American Academy of Sleep Medicine (AASM). ? Huston Foley, MD, PhD Medical Director, [facility]) Diplomat, ABPN (Neurology and Sleep)              Technical Report:   [facility] CPAP Summary    General Information  Name: Vollie, Aaron BMI: 25.53 Physician: Huston Foley, MD  ID: [id number] Height: 67.0 in Technician: Margaretann Loveless, RPSGT  Sex: Female Weight: 163.0 lb Record: [id number]  Age: 30 [1956/07/28] Date: 09/02/2023     Medical & Medication History    VERONIA LAPRISE is a 67 y.o. female with a history of OSA on CPAp. Returns today for follow-up. This is the patient's initial CPAP compliance visit. She states that since she has started CPAP she continues to wake up just as frequently during the night. She is not sure that has been helpful at all. She avoids caffeine. States that she typically does not have any trouble falling asleep. She just wakes up frequently and feels like she does not get enough sleep. Patient is very tearful today. She states that she has worked on her sleep hygiene. Tried over-the-counter supplements and nothing seems to help her sleep. Denies any significant stress, anxiety or depression RLS was bad in October 2023- RLS improved with the increase in iron level.Has FU with GP in October. She does not feel that her restless legs is keeping her up at night. The apnea-hypopnea index was 15.4/hour overall (78.8 supine, 12 non-supine; 11.5 REM, 0.0 supine REM).  Calcium Carbonate, Carboxymethylcellul-Glycerin, Vitamin D, Lunesta, Glucophage, Protonix, Salagen, Requip, Crestor, Diovan   Sleep Disorder      Comments   The patient came into the lab for a CPAP titration study. The patient had a PSG on 03/18/23 with our sleep lab. A total AHI 15.4/hr, supine AHI 78.8/hr and O2 nadir of 70%. The patient took Requip prior to bed. Per the patient she does not take Lunesta often. The  patient was fitted with a ResMed N30i (nasal) size Small. The mask was a good fit to face, and the patient tolerate the mask well. CPAP was started at [facility] with no EPR. CPAP was increased to 9cmH2O with EPR of 1. The patient had two restroom breaks. EKG kept in NSR. No snoring noted. All sleep stages witnessed. Respiratory events scored with a 4% desat. Slept lateral, supine and prone.    CPAP start time: 09:49:12 PM CPAP end time: 05:00:16 AM   Time Total Supine Side Prone Upright  Recording (TRT) 7h 11.50m 3h 8.67m 2h 41.65m 1h 21.57m 0h 0.17m  Sleep (TST) 4h 46.22m 1h 41.32m 1h 48.33m 1h 17.25m 0h 0.62m   Latency N1 N2 N3 REM Onset Per. Slp. Eff.  Actual 0h 0.98m 0h 1.74m 0h 10.7m 3h 8.64m 1h 47.28m 1h 47.72m 66.47%   Stg Dur Wake N1 N2 N3 REM  Total 144.5 5.5 201.5 47.0 32.5  Supine 87.5 1.5  86.0 13.5 0.0  Side 53.0 2.5 74.5 31.0 0.0  Prone 4.0 1.5 41.0 2.5 32.5  Upright 0.0 0.0 0.0 0.0 0.0   Stg % Wake N1 N2 N3 REM  Total 33.5 1.9 70.3 16.4 11.3  Supine 20.3 0.5 30.0 4.7 0.0  Side 12.3 0.9 26.0 10.8 0.0  Prone 0.9 0.5 14.3 0.9 11.3  Upright 0.0 0.0 0.0 0.0 0.0     Apnea Summary Sub Supine Side Prone Upright  Total 0 Total 0 0 0 0 0    REM 0 0 0 0 0    NREM 0 0 0 0 0  Obs 0 REM 0 0 0 0 0    NREM 0 0 0 0 0  Mix 0 REM 0 0 0 0 0    NREM 0 0 0 0 0  Cen 0 REM 0 0 0 0 0    NREM 0 0 0 0 0   Rera Summary Sub Supine Side Prone Upright  Total 0 Total 0 0 0 0 0    REM 0 0 0 0 0    NREM 0 0 0 0 0   Hypopnea Summary Sub Supine Side Prone Upright  Total 16 Total 16 16 0 0 0    REM 0 0 0 0 0    NREM 16 16 0 0 0   4% Hypopnea Summary Sub Supine Side Prone Upright  Total (4%) 15 Total 15 15 0 0 0    REM 0 0 0 0 0    NREM 15 15 0 0 0     AHI Total Obs Mix Cen  3.35 Apnea 0.00 0.00 0.00 0.00   Hypopnea 3.35 -- -- --  3.14 Hypopnea (4%) 3.14 -- -- --    Total Supine Side Prone Upright  Position AHI 3.35 9.50 0.00 0.00 0.00  REM AHI 0.00   NREM AHI 3.78   Position RDI 3.35 9.50 0.00  0.00 0.00  REM RDI 0.00   NREM RDI 3.78    4% Hypopnea Total Supine Side Prone Upright  Position AHI (4%) 3.14 8.91 0.00 0.00 0.00  REM AHI (4%) 0.00   NREM AHI (4%) 3.54   Position RDI (4%) 3.14 8.91 0.00 0.00 0.00  REM RDI (4%) 0.00   NREM RDI (4%) 3.54    Desaturation Information  <100% <90% <80% <70% <60% <50% <40%  Supine 51 8 0 0 0 0 0  Side 13 0 0 0 0 0 0  Prone 5 0 0 0 0 0 0  Upright 0 0 0 0 0 0 0  Total 69 8 0 0 0 0 0  Desaturation threshold setting: 3% Minimum desaturation setting: 10 seconds SaO2 nadir: 82% The longest event was a 30 sec obstructive Hypopnea with a minimum SaO2 of 92%. The lowest SaO2 was 86% associated with a 19 sec obstructive Hypopnea. EKG Rates EKG Avg Max Min  Awake 81 102 68  Asleep 72 88 64  EKG Events: Tachycardia Awakening/Arousal Information # of Awakenings 11  Wake after sleep onset 37.2m  Wake after persistent sleep 37.15m   Arousal Assoc. Arousals Index  Apneas 0 0.0  Hypopneas 2 0.4  Leg Movements 0 0.0  Snore 0.0 0.0  PTT Arousals 0 0.0  Spontaneous 30 6.3  Total 32 6.7  Myoclonus Information PLMS LMs Index  Total LMs during PLMS 0 0.0  LMs w/ Microarousals 0 0.0   LM LMs Index  w/ Microarousal 0 0.0  w/ Awakening  0 0.0  w/ Resp Event 0 0.0  Spontaneous 0 0.0  Total 0 0.0      Titration Table:  [facility] CPAP/Bilevel Report    General Information  Name: Awilda, Covin BMI: 25 Physician: Huston Foley  ID: [id number] Height: 67 in Technician: Margaretann Loveless  Sex: Female Weight: 163 lb Record: [id number]  Age: 32 [Apr 11, 1956] Date: 09/02/2023 Scorer: Zenon Mayo Fields   Recommended Settings IPAP: N/A cmH20 EPAP: N/A cmH2O AHI: N/A AHI (4%): N/A   Pressure IPAP/EPAP 00 06 07 09   O2 Vol 0.0 0.0 0.0 0.0  Time TRT 0.69m 217.54m 173.76m 41.46m   TST 0.28m 92.19m 156.70m 38.33m  Sleep Stage % Wake 0.0 57.7 9.8 6.1   % REM 0.0 0.0 20.8 0.0   % N1 0.0 3.3 1.6 0.0   % N2 0.0 70.1  63.1 100.0   % N3 0.0 26.6 14.4 0.0  Respiratory Total Events 0 2 10 4    Obs. Apn. 0 0 0 0   Mixed Apn. 0 0 0 0   Cen. Apn. 0 0 0 0   Hypopneas 0 2 10 4    AHI 0.00 1.30 3.85 6.23   Supine AHI 0.00 3.64 16.90 7.38   Prone AHI 0.00 0.00 0.00 0.00   Side AHI 0.00 0.00 0.00 0.00  Respiratory (4%) Hypopneas (4%) 0.00 1.00 10.00 4.00   AHI (4%) 0.00 0.65 3.85 6.23   Supine AHI (4%) 0.00 1.82 16.90 7.38   Prone AHI (4%) 0.00 0.00 0.00 0.00   Side AHI (4%) 0.00 0.00 0.00 0.00  Desat Profile <= 90% 0.78m 13.65m 11.72m 0.1m   <= 80% 0.42m 7.35m 2.33m 0.75m   <= 70% 0.78m 7.85m 2.73m 0.11m   <= 60% 0.59m 7.13m 2.25m 0.21m  Arousal Index Apnea 0.0 0.0 0.0 0.0   Hypopnea 0.0 0.0 0.8 0.0   LM 0.0 0.0 0.0 0.0   Spontaneous 0.0 3.9 4.6 18.7

## 2023-09-05 NOTE — Addendum Note (Signed)
Addended by: Enedina Finner on: 09/05/2023 10:25 AM   Modules accepted: Orders

## 2023-09-09 ENCOUNTER — Telehealth: Payer: Self-pay

## 2023-09-09 NOTE — Telephone Encounter (Addendum)
I contacted pt regarding titration study. Informed her that her the study showed that she had good results with CPAP therapy.  NP would like to change her to a set pressure of 10 cm of water with EPR at 2. Also advised that she had difficulty with sleep onset but overall mild sleep fragmentation.  She was able to achieve all stages of sleep. She stated she she is still having trouble sleeping. She gets about 1-1.5 hrs of consistent sleep and will wake up frequently throughout the night.  She mentioned her PCP asked if she wanted to try Ambien to help her sleep but they wanted to get these results back first. Pt declined noticing a difference since cpap settings have been change.

## 2023-09-09 NOTE — Telephone Encounter (Signed)
-----   Message from [facility] sent at 09/05/2023 10:25 AM EDT ----- Please call and let patient know that her CPAP titration showed that she had good results with CPAP therapy.  We will change her to a set pressure of 10 cm of water with EPR at 2.  Also advised that she had difficulty with sleep onset but overall mild sleep fragmentation.  She was able to achieve all stages of sleep.  Please inquire if the patient is still having trouble sleeping through the night?  Hopefully changing her to a set pressure would be beneficial

## 2023-09-11 NOTE — Telephone Encounter (Signed)
Order for pressure change sent to [facility].

## 2023-09-11 NOTE — Telephone Encounter (Signed)
I called the patient and was unable to reach her. I LVM (ok per DPR) advising  that regarding her question about Ambien, Dr. Frances Furbish does not recommend that medication as she has a high risk for side effects.  Some examples of the side effects are Sleepwalking, amnesia, grogginess, balance problem, impaired driving, fall risk, addiction.  I advised the patient that Dr. Frances Furbish recommends she try melatonin up to 10 mg about an hour before bedtime.  Dr. Frances Furbish sees the patient has a prescription for Lunesta on her chart as well.  She does recommend the patient talk to primary care about chronic insomnia and a possible referral to a psychologist who can do cognitive behavioral therapy for insomnia.  I provided the patient with their office hours and our phone number.  She is welcome to give [place] a call back if she has further questions or she can respond to the MyChart message I will send her as well.

## 2023-09-11 NOTE — Telephone Encounter (Signed)
I do not recommend Ambien as she has high risk for side effects.  I recommend she try melatonin up to 10 mg about an hour before bedtime.  I see a prescription for Lunesta on her chart as well.  I recommend that she talk to primary care about chronic insomnia and a possible referral to a psychologist to consider cognitive behavioral therapy for insomnia.

## 2023-09-17 ENCOUNTER — Ambulatory Visit: Payer: Medicare HMO | Admitting: Rheumatology

## 2023-09-17 DIAGNOSIS — Z0100 Encounter for examination of eyes and vision without abnormal findings: Secondary | ICD-10-CM | POA: Diagnosis not present

## 2023-09-17 NOTE — Telephone Encounter (Signed)
[facility] confirmed receipt of order.

## 2023-09-27 DIAGNOSIS — G4733 Obstructive sleep apnea (adult) (pediatric): Secondary | ICD-10-CM | POA: Diagnosis not present

## 2023-09-27 NOTE — Progress Notes (Signed)
Office Visit Note  Patient: Alison [name]             Date of Birth: 07-11-1956           MRN: [id number]             PCP: Richmond Campbell., PA-C Referring: Richmond Campbell., PA-C Visit Date: 10/11/2023 Occupation: @GUAROCC @  Subjective:  Dry mouth and dry eyes  History of Present Illness: Alison [name] is a 67 y.o. female with sarcoidosis, Sjogren's, osteoarthritis and osteoporosis.  Patient states she had Reclast infusion in September 2020 for which she tolerated well.  She has been taking calcium and vitamin D and exercising on a regular basis.  She has dry mouth and dry eye symptoms which are manageable.  She states she uses CPAP with the humidifier which has been very helpful in controlling her symptoms.  She denies any shortness of breath.  She states she has been released by the pulmonologist and does not require follow-up care.  She continues to have some stiffness in her hands which is manageable.    Activities of Daily Living:  Patient reports morning stiffness for less than 1 minute.   Patient Denies nocturnal pain.  Difficulty dressing/grooming: Denies Difficulty climbing stairs: Denies Difficulty getting out of chair: Denies Difficulty using hands for taps, buttons, cutlery, and/or writing: Denies  Review of Systems  Constitutional:  Positive for fatigue.  HENT:  Positive for mouth dryness. Negative for mouth sores.   Eyes:  Positive for dryness.  Respiratory:  Negative for shortness of breath.   Cardiovascular:  Negative for chest pain and palpitations.  Gastrointestinal:  Negative for blood in stool, constipation and diarrhea.  Endocrine: Negative for increased urination.  Genitourinary:  Negative for involuntary urination.  Musculoskeletal:  Positive for morning stiffness. Negative for joint pain, gait problem, joint pain, joint swelling, myalgias, muscle weakness, muscle tenderness and myalgias.  Skin:  Negative for color change, rash, hair loss and  sensitivity to sunlight.  Allergic/Immunologic: Negative for susceptible to infections.  Neurological:  Negative for dizziness and headaches.  Hematological:  Negative for swollen glands.  Psychiatric/Behavioral:  Positive for sleep disturbance. Negative for depressed mood. The patient is not nervous/anxious.     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   At risk for coronary artery disease 04/25/2011   Endometriosis    Hypertension    Hyperlipidemia    RLS (restless legs syndrome)     Past Medical History:  Diagnosis Date   Anemia    hx of due to heavy menstral cycles   Chest pain    Endometriosis    GERD (gastroesophageal reflux disease)    Headache(784.0)    occassional headaches   Hiatal hernia    Hyperlipidemia    Hypertension    Inflammatory arthritis    Mediastinal adenopathy 02/01/2012   PET HOT, High ACE level, EBUS 02/26/12   RLS (restless legs syndrome)    Sleep apnea    moderate- on CPAP   Vitamin D deficiency     Family History  Problem Relation Age of Onset   Asthma Mother    Diabetes Mother  and sister, MGM   Heart disease Father    Heart attack Father    Hypertension Father    Alzheimer's disease Father    Cancer Maternal Grandfather    Hypertension Paternal Grandmother    Sleep apnea Neg Hx    Past Surgical History:  Procedure Laterality Date   CATARACT EXTRACTION, BILATERAL Bilateral 06/2023   CESAREAN SECTION     ENDOMETRIAL BIOPSY     FINGER SURGERY     x2   TONSILLECTOMY     TUMOR REMOVAL     gastric schwannoma   Social History   Social History Narrative   Not on file    Immunization History  Administered Date(s) Administered   Influenza Split 09/03/2011, 10/03/2012   Influenza, Quadrivalent, Recombinant, Inj, Pf 08/14/2019   Moderna Sars-Covid-2 Vaccination 01/29/2020, 02/26/2020, 09/02/2020   Pneumococcal Polysaccharide-23 01/04/2012   Zoster Recombinant(Shingrix) 06/20/2018, 03/07/2019     Objective: Vital Signs: BP 120/83 (BP Location: Left Arm, Patient Position: Sitting, Cuff Size: Normal)   Pulse 82   Resp 13   Ht 5' 6.5" (1.689 m)   Wt 162 lb 3.2 oz (73.6 kg)   BMI 25.79 kg/m    Physical Exam Vitals and nursing note reviewed.  Constitutional:      Appearance: She is well-developed.  HENT:     Head: Normocephalic and atraumatic.  Eyes:     Conjunctiva/sclera: Conjunctivae normal.  Cardiovascular:     Rate and Rhythm: Normal rate and regular rhythm.     Heart sounds: Normal heart sounds.  Pulmonary:     Effort: Pulmonary effort is normal.     Breath sounds: Normal breath sounds.  Abdominal:     General: Bowel sounds are normal.     Palpations: Abdomen is soft.  Musculoskeletal:     Cervical back: Normal range of motion.  Lymphadenopathy:     Cervical: No cervical adenopathy.  Skin:    General: Skin is warm and dry.     Capillary Refill: Capillary refill takes less than 2 seconds.  Neurological:     Mental Status: She is alert and oriented to person, place, and time.  Psychiatric:        Behavior: Behavior normal.      Musculoskeletal Exam: Cervical, thoracic and lumbar spine were in good range of motion without any discomfort.  Her shoulders, elbows, wrist joints, MCPs PIPs and DIPs with good range of motion with no synovitis.  Bilateral PIP and DIP thickening was noted.  Hip joints and knee joints in good range of motion without any discomfort or swelling.  There was no tenderness over ankles or MTPs.  CDAI Exam: CDAI Score: -- Patient Global: --; Provider Global: -- Swollen: --; Tender: -- Joint Exam 10/11/2023   No  joint exam has been documented for this visit   There is currently no information documented on the homunculus. Go to the Rheumatology activity and complete the homunculus joint exam.  Investigation: No additional findings.  Imaging: No results found.  Recent Labs: Lab Results  Component Value Date   WBC 8.7 04/02/2023   HGB 11.6 (L) 04/02/2023   PLT 371 04/02/2023   NA 138 07/16/2023   K 3.8 07/16/2023   CL 98 07/16/2023   CO2 28 07/16/2023   GLUCOSE 134 (H) 07/16/2023   BUN 7 07/16/2023   CREATININE 0.60 07/16/2023   BILITOT 0.7 07/16/2023   ALKPHOS 100 05/31/2017   AST 27 07/16/2023   ALT 35 (H) 07/16/2023   PROT 6.9  07/16/2023   ALBUMIN 4.2 05/31/2017   CALCIUM 9.7 07/16/2023   GFRAA 108 06/25/2019    Speciality Comments: No specialty comments available.  Procedures:  No procedures performed Allergies: Patient has no known allergies.   Assessment / Plan:     Visit Diagnoses: Sarcoidosis, lung, stage I - +ACE, hilar lymphadenopathy Chest x-ray 2019 was negative: Patient denies any shortness of breath.  She states she was discharged by her pulmonologist to return on as needed basis.  Hilar lymphadenopathy  High risk medication use - She is not currently taking immunosuppressive agents.  Discontinued methotrexate in February 2020 due to elevated LFTs.  Primary osteoarthritis of both hands-she is to have some stiffness in her hands.  Joint protection muscle strengthening was discussed.  Sjogren's syndrome with other organ involvement (HCC) - Positive ANA and +Ro: Her symptoms have improved since she has been using CPAP.  She uses eyedrops.  Age-related osteoporosis without current pathological fracture - DEXA 05/13/2023: AP spine BMD 0.777 with T-score -2.5. -13% decrease Right total femur and -14% decrease in Left total femur.  Her last Reclast infusion was on August 22, 2023.  She tolerated the infusion well without any side effects.  She has been taking calcium  and vitamin D.  We will repeat Reclast infusion in September 2025.  DEXA scan will be repeated in 2026.  Medication monitoring encounter - Tolerated Fosamax from Nov 2017- July 2019. Reclast IV 08/22/2023  Vitamin D deficiency-patient has been taking vitamin D supplement.  History of hyperlipidemia  History of hypertension  RLS (restless legs syndrome)  Primary insomnia  Orders: No orders of the defined types were placed in this encounter.  No orders of the defined types were placed in this encounter.    Follow-Up Instructions: Return in about 9 months (around 07/10/2024) for Sjogren's, Sarcoidosis, Osteoarthritis, Osteoporosis.   Pollyann Savoy, MD  Note - This record has been created using Animal nutritionist.  Chart creation errors have been sought, but may not always  have been located. Such creation errors do not reflect on  the standard of medical care.

## 2023-10-11 ENCOUNTER — Encounter: Payer: Self-pay | Admitting: Rheumatology

## 2023-10-11 ENCOUNTER — Ambulatory Visit: Payer: Medicare HMO | Attending: Rheumatology | Admitting: Rheumatology

## 2023-10-11 VITALS — BP 120/83 | HR 82 | Resp 13 | Ht 66.5 in | Wt 162.2 lb

## 2023-10-11 DIAGNOSIS — Z79899 Other long term (current) drug therapy: Secondary | ICD-10-CM

## 2023-10-11 DIAGNOSIS — R590 Localized enlarged lymph nodes: Secondary | ICD-10-CM

## 2023-10-11 DIAGNOSIS — D860 Sarcoidosis of lung: Secondary | ICD-10-CM | POA: Diagnosis not present

## 2023-10-11 DIAGNOSIS — Z8679 Personal history of other diseases of the circulatory system: Secondary | ICD-10-CM | POA: Diagnosis not present

## 2023-10-11 DIAGNOSIS — G2581 Restless legs syndrome: Secondary | ICD-10-CM | POA: Diagnosis not present

## 2023-10-11 DIAGNOSIS — M19042 Primary osteoarthritis, left hand: Secondary | ICD-10-CM

## 2023-10-11 DIAGNOSIS — E559 Vitamin D deficiency, unspecified: Secondary | ICD-10-CM | POA: Diagnosis not present

## 2023-10-11 DIAGNOSIS — F5101 Primary insomnia: Secondary | ICD-10-CM

## 2023-10-11 DIAGNOSIS — M19041 Primary osteoarthritis, right hand: Secondary | ICD-10-CM

## 2023-10-11 DIAGNOSIS — M3509 Sicca syndrome with other organ involvement: Secondary | ICD-10-CM

## 2023-10-11 DIAGNOSIS — Z5181 Encounter for therapeutic drug level monitoring: Secondary | ICD-10-CM

## 2023-10-11 DIAGNOSIS — Z8639 Personal history of other endocrine, nutritional and metabolic disease: Secondary | ICD-10-CM

## 2023-10-11 DIAGNOSIS — D509 Iron deficiency anemia, unspecified: Secondary | ICD-10-CM | POA: Diagnosis not present

## 2023-10-11 DIAGNOSIS — M810 Age-related osteoporosis without current pathological fracture: Secondary | ICD-10-CM | POA: Diagnosis not present

## 2023-10-28 DIAGNOSIS — G4733 Obstructive sleep apnea (adult) (pediatric): Secondary | ICD-10-CM | POA: Diagnosis not present

## 2023-11-27 DIAGNOSIS — G4733 Obstructive sleep apnea (adult) (pediatric): Secondary | ICD-10-CM | POA: Diagnosis not present

## 2023-12-19 ENCOUNTER — Encounter: Payer: Self-pay | Admitting: *Deleted

## 2023-12-19 NOTE — Progress Notes (Signed)
PATIENT: Alison [name] DOB: 07-03-1956  REASON FOR VISIT: follow up HISTORY FROM: patient PRIMARY NEUROLOGIST: Dr. Frances Furbish  Virtual Visit via Video Note  I connected with Alison [name] on 12/20/23 at 10:30 AM EST by a video enabled telemedicine application located remotely at [facility] and verified that I am speaking with the correct person using two identifiers who was located at their own home.   I discussed the limitations of evaluation and management by telemedicine and the availability of in person appointments. The patient expressed understanding and agreed to proceed.   PATIENT: Alison [name] DOB: Sep 05, 1956  REASON FOR VISIT: follow up HISTORY FROM: patient  HISTORY OF PRESENT ILLNESS: Today 12/20/23:  Alison [name] is a 68 y.o. female with a history of OSA om CPAP. Returns today for follow-up.  She continues to struggle with staying asleep at night.  She states that she typically does not put the CPAP on when she initially goes to sleep because she struggles falling asleep with it on.  She typically wakes up around 12 PM and then will put the CPAP on and try to sleep the rest of the night with the CPAP.  Typically only gets about 4-1/2 more hours of sleep.  Dr. Frances Furbish recommended that she follow-up with her PCP for possible referral to psychology for ongoing insomnia.  She states that she has not done this yet but she does have an appointment next month with PCP and will discuss       HISTORY 08/08/23:   Alison [name] is a 68 y.o. female with a history of OSA on CPAp. Returns today for follow-up.  This is the patient's initial CPAP compliance visit.  She states that since she has started CPAP she continues to wake up just as frequently during the night.  She is not sure that has been helpful at all.  She avoids caffeine.  States that she typically does not have any trouble falling asleep.  She just wakes up frequently and feels like she does not  get enough sleep.  Patient is very tearful today.  She states that she has worked on her sleep hygiene.  Tried over-the-counter supplements and nothing seems to help her sleep.  Denies any significant stress, anxiety or depression   RLS was bad in October 2023- RLS improved with the increase in iron level.Has FU with GP in October.  She does not feel that her restless legs is keeping her up at night  REVIEW OF SYSTEMS: Out of a complete 14 system review of symptoms, the patient complains only of the following symptoms, and all other reviewed systems are negative.  ALLERGIES: No Known Allergies  HOME MEDICATIONS: Outpatient Medications Prior to Visit  Medication Sig Dispense Refill   calcium carbonate 1250 MG capsule Take 1,250 mg by mouth daily.     Carboxymethylcellul-Glycerin (LUBRICATING EYE DROPS OP) Apply to eye as needed.     cholecalciferol (VITAMIN D) 1000 UNITS tablet Take 1,000 Units by mouth daily.     eszopiclone (LUNESTA) 2 MG TABS tablet Take 2 mg by mouth as needed. (Patient not taking: Reported on 10/11/2023)     ferrous sulfate 325 (65 FE) MG tablet Take by mouth.     glipiZIDE (GLUCOTROL XL) 2.5 MG 24 hr tablet Take 1 tablet by mouth daily.     metFORMIN (GLUCOPHAGE-XR) 500 MG 24 hr tablet Take 500 mg by mouth 2 (two) times daily.     pantoprazole (  PROTONIX) 40 MG tablet Take 1 tablet by mouth 2 (two) times daily.     pilocarpine (SALAGEN) 5 MG tablet Take 1 tablet (5 mg total) by mouth 3 (three) times daily as needed. (Patient taking differently: Take 5 mg by mouth as needed.) 90 tablet 2   rOPINIRole (REQUIP) 0.5 MG tablet Take 2 mg by mouth at bedtime.     rosuvastatin (CRESTOR) 10 MG tablet Take 10 mg by mouth daily.     triamcinolone cream (KENALOG) 0.1 % Apply topically. (Patient not taking: Reported on 07/16/2023)     valsartan-hydrochlorothiazide (DIOVAN-HCT) 320-25 MG per tablet Take 0.5 tablets by mouth daily.     No facility-administered medications prior to  visit.    PAST MEDICAL HISTORY: Past Medical History:  Diagnosis Date   Anemia    hx of due to heavy menstral cycles   Chest pain    Endometriosis    GERD (gastroesophageal reflux disease)    Headache(784.0)    occassional headaches   Hiatal hernia    Hyperlipidemia    Hypertension    Inflammatory arthritis    Mediastinal adenopathy 02/01/2012   PET HOT, High ACE level, EBUS 02/26/12   RLS (restless legs syndrome)    Sleep apnea    moderate- on CPAP   Vitamin D deficiency     PAST SURGICAL HISTORY: Past Surgical History:  Procedure Laterality Date   CATARACT EXTRACTION, BILATERAL Bilateral 06/2023   CESAREAN SECTION     ENDOMETRIAL BIOPSY     FINGER SURGERY     x2   TONSILLECTOMY     TUMOR REMOVAL     gastric schwannoma    FAMILY HISTORY: Family History  Problem Relation Age of Onset   Asthma Mother    Diabetes Mother        and sister, MGM   Heart disease Father    Heart attack Father    Hypertension Father    Alzheimer's disease Father    Cancer Maternal Grandfather    Hypertension Paternal Grandmother    Sleep apnea Neg Hx     SOCIAL HISTORY: Social History   Socioeconomic History   Marital status: Married    Spouse name: Not on file   Number of children: 1   Years of education: Not on file   Highest education level: Not on file  Occupational History   Occupation: Warden/ranger  Tobacco Use   Smoking status: Never    Passive exposure: Never   Smokeless tobacco: Never  Vaping Use   Vaping status: Never Used  Substance and Sexual Activity   Alcohol use: Yes    Comment: social   Drug use: Never   Sexual activity: Not on file  Other Topics Concern   Not on file  Social History Narrative   Not on file   Social Drivers of Health   Financial Resource Strain: Not on file  Food Insecurity: Low Risk  (08/26/2023)   Received from [facility]   Hunger Vital Sign    Worried About Running Out of Food in the Last Year: Never true    Ran Out  of Food in the Last Year: Never true  Transportation Needs: No Transportation Needs (08/26/2023)   Received from [facility]    In the past 12 months, has lack of reliable transportation kept you from medical appointments, meetings, work or from getting things needed for daily living? : No  Physical Activity: Not on file  Stress: Not on  file  Social Connections: Not on file  Intimate Partner Violence: Not on file      PHYSICAL EXAM Generalized: Well developed, in no acute distress   Neurological examination  Mentation: Alert oriented to time, place, history taking. Follows all commands speech and language fluent Cranial nerve II-XII: Facial symmetry noted  DIAGNOSTIC DATA (LABS, IMAGING, TESTING) - I reviewed patient records, labs, notes, testing and imaging myself where available.  Lab Results  Component Value Date   WBC 8.7 04/02/2023   HGB 11.6 (L) 04/02/2023   HCT 36.7 04/02/2023   MCV 80.0 04/02/2023   PLT 371 04/02/2023      Component Value Date/Time   NA 138 07/16/2023 0933   K 3.8 07/16/2023 0933   CL 98 07/16/2023 0933   CO2 28 07/16/2023 0933   GLUCOSE 134 (H) 07/16/2023 0933   BUN 7 07/16/2023 0933   CREATININE 0.60 07/16/2023 0933   CALCIUM 9.7 07/16/2023 0933   PROT 6.9 07/16/2023 0933   ALBUMIN 4.2 05/31/2017 1130   AST 27 07/16/2023 0933   ALT 35 (H) 07/16/2023 0933   ALKPHOS 100 05/31/2017 1130   BILITOT 0.7 07/16/2023 0933   GFRNONAA 93 06/25/2019 1056   GFRAA 108 06/25/2019 1056      ASSESSMENT AND PLAN 68 y.o. year old female  has a past medical history of Anemia, Chest pain, Endometriosis, GERD (gastroesophageal reflux disease), Headache(784.0), Hiatal hernia, Hyperlipidemia, Hypertension, Inflammatory arthritis, Mediastinal adenopathy (02/01/2012), RLS (restless legs syndrome), Sleep apnea, and Vitamin D deficiency. here with:  OSA on CPAP  CPAP compliance good Residual AHI is good Encouraged patient to continue using  CPAP nightly and > 4 hours each night F/U in 1 year or sooner if needed  Alison Penny, MSN, NP-C 12/20/2023, 10:23 AM [facility] [address] [place], [place] [address] [phone]

## 2023-12-20 ENCOUNTER — Telehealth (INDEPENDENT_AMBULATORY_CARE_PROVIDER_SITE_OTHER): Payer: Medicare HMO | Admitting: Adult Health

## 2023-12-20 DIAGNOSIS — G4733 Obstructive sleep apnea (adult) (pediatric): Secondary | ICD-10-CM | POA: Diagnosis not present

## 2023-12-23 NOTE — Progress Notes (Signed)
ESS 16 per pt report.

## 2023-12-24 DIAGNOSIS — G4733 Obstructive sleep apnea (adult) (pediatric): Secondary | ICD-10-CM | POA: Diagnosis not present

## 2023-12-28 DIAGNOSIS — G4733 Obstructive sleep apnea (adult) (pediatric): Secondary | ICD-10-CM | POA: Diagnosis not present

## 2024-01-17 ENCOUNTER — Encounter: Payer: Self-pay | Admitting: Adult Health

## 2024-01-28 DIAGNOSIS — G4733 Obstructive sleep apnea (adult) (pediatric): Secondary | ICD-10-CM | POA: Diagnosis not present

## 2024-01-30 DIAGNOSIS — L570 Actinic keratosis: Secondary | ICD-10-CM | POA: Diagnosis not present

## 2024-01-30 DIAGNOSIS — L821 Other seborrheic keratosis: Secondary | ICD-10-CM | POA: Diagnosis not present

## 2024-01-30 DIAGNOSIS — L578 Other skin changes due to chronic exposure to nonionizing radiation: Secondary | ICD-10-CM | POA: Diagnosis not present

## 2024-01-30 DIAGNOSIS — D229 Melanocytic nevi, unspecified: Secondary | ICD-10-CM | POA: Diagnosis not present

## 2024-01-30 DIAGNOSIS — L814 Other melanin hyperpigmentation: Secondary | ICD-10-CM | POA: Diagnosis not present

## 2024-02-20 DIAGNOSIS — I10 Essential (primary) hypertension: Secondary | ICD-10-CM | POA: Diagnosis not present

## 2024-02-20 DIAGNOSIS — E7800 Pure hypercholesterolemia, unspecified: Secondary | ICD-10-CM | POA: Diagnosis not present

## 2024-02-20 DIAGNOSIS — E119 Type 2 diabetes mellitus without complications: Secondary | ICD-10-CM | POA: Diagnosis not present

## 2024-02-24 DIAGNOSIS — Z862 Personal history of diseases of the blood and blood-forming organs and certain disorders involving the immune mechanism: Secondary | ICD-10-CM | POA: Diagnosis not present

## 2024-02-24 DIAGNOSIS — E7800 Pure hypercholesterolemia, unspecified: Secondary | ICD-10-CM | POA: Diagnosis not present

## 2024-02-24 DIAGNOSIS — J302 Other seasonal allergic rhinitis: Secondary | ICD-10-CM | POA: Diagnosis not present

## 2024-02-24 DIAGNOSIS — R0989 Other specified symptoms and signs involving the circulatory and respiratory systems: Secondary | ICD-10-CM | POA: Diagnosis not present

## 2024-02-24 DIAGNOSIS — G2581 Restless legs syndrome: Secondary | ICD-10-CM | POA: Diagnosis not present

## 2024-02-24 DIAGNOSIS — K219 Gastro-esophageal reflux disease without esophagitis: Secondary | ICD-10-CM | POA: Diagnosis not present

## 2024-02-24 DIAGNOSIS — I10 Essential (primary) hypertension: Secondary | ICD-10-CM | POA: Diagnosis not present

## 2024-02-24 DIAGNOSIS — R0689 Other abnormalities of breathing: Secondary | ICD-10-CM | POA: Diagnosis not present

## 2024-02-24 DIAGNOSIS — D508 Other iron deficiency anemias: Secondary | ICD-10-CM | POA: Diagnosis not present

## 2024-02-24 DIAGNOSIS — E119 Type 2 diabetes mellitus without complications: Secondary | ICD-10-CM | POA: Diagnosis not present

## 2024-02-24 DIAGNOSIS — D509 Iron deficiency anemia, unspecified: Secondary | ICD-10-CM | POA: Diagnosis not present

## 2024-02-24 DIAGNOSIS — M810 Age-related osteoporosis without current pathological fracture: Secondary | ICD-10-CM | POA: Diagnosis not present

## 2024-02-24 DIAGNOSIS — M3500 Sicca syndrome, unspecified: Secondary | ICD-10-CM | POA: Diagnosis not present

## 2024-02-24 DIAGNOSIS — F5102 Adjustment insomnia: Secondary | ICD-10-CM | POA: Diagnosis not present

## 2024-02-24 NOTE — Progress Notes (Signed)
 Subjective  Patient ID: Alison [name] is a 68 y.o. female. Medication check / chronic ongoing care  Chief Complaint  Patient presents with  . Diabetes    Copy of labs printed for patient.  SABRA GERD  . Hyperlipidemia  . Hypertension  . Insomnia  . 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 She has type 2 diabetes mellitus. Disease course: first blood work since on Metformin  Pertinent negatives for diabetes include no blurred vision, no chest pain and no fatigue. Pertinent negatives for hypoglycemia complications include no blackouts. Symptoms are stable. Current diabetic treatment includes oral agent (monotherapy). She is compliant with treatment all of the time. She is following a generally healthy diet. She participates in exercise daily. An ACE inhibitor/angiotensin II receptor blocker is being taken. She does not see a podiatrist.Eye exam is current ([facility] ).  Lab Results  Component Value Date   HGBA1C 6.7 (H) 02/20/2024      Hyperlipidemia This is a chronic problem. The current episode started more than 1 year ago. The problem is resistant. Recent lipid tests were reviewed and are variable. Pertinent negatives include no chest pain or shortness of breath. Current antihyperlipidemic treatment includes statins. There are no compliance problems.  Risk factors for coronary artery disease include diabetes mellitus and hypertension. -patient reports no issues with Crestor 10 mg  Lab Results  Component Value Date   CHOL 136 02/20/2024   CHOL 137 08/21/2023   CHOL 127 02/12/2023   Lab Results  Component Value Date   HDL 46 (L) 02/20/2024   HDL 50 (L) 08/21/2023   HDL 48 (L) 02/12/2023   Lab Results   Component Value Date   LDLCALC 69 02/20/2024   LDLCALC 64 08/21/2023   LDLCALC 56 02/12/2023   Lab Results  Component Value Date   TRIG 119 02/20/2024   TRIG 146 08/21/2023   TRIG 144 02/12/2023   No results found for: CHOLHDL    Sarcoidosis/Sjogrens  - patient is seeing Dr Monna every 6 months - blood work every 6 months due to elevated liver functions - she is off  Methotrexate  and Fosamax  and Salagen -  -  She does not follow up with pulmonology any more - she was told her sarcoid was treated and cured       Sleep Apnea - patient was started on Cpap and she wakes up many times throughout the night she will be having a new sleep study       Restless Leg Syndrome - patient now take 3 requip at night and feels like it is working well - this does help her relax but has not been beneficial for sleep - she still waits every few hours at night        Hypertension  This is a chronic problem. The current episode started more than 1 year ago. The problem is unchanged. The problem is controlled. Pertinent negatives include no chest pain or shortness of breath. Treatments tried: valsartan and Hct. The current treatment provides significant improvement. There are no compliance problems. BP Readings from Last 3 Encounters:  02/24/24 110/70  08/27/23 128/82  02/12/23 118/80        Gastroesophageal Reflux  She complains of coughing (occasionally due to Sarcoid ). She reports no chest pain, no heartburn or no sore throa. She has tried a PPI for the symptoms.( she is currently taking one pantoprazole per day  )  The treatment provided some relief. Past procedures do not include a UGI.  The pantoprazole has helped she usually will take one pill per day  Last endoscopy and colonoscopy were last year    Seasonal ALlergies - patient has on hand and uses on occasion   Anemia - this has been a newer issue - she is taking once per day  - her hemoglobin was 10 but now 12 at last visit - her  last colonoscopy was in 6/24--she had endoscopy and colonoscopy - she is taking one iron supplement per day now    Osteoporosis - just had reclast  infusion - she did very well    Insomnia - this is an ongoing issue she has tried many sleep aides without success - she does have sleep apnea but face mask is difficult to use     Past Medical History:  Diagnosis Date  . Diabetes mellitus (CMD)   . GERD (gastroesophageal reflux disease)   . Hypertension   . Osteoporosis   . Sarcoid   . Sleep apnea in adult    Family History  Problem Relation Name Age of Onset  . Heart disease Father    . Alzheimer's disease Father    . Diabetes Mother    . Diabetes Sister     Social History   Socioeconomic History  . Marital status: Married    Spouse name: None  . Number of children: None  . Years of education: None  . Highest education level: None  Occupational History  . None  Tobacco Use  . Smoking status: Never  . Smokeless tobacco: Never  Substance and Sexual Activity  . Alcohol use: Yes    Comment: occas  . Drug use: No  . Sexual activity: None  Other Topics Concern  . None  Social History Narrative  . None   Social Drivers of Health   Food Insecurity: Low Risk  (08/26/2023)   Food vital sign   . Within the past 12 months, you worried that your food would run out before you got money to buy more: Never true   . Within the past 12 months, the food you bought just didn't last and you didn't have money to get more: Never true  Transportation Needs: No Transportation Needs (08/26/2023)   Transportation   . In the past 12 months, has lack of reliable transportation kept you from medical appointments, meetings, work or from getting things needed for daily living? : No  Safety: Not on file  Living Situation: Low Risk  (08/26/2023)   Living Situation   . What is your living situation today?: I have a steady place to live   . Think about the place you live. Do you have problems with  any of the following? Choose all that apply:: None/None on this list   Past Surgical History:  Procedure Laterality Date  . CATARACT EXTRACTION    . CESAREAN SECTION, UNSPECIFIED     Procedure: CESAREAN SECTION  . FINGER SURGERY     Procedure: FINGER SURGERY  . KNEE SURGERY     Procedure: KNEE SURGERY  . TONSILLECTOMY     Procedure: TONSILLECTOMY  . TUMOR REMOVAL     Procedure: TUMOR REMOVAL    Current Outpatient Medications:  .  artificial tears, hypromellose, (GENTEAL TEARS) 0.3 % gel, at bedtime., Disp: , Rfl:  .  calcium-vitamin D3-vitamin K 500-200-40 mg-unit-mcg chew, Take 1,200 mg by mouth Once Daily., Disp: , Rfl:  .  ferrous sulfate 325 mg (65 mg iron) tablet, TAKE 1 TABLET (325 MG TOTAL) BY MOUTH IN THE MORNING AND AT BEDTIME. (Patient taking differently: 1 qhs), Disp: 180 tablet, Rfl: 0 .  glipiZIDE (GLUCOTROL XL) 2.5 mg 24 hr tablet, Take 1 tablet (2.5 mg total) by mouth daily., Disp: 90 tablet, Rfl: 3 .  metFORMIN (GLUCOPHAGE-XR) 500 mg 24 hr tablet, Take 2 pills twice per day, Disp: 360 tablet, Rfl: 3 .  valsartan-hydroCHLOROthiazide 320-25 mg tab, Take one pill per day, Disp: 90 tablet, Rfl: 3 .  pantoprazole (PROTONIX) 40 mg EC tablet, Take 1 tablet (40 mg total) by mouth 2 (two) times a day., Disp: 180 tablet, Rfl: 3 .  rOPINIRole (REQUIP) 0.5 mg tablet, Indications: restless legs syndrome, an extreme discomfort in the calf muscles when sitting or lying down. 1 at 7:00p. And 3 at 10:30 pm, Disp: 360 tablet, Rfl: 3 .  rosuvastatin (CRESTOR) 10 mg tablet, Take 1 tablet (10 mg total) by mouth daily., Disp: 90 tablet, Rfl: 3 No Known Allergies Patient Active Problem List   Diagnosis Date Noted  . Other sleep apnea 08/27/2023  . Type 2 diabetes mellitus without complication, without long-term current use of insulin (HCC) 12/08/2020  . Gastroesophageal reflux disease without esophagitis 12/08/2020  . Age-related osteoporosis without current pathological fracture 03/16/2017   . High risk medication use 03/16/2017  . Hilar lymphadenopathy 03/16/2017  . Endometriosis 03/14/2017  . Abnormal serum angiotensin-converting enzyme level 03/11/2017  . ANA positive 03/11/2017  . Allergic rhinitis 07/03/2016  . Adjustment insomnia 07/03/2016  . Essential hypertension 07/03/2016  . Carpal tunnel syndrome 07/03/2016  . Hypercholesterolemia 07/03/2016  . Restless leg 07/03/2016  . Sarcoidosis 07/03/2016  . Acute bronchitis 10/16/2012  . Dysphagia 10/16/2012  . At risk for coronary artery disease 04/25/2011    The following information was reviewed by members of the visit team:  Allergies      HPI  Review of Systems  Objective  Vitals:   02/24/24 1514  BP: 110/70  Pulse: 82  Resp: 16  Temp: 99.7 F (37.6 C)  TempSrc: Oral  SpO2: 96%  Weight: 73.9 kg (163 lb)  Height: 1.689 m (5' 6.5)    Physical Exam Vitals and nursing note reviewed.  Constitutional:      General: She is not in acute distress.    Appearance: Normal appearance.  HENT:     Head: Normocephalic.     Right Ear: Tympanic membrane normal.     Left Ear: Tympanic membrane normal.     Mouth/Throat:     Mouth: Mucous membranes are moist.  Eyes:     General: No scleral icterus.    Conjunctiva/sclera: Conjunctivae normal.  Cardiovascular:     Rate and Rhythm: Normal rate and regular rhythm.  Pulmonary:     Effort: Pulmonary effort is normal.     Comments: Course breath sounds left upper lobe  Neurological:     General: No focal deficit present.     Mental Status: She is alert. Mental status is at baseline.  Psychiatric:        Mood and Affect: Mood normal.        Behavior: Behavior normal.        Thought Content: Thought content normal.    I have personally spent  40 minutes involved in face-to-face and non-face-to-face activities for this patient on the day of the visit.  Professional time spent includes the following activities, in addition to those noted in the documentation:   - preparing to see the patient (e.g., review of recent and/or remote lab/imaging/study results, provider notes, and patient messages/phone calls available in current EMR, CareEverywhere, and scanned records) -obtaining and/or reviewing separately obtained history either through past provider notes, patient phone calls, and/or patient's family member(s)/caregiver(s) -performing a medically appropriate examination and/or evaluation -counseling and educating the patient/family/caregiver -ordering medications, tests, or procedures -documenting clinical information in the electronic or other health record -reviewing most up to date studies or expert consensus guidelines for screening/diagnosing/treating pertinent conditions/symptoms -independently interpreting results (not separately reported) and communicating results to the patient/family/caregiver -care coordination (not separately reported) -referring and communicating with other health care professionals (when not separately reported)     Assessment/Plan  Problem List Items Addressed This Visit     Adjustment insomnia   Hypercholesterolemia   Type 2 diabetes mellitus without complication, without long-term current use of insulin (HCC)   Other Visit Diagnoses       Essential (primary) hypertension    -  Primary     History of sarcoidosis       Relevant Orders   XR Chest 2 Views     Abnormal lung sounds       Relevant Orders   XR Chest 2 Views     Other iron deficiency anemia         Restless legs syndrome           Medications have been refilled.  Will refill others when needed. All labs reviewed great improvement of A1c continue to work on healthy diet and exercise. Will follow-up in 6 months sooner if needed. Discussed getting updated tetanus shot.  Will get updated chest x-ray-history of sarcoidosis -fully resolved some noted changes in lung exam.  Discussed that this may be completely normal would like to just get an updated  chest x-ray.  Please go to the imaging center [facility] and will message with results of x-ray.  Sjogren syndrome/osteoporosis continue to follow with rheumatology as planned.  Insomnia discussed try Magnesium Glycinate 200 mg at bedtime to see if helpful.   Electronically signed: Kristen Diane Kaplan, PA-C 02/24/2024  3:42 PM

## 2024-02-25 DIAGNOSIS — R0989 Other specified symptoms and signs involving the circulatory and respiratory systems: Secondary | ICD-10-CM | POA: Diagnosis not present

## 2024-02-25 DIAGNOSIS — G4733 Obstructive sleep apnea (adult) (pediatric): Secondary | ICD-10-CM | POA: Diagnosis not present

## 2024-02-25 DIAGNOSIS — Z862 Personal history of diseases of the blood and blood-forming organs and certain disorders involving the immune mechanism: Secondary | ICD-10-CM | POA: Diagnosis not present

## 2024-03-27 DIAGNOSIS — G4733 Obstructive sleep apnea (adult) (pediatric): Secondary | ICD-10-CM | POA: Diagnosis not present

## 2024-04-06 DIAGNOSIS — Z961 Presence of intraocular lens: Secondary | ICD-10-CM | POA: Diagnosis not present

## 2024-04-06 DIAGNOSIS — D3132 Benign neoplasm of left choroid: Secondary | ICD-10-CM | POA: Diagnosis not present

## 2024-04-10 DIAGNOSIS — Z0100 Encounter for examination of eyes and vision without abnormal findings: Secondary | ICD-10-CM | POA: Diagnosis not present

## 2024-05-18 DIAGNOSIS — Z1231 Encounter for screening mammogram for malignant neoplasm of breast: Secondary | ICD-10-CM | POA: Diagnosis not present

## 2024-05-27 DIAGNOSIS — G4733 Obstructive sleep apnea (adult) (pediatric): Secondary | ICD-10-CM | POA: Diagnosis not present

## 2024-06-30 NOTE — Progress Notes (Signed)
 Office Visit Note  Patient: Alison [name]             Date of Birth: Mar 23, 1956           MRN: [id number]             PCP: Debrah Josette ORN., PA-C Referring: Debrah Josette ORN., PA-C Visit Date: 07/14/2024 Occupation: @GUAROCC @  Subjective:  Pain in elbows and ankles   History of Present Illness: Alison [name] is a 68 y.o. female with sarcoidosis, Sjogren's, osteoarthritis and osteoporosis.  She returns today after her last visit in November 2024.  She states she has had a lot of stress in the last few months which is getting better.  She has been having increased pain in her both elbows and her both ankles.  None of the other joints have been painful.  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  The last chest x-ray in 2022 was normal.  She denies any shortness of breath.  She continues to have dry mouth and dry eye symptoms.  She uses over-the-counter eyedrops.  She also uses CPAP at night.  She states since she has been using CPAP the dry mouth symptoms have improved.  Her last Reclast  infusion was in September 2024    Activities of Daily Living:  Patient reports morning stiffness for 0 minutes.   Patient Denies nocturnal pain.  Difficulty dressing/grooming: Denies Difficulty climbing stairs: Denies Difficulty getting out of chair: Denies Difficulty using hands for taps, buttons, cutlery, and/or writing: Denies  Review of Systems  Constitutional:  Positive for fatigue.  HENT:  Positive for mouth dryness. Negative for mouth sores.   Eyes:  Positive for dryness.  Respiratory:  Negative for shortness of breath.   Cardiovascular:  Negative for chest pain and palpitations.  Gastrointestinal:  Negative for blood in stool, constipation and diarrhea.  Endocrine: Negative for increased urination.  Genitourinary:  Negative for involuntary urination.  Musculoskeletal:  Positive for joint pain, joint pain and joint swelling. Negative for gait problem, myalgias, muscle weakness,  morning stiffness, muscle tenderness and myalgias.  Skin:  Negative for color change, rash, hair loss and sensitivity to sunlight.  Allergic/Immunologic: Negative for susceptible to infections.  Neurological:  Negative for dizziness and headaches.  Hematological:  Negative for swollen glands.  Psychiatric/Behavioral:  Negative for depressed mood and sleep disturbance. The patient is not nervous/anxious.     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   At risk for coronary artery disease 04/25/2011   Endometriosis    Hypertension    Hyperlipidemia    RLS (restless legs syndrome)     Past Medical History:  Diagnosis Date   Anemia    hx of due to heavy menstral cycles   Chest pain    Endometriosis    GERD (gastroesophageal reflux disease)    Headache(784.0)    occassional headaches   Hiatal hernia    Hyperlipidemia    Hypertension    Inflammatory arthritis    Mediastinal adenopathy 02/01/2012   PET HOT, High ACE level, EBUS 02/26/12   RLS (restless legs syndrome)    Sleep apnea    moderate- on CPAP  Vitamin D  deficiency     Family History  Problem Relation Age of Onset   Asthma Mother    Diabetes Mother        and sister, MGM   Heart disease Father    Heart attack Father    Hypertension Father    Alzheimer's disease Father    Cancer Maternal Grandfather    Hypertension Paternal Grandmother    Sleep apnea Neg Hx    Past Surgical History:  Procedure Laterality Date   CATARACT EXTRACTION, BILATERAL Bilateral 06/2023   CESAREAN SECTION     ENDOMETRIAL BIOPSY     FINGER SURGERY     x2   TONSILLECTOMY     TUMOR  REMOVAL     gastric schwannoma   Social History   Social History Narrative   Not on file   Immunization History  Administered Date(s) Administered   Influenza Split 09/03/2011, 10/03/2012   Influenza, Quadrivalent, Recombinant, Inj, Pf 08/14/2019   Moderna Sars-Covid-2 Vaccination 01/29/2020, 02/26/2020, 09/02/2020   Pneumococcal Polysaccharide-23 01/04/2012   Zoster Recombinant(Shingrix) 06/20/2018, 03/07/2019     Objective: Vital Signs: BP 126/77 (BP Location: Left Arm, Patient Position: Sitting, Cuff Size: Normal)   Pulse 81   Resp 14   Ht 5' 6.5 (1.689 m)   Wt 169 lb 9.6 oz (76.9 kg)   BMI 26.96 kg/m    Physical Exam Vitals and nursing note reviewed.  Constitutional:      Appearance: She is well-developed.  HENT:     Head: Normocephalic and atraumatic.  Eyes:     Conjunctiva/sclera: Conjunctivae normal.  Cardiovascular:     Rate and Rhythm: Normal rate and regular rhythm.     Heart sounds: Normal heart sounds.  Pulmonary:     Effort: Pulmonary effort is normal.     Breath sounds: Normal breath sounds.  Abdominal:     General: Bowel sounds are normal.     Palpations: Abdomen is soft.  Musculoskeletal:     Cervical back: Normal range of motion.  Lymphadenopathy:     Cervical: No cervical adenopathy.  Skin:    General: Skin is warm and dry.     Capillary Refill: Capillary refill takes less than 2 seconds.  Neurological:     Mental Status: She is alert and oriented to person, place, and time.  Psychiatric:        Behavior: Behavior normal.      Musculoskeletal Exam: Cervical, thoracic and lumbar spine were in good range of motion.  Shoulders, elbow joints, wrist joints, MCPs PIPs and DIPs were in good range of motion with no synovitis.  She tenderness over bilateral medial epicondyle region.  He was on discomfort range of motion of the hip joints, knee joints, ankles, MTPs.  There was no synovitis over ankles.  CDAI Exam: CDAI Score: -- Patient Global:  --; Provider Global: -- Swollen: --; Tender: -- Joint Exam 07/14/2024   No joint exam has been documented for this visit   There is currently no information documented on the homunculus. Go to the Rheumatology activity and complete the homunculus joint exam.  Investigation: No additional findings.  Imaging: No results found.  Recent Labs: Lab Results  Component Value Date   WBC 8.7 04/02/2023   HGB 11.6 (L) 04/02/2023   PLT 371 04/02/2023   NA 138 07/16/2023   K 3.8 07/16/2023   CL 98 07/16/2023   CO2 28 07/16/2023   GLUCOSE 134 (H) 07/16/2023   BUN 7 07/16/2023   CREATININE  0.60 07/16/2023   BILITOT 0.7 07/16/2023   ALKPHOS 100 05/31/2017   AST 27 07/16/2023   ALT 35 (H) 07/16/2023   PROT 6.9 07/16/2023   ALBUMIN 4.2 05/31/2017   CALCIUM 9.7 07/16/2023   GFRAA 108 06/25/2019    FINDINGS:  .  Supportive devices: No visualized internal supportive devices. .  Cardiovascular/Mediastinum: Normal size and contours of the cardiomediastinal silhouette. .  Lungs/Pleura: No focal consolidation. No pleural effusion. No pneumothorax. .  Other: Small linear radiopaque foci project over the left upper quadrant, possibly postsurgical versus ingested contents. No acute osseous abnormality.  IMPRESSION: No radiographic evidence of acute cardiac or pulmonary abnormality. Exam End: 02/25/24 11:26    Speciality Comments: Fosamax  November 2017 till July 2019 Reclast  IV August 22, 2023  Procedures:  No procedures performed Allergies: Patient has no known allergies.   Assessment / Plan:     Visit Diagnoses: Sarcoidosis, lung, stage I - Elevated ACE, hilar lymphadenopathy.  Chest x-ray March 2025 negative: Lungs were clear to auscultation.  Patient denies any shortness of breath.  Sjogren's syndrome with other organ involvement (HCC) - Positive ANA, positive SSA antibody. She continues to have dry eyes for which she has been using eyedrops as needed.  She states that dry mouth  symptoms have improved since she has been using CPAP at nighttime.  Plan: CBC with Differential/Platelet, Comprehensive metabolic panel with GFR, Sedimentation rate, ANA, Anti-DNA antibody, double-stranded, C3 and C4, Sjogrens syndrome-A extractable nuclear antibody, Serum protein electrophoresis with reflex in 1 month.  Hilar lymphadenopathy-in the past.  High risk medication use - This continued to methotrexate  in February 2020 due to elevated LFTs.  Medial epicondylitis of both elbows-she has been having discomfort over the medial epicondyle region.  She plays piano and flute.  Topical Voltaren gel was discussed.  A handout on forearm exercise was given.  Primary osteoarthritis of both hands-she has PIP and DIP thickening without any synovitis.  Chronic pain of both ankles-patient states that she has gained some weight and has been having some discomfort in her ankles.  No synovitis was noted on the examination.  Patient states she was also under a lot of stress when she had arthralgias.  The symptoms are improving now.  Age-related osteoporosis without current pathological fracture - DEXA 05/13/2023: AP spine BMD 0.777 with T-score -2.5. -13% decrease Right total femur and -14% decrease in Left total femur.  Recast August 22, 2023.  Plan repeat Reclast  September 2025.  She will go on a drug holiday after that.  Will have repeat bone density in June 2026.  Vitamin D  deficiency -she stopped taking vitamin D .  Plan: VITAMIN D  25 Hydroxy (Vit-D Deficiency, Fractures)  History of hyperlipidemia-she is on Crestor.  History of hypertension-on valsartan with HCTZ.  Blood pressure was normal today.  Primary insomnia  RLS (restless legs syndrome)  Low iron  Orders: Orders Placed This Encounter  Procedures   CBC with Differential/Platelet   Comprehensive metabolic panel with GFR   Sedimentation rate   VITAMIN D  25 Hydroxy (Vit-D Deficiency, Fractures)   ANA   Anti-DNA antibody,  double-stranded   C3 and C4   Sjogrens syndrome-A extractable nuclear antibody   Serum protein electrophoresis with reflex   No orders of the defined types were placed in this encounter.    Follow-Up Instructions: Return in about 5 months (around 12/14/2024) for Sarcoidosis, Osteoarthritis, Osteoporosis, Sjogren's.   Maya Nash, MD  Note - This record has been created using Animal nutritionist.  Chart  creation errors have been sought, but may not always  have been located. Such creation errors do not reflect on  the standard of medical care.

## 2024-07-14 ENCOUNTER — Encounter: Payer: Self-pay | Admitting: Rheumatology

## 2024-07-14 ENCOUNTER — Telehealth: Payer: Self-pay | Admitting: Pharmacist

## 2024-07-14 ENCOUNTER — Ambulatory Visit: Payer: Medicare HMO | Attending: Rheumatology | Admitting: Rheumatology

## 2024-07-14 VITALS — BP 126/77 | HR 81 | Resp 14 | Ht 66.5 in | Wt 169.6 lb

## 2024-07-14 DIAGNOSIS — R590 Localized enlarged lymph nodes: Secondary | ICD-10-CM | POA: Diagnosis not present

## 2024-07-14 DIAGNOSIS — Z79899 Other long term (current) drug therapy: Secondary | ICD-10-CM | POA: Diagnosis not present

## 2024-07-14 DIAGNOSIS — M7701 Medial epicondylitis, right elbow: Secondary | ICD-10-CM

## 2024-07-14 DIAGNOSIS — F5101 Primary insomnia: Secondary | ICD-10-CM

## 2024-07-14 DIAGNOSIS — M810 Age-related osteoporosis without current pathological fracture: Secondary | ICD-10-CM | POA: Diagnosis not present

## 2024-07-14 DIAGNOSIS — E559 Vitamin D deficiency, unspecified: Secondary | ICD-10-CM | POA: Diagnosis not present

## 2024-07-14 DIAGNOSIS — M19042 Primary osteoarthritis, left hand: Secondary | ICD-10-CM

## 2024-07-14 DIAGNOSIS — M25572 Pain in left ankle and joints of left foot: Secondary | ICD-10-CM

## 2024-07-14 DIAGNOSIS — M25571 Pain in right ankle and joints of right foot: Secondary | ICD-10-CM

## 2024-07-14 DIAGNOSIS — G2581 Restless legs syndrome: Secondary | ICD-10-CM

## 2024-07-14 DIAGNOSIS — M19041 Primary osteoarthritis, right hand: Secondary | ICD-10-CM

## 2024-07-14 DIAGNOSIS — Z8639 Personal history of other endocrine, nutritional and metabolic disease: Secondary | ICD-10-CM | POA: Diagnosis not present

## 2024-07-14 DIAGNOSIS — Z8679 Personal history of other diseases of the circulatory system: Secondary | ICD-10-CM | POA: Diagnosis not present

## 2024-07-14 DIAGNOSIS — D860 Sarcoidosis of lung: Secondary | ICD-10-CM | POA: Diagnosis not present

## 2024-07-14 DIAGNOSIS — M7702 Medial epicondylitis, left elbow: Secondary | ICD-10-CM

## 2024-07-14 DIAGNOSIS — M3509 Sicca syndrome with other organ involvement: Secondary | ICD-10-CM

## 2024-07-14 DIAGNOSIS — E611 Iron deficiency: Secondary | ICD-10-CM

## 2024-07-14 DIAGNOSIS — G8929 Other chronic pain: Secondary | ICD-10-CM

## 2024-07-14 NOTE — Telephone Encounter (Signed)
 Patient due for Reclast  labs on 08/21/24. She will have labs drawn in about 4 weeks. Will place Reclast  orders at [facility] after that visit  Please re-run Reclast  [id number] benefits  Sherry Pennant, PharmD, MPH, BCPS, CPP Clinical Pharmacist (Rheumatology and Pulmonology)

## 2024-07-14 NOTE — Patient Instructions (Addendum)
 Exercises for Elbow and Forearm Elbow and forearm exercises can help you get better after an injury or health problem. Only do the exercises you were told to do. Make sure you know how to do the exercises safely. Follow the steps below. It's normal to feel mild discomfort. Stop if you feel pain or your pain gets worse. Do not start these exercises until told by your health care provider. Range-of-motion exercises These exercises warm up your muscles and joints. They can help your elbow and forearm move better and be more flexible. These exercises are done using the muscles in your injured elbow and forearm. Elbow flexion, active  Hold your left / right arm at your side. Bend your elbow as far as you can using only your arm muscles. Hold this position for __________ seconds. Slowly go back to the starting position. Repeat __________ times. Do this exercise __________ times a day. Elbow extension, active  Hold your left / right arm at your side. Straighten your elbow as much as you can using only your arm muscles. Hold this position for __________ seconds. Slowly go back to the starting position. Repeat __________ times. Do this exercise __________ times a day. Forearm rotation, supination This is an exercise in which you turn your forearm palm-up. Stand or sit with your elbows at your sides. Bend your left / right elbow to a 90-degree angle (right angle). Position your forearm so that your thumb faces the ceiling. Turn your palm up toward the ceiling until you feel a gentle stretch on the inside of your forearm. If told, use your other hand to help turn your forearm farther until you feel a gentle to moderate stretch. Hold this position for __________ seconds. Slowly go back to the starting position. Repeat __________ times. Do this exercise __________ times a day. Forearm rotation, pronation This is an exercise in which you turn your forearm palm-down. Stand or sit with your elbows at  your sides. Bend your left / right elbow to a 90-degree angle. Position your forearm so that your thumb faces the ceiling. Turn your palm down until you feel a gentle stretch on the top of your forearm. If told, use your other hand to help turn your forearm farther until you feel a gentle to moderate stretch. Hold this position for __________ seconds. Slowly go back to the starting position. Repeat __________ times. Do this exercise __________ times a day. Stretching exercises These exercises warm up your muscles and joints. They're done using your healthy elbow and forearm to help stretch the muscles in your injured elbow and forearm. Elbow flexion, active-assisted  Hold your left / right arm at your side. Bend your elbow as much as you can using your left / right arm muscles. Use your other hand to bend your left / right elbow farther. Gently push up on your forearm until you feel a gentle stretch on the back of your elbow. Hold this position for __________ seconds. Slowly go back to the starting position. Repeat __________ times. Do this exercise __________ times a day. Elbow extension, active-assisted  Hold your left / right arm at your side. Straighten your elbow as much as you can using your left / right arm muscles. Use your other hand to straighten the left / right elbow farther. Gently push down on your forearm until you feel a gentle stretch on the inside of your elbow. Hold this position for __________ seconds. Slowly go back to the starting position. Repeat __________ times. Do  this exercise __________ times a day. Passive elbow flexion, supine  Lie on your back. Lift your left / right arm up in the air, bracing it with your other hand. Let your left / right hand slowly lower toward your shoulder. Keep your elbow pointed toward the ceiling. You should feel a gentle stretch along the back of your upper arm and elbow. If told, you may add a small wrist weight or hand weight to  increase the stretch. Hold this position for __________ seconds. Slowly go back to the starting position. Repeat __________ times. Do this exercise __________ times a day. Passive elbow extension, supine  Lie on your back. Make sure you're comfortable and can relax your arm muscles. Place a folded towel under your left / right upper arm so your elbow and shoulder are at the same height. Straighten your left / right arm so your elbow doesn't rest on the bed or towel. Let the weight of your hand stretch your elbow. Keep your arm and chest muscles relaxed. You should feel a stretch on the inside of your elbow. If told, you may add a small wrist weight or hand weight to increase the stretch. Hold this position for __________ seconds. Slowly release the stretch. Repeat __________ times. Do this exercise __________ times a day. Strengthening exercises These exercises build strength and endurance in your elbow and forearm. Endurance is the ability to use your muscles for a long time, even after they get tired. Elbow flexion, isometric  Stand or sit up straight. Bend your left / right elbow in a 90-degree angle. Keep your forearm at the height of your waist. Your thumb should point toward the ceiling. Place your other hand on top of your left / right forearm. Gently push down while you resist with your left / right arm. Push as hard as you can with both arms without causing any pain or movement at your left / right elbow. Hold this position for __________ seconds. Slowly release the tension in both arms. Let your muscles fully relax. Repeat __________ times. Do this exercise __________ times a day. Elbow extension, isometric  Stand or sit up straight. Place your left / right arm so your palm faces your belly and is at the height of your waist. Place your other hand on the underside of your left / right forearm. Gently push up while you resist with your left / right arm. Push as hard as you can  with both arms without causing any pain or movement at your left / right elbow. Hold this position for __________ seconds. Slowly release the tension in both arms. Let your muscles fully relax. Repeat __________ times. Do this exercise __________ times a day. Elbow flexion with forearm palm up  Sit on a firm chair without armrests or stand up. Place your left / right arm at your side with your elbow straight and your palm facing forward. Holding a __________ weight or gripping a rubber exercise band or tubing, bend your elbow to bring your hand toward your shoulder. Hold this position for __________ seconds. Slowly go back to the starting position. Repeat __________ times. Do this exercise __________ times a day. Elbow extension, active  Sit on a firm chair without armrests or stand up. Hold a rubber exercise band or tubing in both hands. Keep your upper arms at your sides. Bring both hands up to your left / right shoulder. Keep your left / right hand just below your other hand. Straighten your left /  right elbow. Keep your other arm still. Hold this position for __________ seconds. Control the resistance of the band or tubing as you go back to the starting position. Repeat __________ times. Do this exercise __________ times a day. Forearm rotation with weight, supination  Sit with your left / right forearm supported on a table. Your elbow should be at waist height and bent at a 90-degree angle. Gently grasp a lightweight hammer. Rest your hand over the edge of the table with your palm facing down. Without moving your left / right elbow, slowly turn your forearm to turn your palm up toward the ceiling. Hold this position for __________ seconds. Slowly go back to the starting position. Repeat __________ times. Do this exercise __________ times a day. Forearm rotation with weight, pronation  Sit with your left / right forearm supported on a table. Keep your elbow below shoulder  height. Gently grasp a lightweight hammer. Rest your hand over the edge of the table with your palm facing up. Without moving your left / right elbow, slowly turn your forearm to turn your palm down toward the floor. Hold this position for __________ seconds. Slowly go back to the starting position. Repeat __________ times. Do this exercise __________ times a day. This information is not intended to replace advice given to you by your health care provider. Make sure you discuss any questions you have with your health care provider. Document Revised: 06/13/2023 Document Reviewed: 06/13/2023 Elsevier Patient Education  2024 [employer]  Standing Labs We placed an order today for your standing lab work.   Please have your standing labs drawn in  September  Please have your labs drawn 2 weeks prior to your appointment so that the provider can discuss your lab results at your appointment, if possible.  Please note that you may see your imaging and lab results in MyChart before we have reviewed them. We will contact you once all results are reviewed. Please allow our office up to 72 hours to thoroughly review all of the results before contacting the office for clarification of your results.  WALK-IN LAB HOURS  Monday through Thursday from 8:00 am -12:30 pm and 1:00 pm-4:30 pm and Friday from 8:00 am-12:00 pm.  Patients with office visits requiring labs will be seen before walk-in labs.  You may encounter longer than normal wait times. Please allow additional time. Wait times may be shorter on  Monday and Thursday afternoons.  We do not book appointments for walk-in labs. We appreciate your patience and understanding with our staff.   Labs are drawn by [facility]. Please bring your co-pay at the time of your lab draw.  You may receive a bill from [facility] for your lab work.  Please note if you are on Hydroxychloroquine and and an order has been placed for a Hydroxychloroquine level,  you will  need to have it drawn 4 hours or more after your last dose.  If you wish to have your labs drawn at another location, please call the office 24 hours in advance so we can fax the orders.  The office is located at [address], [address], [place], [place] [address]   If you have any questions regarding directions or hours of operation,  please call [phone].   As a reminder, please drink plenty of water prior to coming for your lab work. Thanks!

## 2024-08-05 ENCOUNTER — Encounter: Payer: Self-pay | Admitting: Rheumatology

## 2024-08-10 ENCOUNTER — Other Ambulatory Visit: Payer: Self-pay | Admitting: *Deleted

## 2024-08-10 DIAGNOSIS — E559 Vitamin D deficiency, unspecified: Secondary | ICD-10-CM

## 2024-08-10 DIAGNOSIS — M3509 Sicca syndrome with other organ involvement: Secondary | ICD-10-CM

## 2024-08-11 ENCOUNTER — Other Ambulatory Visit: Payer: Self-pay | Admitting: Pharmacist

## 2024-08-11 ENCOUNTER — Telehealth (HOSPITAL_COMMUNITY): Payer: Self-pay

## 2024-08-11 ENCOUNTER — Ambulatory Visit: Payer: Self-pay | Admitting: Rheumatology

## 2024-08-11 NOTE — Progress Notes (Signed)
 Next infusion for Reclast  IV (G6510) due for updated orders. Diagnosis: age-related osteoporosis  Dose: 5mg  IV every 12 months  Last Clinic Visit: 07/14/2024 Next Clinic Visit: 01/07/2025  Last infusion: 08/22/2023   Labs: 08/10/2024 - Calcium, SCr, and Vitamin D  labs wnl   Orders placed for Reclast  IV (J3489) x 1 dose along with premedication of acetaminophen  650mg  p.o. and diphenhydramine  25mg  p.o. to be administered 30 minutes before medication infusion.  Called patient and advised that infusion center [facility] [phone]) [place]) will reach out once ready for scheduling  Sherry Pennant, PharmD, MPH, BCPS, CPP Clinical Pharmacist [facility])

## 2024-08-11 NOTE — Telephone Encounter (Signed)
 Calcium, SCr, and Vitamin D  labs wnl to proceed with Reclast  infusion  Please proceed with Reclast  BIV  Sherry Pennant, PharmD, MPH, BCPS, CPP Clinical Pharmacist [facility])

## 2024-08-11 NOTE — Progress Notes (Signed)
 She should discuss her elevated LFTs with her PCP.

## 2024-08-11 NOTE — Telephone Encounter (Signed)
 Auth Submission: NO AUTH NEEDED Site of care: Site of care: [facility] Payer: [employer] Medicare Medication & CPT/J Code(s) submitted: Reclast  (Zolendronic acid) [id number] Diagnosis Code: M81.0 Route of submission (phone, fax, portal):  Phone # Fax # Auth type: Buy/Bill HB Units/visits requested: 5mg  x 1 dose Reference number:  Approval from: 08/11/24 to 12/02/24

## 2024-08-11 NOTE — Progress Notes (Signed)
 Glucose is elevated and LFTs are elevated.  Please notify patient and forward results to her PCP.  LFTs may be elevated due to statin use.  Patient should avoid all NSAIDs and alcohol use.

## 2024-08-12 LAB — CBC WITH DIFFERENTIAL/PLATELET
Absolute Lymphocytes: 1606 {cells}/uL (ref 850–3900)
Absolute Monocytes: 432 {cells}/uL (ref 200–950)
Basophils Absolute: 43 {cells}/uL (ref 0–200)
Basophils Relative: 0.6 %
Eosinophils Absolute: 439 {cells}/uL (ref 15–500)
Eosinophils Relative: 6.1 %
HCT: 38.9 % (ref 35.0–45.0)
Hemoglobin: 12.5 g/dL (ref 11.7–15.5)
MCH: 28.4 pg (ref 27.0–33.0)
MCHC: 32.1 g/dL (ref 32.0–36.0)
MCV: 88.4 fL (ref 80.0–100.0)
MPV: 12.1 fL (ref 7.5–12.5)
Monocytes Relative: 6 %
Neutro Abs: 4680 {cells}/uL (ref 1500–7800)
Neutrophils Relative %: 65 %
Platelets: 269 Thousand/uL (ref 140–400)
RBC: 4.4 Million/uL (ref 3.80–5.10)
RDW: 12.3 % (ref 11.0–15.0)
Total Lymphocyte: 22.3 %
WBC: 7.2 Thousand/uL (ref 3.8–10.8)

## 2024-08-12 LAB — PROTEIN ELECTROPHORESIS, SERUM, WITH REFLEX
Albumin ELP: 3.9 g/dL (ref 3.8–4.8)
Alpha 1: 0.3 g/dL (ref 0.2–0.3)
Alpha 2: 0.8 g/dL (ref 0.5–0.9)
Beta 2: 0.5 g/dL (ref 0.2–0.5)
Beta Globulin: 0.6 g/dL (ref 0.4–0.6)
Gamma Globulin: 1 g/dL (ref 0.8–1.7)
Total Protein: 7 g/dL (ref 6.1–8.1)

## 2024-08-12 LAB — C3 AND C4
C3 Complement: 185 mg/dL (ref 83–193)
C4 Complement: 27 mg/dL (ref 15–57)

## 2024-08-12 LAB — COMPREHENSIVE METABOLIC PANEL WITH GFR
AG Ratio: 1.5 (calc) (ref 1.0–2.5)
ALT: 77 U/L — ABNORMAL HIGH (ref 6–29)
AST: 70 U/L — ABNORMAL HIGH (ref 10–35)
Albumin: 4.1 g/dL (ref 3.6–5.1)
Alkaline phosphatase (APISO): 123 U/L (ref 37–153)
BUN: 9 mg/dL (ref 7–25)
CO2: 29 mmol/L (ref 20–32)
Calcium: 9.4 mg/dL (ref 8.6–10.4)
Chloride: 97 mmol/L — ABNORMAL LOW (ref 98–110)
Creat: 0.61 mg/dL (ref 0.50–1.05)
Globulin: 2.7 g/dL (ref 1.9–3.7)
Glucose, Bld: 249 mg/dL — ABNORMAL HIGH (ref 65–99)
Potassium: 3.8 mmol/L (ref 3.5–5.3)
Sodium: 137 mmol/L (ref 135–146)
Total Bilirubin: 0.9 mg/dL (ref 0.2–1.2)
Total Protein: 6.8 g/dL (ref 6.1–8.1)
eGFR: 97 mL/min/1.73m2 (ref >=60)

## 2024-08-12 LAB — SJOGRENS SYNDROME-A EXTRACTABLE NUCLEAR ANTIBODY: SSA (Ro) (ENA) Antibody, IgG: <1 AI

## 2024-08-12 LAB — ANTI-NUCLEAR AB-TITER (ANA TITER): ANA Titer 1: 1:320 {titer} — ABNORMAL HIGH

## 2024-08-12 LAB — SEDIMENTATION RATE: Sed Rate: 22 mm/h (ref 0–30)

## 2024-08-12 LAB — VITAMIN D 25 HYDROXY (VIT D DEFICIENCY, FRACTURES): Vit D, 25-Hydroxy: 55 ng/mL (ref 30–100)

## 2024-08-12 LAB — ANA: Anti Nuclear Antibody (ANA): POSITIVE — AB

## 2024-08-12 LAB — ANTI-DNA ANTIBODY, DOUBLE-STRANDED: ds DNA Ab: 1 [IU]/mL

## 2024-08-13 NOTE — Progress Notes (Signed)
 ANA is positive and stable titer, SPEP normal, C3-C4 normal, double-stranded DNA negative, SSA negative, vitamin D  normal, sed rate normal.  Labs do not indicate an autoimmune disease flare.

## 2024-08-14 NOTE — Progress Notes (Signed)
 Reclast  scheduled for 08/24/24

## 2024-08-24 ENCOUNTER — Ambulatory Visit (HOSPITAL_COMMUNITY)
Admission: RE | Admit: 2024-08-24 | Discharge: 2024-08-24 | Disposition: A | Discharge status: Home / Self Care | Source: Ambulatory Visit | Attending: Rheumatology | Admitting: Rheumatology

## 2024-08-24 VITALS — BP 122/70 | HR 67 | Temp 97.5°F | Resp 17

## 2024-08-24 DIAGNOSIS — M810 Age-related osteoporosis without current pathological fracture: Secondary | ICD-10-CM | POA: Insufficient documentation

## 2024-08-24 MED ORDER — ZOLEDRONIC ACID 5 MG/100ML IV SOLN
5.0000 mg | Freq: Once | INTRAVENOUS | Status: AC
  Administered 2024-08-24: 5 mg via INTRAVENOUS

## 2024-08-24 MED ORDER — DIPHENHYDRAMINE HCL 25 MG PO CAPS
25.0000 mg | ORAL_CAPSULE | Freq: Once | ORAL | Status: AC
  Administered 2024-08-24: 25 mg via ORAL

## 2024-08-24 MED ORDER — SODIUM CHLORIDE 0.9 % IV SOLN: INTRAVENOUS | Status: DC

## 2024-08-24 MED ORDER — ACETAMINOPHEN 325 MG PO TABS
ORAL_TABLET | ORAL | Status: AC
  Filled 2024-08-24: qty 2

## 2024-08-24 MED ORDER — ACETAMINOPHEN 325 MG PO TABS
650.0000 mg | ORAL_TABLET | Freq: Once | ORAL | Status: AC
  Administered 2024-08-24: 650 mg via ORAL

## 2024-08-24 MED ORDER — DIPHENHYDRAMINE HCL 25 MG PO CAPS
ORAL_CAPSULE | ORAL | Status: AC
  Filled 2024-08-24: qty 1

## 2024-08-24 MED ORDER — ZOLEDRONIC ACID 5 MG/100ML IV SOLN
INTRAVENOUS | Status: AC
  Filled 2024-08-24: qty 100

## 2024-09-01 DIAGNOSIS — E119 Type 2 diabetes mellitus without complications: Secondary | ICD-10-CM | POA: Diagnosis not present

## 2024-09-01 DIAGNOSIS — E7800 Pure hypercholesterolemia, unspecified: Secondary | ICD-10-CM | POA: Diagnosis not present

## 2024-09-01 DIAGNOSIS — I10 Essential (primary) hypertension: Secondary | ICD-10-CM | POA: Diagnosis not present

## 2024-09-07 DIAGNOSIS — R7989 Other specified abnormal findings of blood chemistry: Secondary | ICD-10-CM | POA: Diagnosis not present

## 2024-09-07 DIAGNOSIS — Z0000 Encounter for general adult medical examination without abnormal findings: Secondary | ICD-10-CM | POA: Diagnosis not present

## 2024-09-07 DIAGNOSIS — Z862 Personal history of diseases of the blood and blood-forming organs and certain disorders involving the immune mechanism: Secondary | ICD-10-CM | POA: Diagnosis not present

## 2024-09-07 DIAGNOSIS — I10 Essential (primary) hypertension: Secondary | ICD-10-CM | POA: Diagnosis not present

## 2024-09-07 DIAGNOSIS — E119 Type 2 diabetes mellitus without complications: Secondary | ICD-10-CM | POA: Diagnosis not present

## 2024-09-07 DIAGNOSIS — Z23 Encounter for immunization: Secondary | ICD-10-CM | POA: Diagnosis not present

## 2024-09-07 DIAGNOSIS — E7800 Pure hypercholesterolemia, unspecified: Secondary | ICD-10-CM | POA: Diagnosis not present

## 2024-09-07 DIAGNOSIS — M816 Localized osteoporosis [Lequesne]: Secondary | ICD-10-CM | POA: Diagnosis not present

## 2024-10-12 DIAGNOSIS — R7989 Other specified abnormal findings of blood chemistry: Secondary | ICD-10-CM | POA: Diagnosis not present

## 2024-10-12 DIAGNOSIS — E119 Type 2 diabetes mellitus without complications: Secondary | ICD-10-CM | POA: Diagnosis not present

## 2024-10-12 DIAGNOSIS — I10 Essential (primary) hypertension: Secondary | ICD-10-CM | POA: Diagnosis not present

## 2024-10-12 DIAGNOSIS — E7800 Pure hypercholesterolemia, unspecified: Secondary | ICD-10-CM | POA: Diagnosis not present

## 2024-11-26 NOTE — Progress Notes (Signed)
 Patient is followed by Dr. Rollin (gastroenterologist) for elevated LFTs.  Please forward results to him.

## 2024-11-27 ENCOUNTER — Encounter: Payer: Self-pay | Admitting: Rheumatology

## 2024-11-27 NOTE — Telephone Encounter (Signed)
 I called patient, based on conversation Dr. Dolphus had with Dr. Geronimo, Dr. Dolphus wants patient to f/u with Dr. Rollin.

## 2024-12-14 NOTE — Progress Notes (Unsigned)
 SABRA

## 2024-12-15 ENCOUNTER — Encounter: Payer: Self-pay | Admitting: Adult Health

## 2024-12-15 ENCOUNTER — Ambulatory Visit: Payer: Medicare HMO | Admitting: Adult Health

## 2024-12-15 VITALS — BP 123/77 | HR 89 | Ht 66.5 in | Wt 169.8 lb

## 2024-12-15 DIAGNOSIS — G4733 Obstructive sleep apnea (adult) (pediatric): Secondary | ICD-10-CM | POA: Diagnosis not present

## 2024-12-15 NOTE — Patient Instructions (Signed)
 Continue using CPAP nightly and greater than 4 hours each night If your symptoms worsen or you develop new symptoms please let us  know.

## 2024-12-15 NOTE — Progress Notes (Signed)
 "   PATIENT: Alison [name] DOB: 1956-09-17  REASON FOR VISIT: follow up HISTORY FROM: patient PRIMARY NEUROLOGIST: Dr. Buck   Chief Complaint  Patient presents with   Follow-up    Patient in room 18 alone.  Patient is here for cpap follow up, no issues or concerns at the moment.  ESS score is 9     HISTORY OF PRESENT ILLNESS: Today 12/15/2024:  Alison [name] is a 69 y.o. female with a history of obstructive sleep apnea on CPAP. Returns today for follow-up.  She reports that CPAP is working well for her.  She continues to notice good benefit.  She is currently wearing the nasal pillows.  her last sleep study was in April 2024 which did show moderate to severe sleep apnea.  Her download is below she still struggles with waking up about every 2 hours.  Her PCP has tried her on several different medications according to the patient but none have been beneficial.  She now has a referral to psychiatry to address sleep concerns.       HISTORY   REVIEW OF SYSTEMS: Out of a complete 14 system review of symptoms, the patient complains only of the following symptoms, and all other reviewed systems are negative.  FSS ESS  ALLERGIES: Allergies[1]  HOME MEDICATIONS: Outpatient Medications Prior to Visit  Medication Sig Dispense Refill   Carboxymethylcellul-Glycerin (LUBRICATING EYE DROPS OP) Apply to eye as needed.     cholecalciferol (VITAMIN D ) 1000 UNITS tablet Take 1,000 Units by mouth daily. (Patient not taking: Reported on 07/14/2024)     ferrous sulfate 325 (65 FE) MG tablet Take by mouth.     glipiZIDE (GLUCOTROL XL) 2.5 MG 24 hr tablet Take 1 tablet by mouth daily.     metFORMIN (GLUCOPHAGE-XR) 500 MG 24 hr tablet Take 500 mg by mouth 2 (two) times daily.     pantoprazole (PROTONIX) 40 MG tablet Take 1 tablet by mouth 2 (two) times daily. (Patient taking differently: Take 1 tablet by mouth daily.)     rOPINIRole (REQUIP) 0.5 MG tablet Take 2 mg by mouth at bedtime.      rosuvastatin (CRESTOR) 10 MG tablet Take 10 mg by mouth daily.     valsartan-hydrochlorothiazide (DIOVAN-HCT) 320-25 MG per tablet Take 0.5 tablets by mouth daily.     No facility-administered medications prior to visit.    PAST MEDICAL HISTORY: Past Medical History:  Diagnosis Date   Anemia    hx of due to heavy menstral cycles   Chest pain    Endometriosis    GERD (gastroesophageal reflux disease)    Headache(784.0)    occassional headaches   Hiatal hernia    Hyperlipidemia    Hypertension    Inflammatory arthritis    Mediastinal adenopathy 02/01/2012   PET HOT, High ACE level, EBUS 02/26/12   RLS (restless legs syndrome)    Sleep apnea    moderate- on CPAP   Vitamin D  deficiency     PAST SURGICAL HISTORY: Past Surgical History:  Procedure Laterality Date   CATARACT EXTRACTION, BILATERAL Bilateral 06/2023   CESAREAN SECTION     ENDOMETRIAL BIOPSY     FINGER SURGERY     x2   TONSILLECTOMY     TUMOR REMOVAL     gastric schwannoma    FAMILY HISTORY: Family History  Problem Relation Age of Onset   Asthma Mother    Diabetes Mother        and sister, MGM  Heart disease Father    Heart attack Father    Hypertension Father    Alzheimer's disease Father    Cancer Maternal Grandfather    Hypertension Paternal Grandmother    Sleep apnea Neg Hx     SOCIAL HISTORY: Social History   Socioeconomic History   Marital status: Married    Spouse name: Not on file   Number of children: 1   Years of education: Not on file   Highest education level: Not on file  Occupational History   Occupation: warden/ranger  Tobacco Use   Smoking status: Never    Passive exposure: Never   Smokeless tobacco: Never  Vaping Use   Vaping status: Never Used  Substance and Sexual Activity   Alcohol use: Yes    Comment: social   Drug use: Never   Sexual activity: Not on file  Other Topics Concern   Not on file  Social History Narrative   Not on file   Social Drivers of Health    Tobacco Use: Low Risk (09/07/2024)   Received from [facility]   Patient History    Smoking Tobacco Use: Never    Smokeless Tobacco Use: Never    Passive Exposure: Not on file  Financial Resource Strain: Not on file  Food Insecurity: Low Risk (09/06/2024)   Received from [facility]   Epic    Within the past 12 months, you worried that your food would run out before you got money to buy more: Never true    Within the past 12 months, the food you bought just didn't last and you didn't have money to get more. : Never true  Transportation Needs: No Transportation Needs (09/06/2024)   Received from [facility]    In the past 12 months, has lack of reliable transportation kept you from medical appointments, meetings, work or from getting things needed for daily living? : No  Physical Activity: Not on file  Stress: Not on file  Social Connections: Not on file  Intimate Partner Violence: Not on file  Depression (EYV7-0): Not on file  Alcohol Screen: Not on file  Housing: Low Risk (09/06/2024)   Received from [facility]   Epic    What is your living situation today?: I have a steady place to live    Think about the place you live. Do you have problems with any of the following? Choose all that apply:: None/None on this list  Utilities: Low Risk (09/06/2024)   Received from [facility]   Utilities    In the past 12 months has the electric, gas, oil, or water company threatened to shut off services in your home? : No  Health Literacy: Not on file      PHYSICAL EXAM  Vitals:   12/15/24 1050  BP: 123/77  Pulse: 89  Weight: 169 lb 12.8 oz (77 kg)  Height: 5' 6.5 (1.689 m)   Body mass index is 27 kg/m.  Generalized: Well developed, in no acute distress  Chest: Lungs clear to auscultation bilaterally  Neurological examination  Mentation: Alert oriented to time, place, history taking. Follows all commands speech and language fluent Cranial nerve  II-XII: Extraocular movements were full, visual field were full on confrontational test Head turning and shoulder shrug  were normal and symmetric. Motor: The motor testing reveals 5 over 5 strength of all 4 extremities. Good symmetric motor tone is noted throughout.  Sensory: Sensory testing is intact to soft touch on  all 4 extremities. No evidence of extinction is noted.  Gait and station: Gait is normal.    DIAGNOSTIC DATA (LABS, IMAGING, TESTING) - I reviewed patient records, labs, notes, testing and imaging myself where available.  Lab Results  Component Value Date   WBC 7.2 08/10/2024   HGB 12.5 08/10/2024   HCT 38.9 08/10/2024   MCV 88.4 08/10/2024   PLT 269 08/10/2024      Component Value Date/Time   NA 137 08/10/2024 1131   K 3.8 08/10/2024 1131   CL 97 (L) 08/10/2024 1131   CO2 29 08/10/2024 1131   GLUCOSE 249 (H) 08/10/2024 1131   BUN 9 08/10/2024 1131   CREATININE 0.61 08/10/2024 1131   CALCIUM 9.4 08/10/2024 1131   PROT 7.0 08/10/2024 1131   PROT 6.8 08/10/2024 1131   ALBUMIN 4.2 05/31/2017 1130   AST 70 (H) 08/10/2024 1131   ALT 77 (H) 08/10/2024 1131   ALKPHOS 100 05/31/2017 1130   BILITOT 0.9 08/10/2024 1131   GFRNONAA 93 06/25/2019 1056   GFRAA 108 06/25/2019 1056   No results found for: CHOL, HDL, LDLCALC, LDLDIRECT, TRIG, CHOLHDL No results found for: YHAJ8R No results found for: VITAMINB12 No results found for: TSH    ASSESSMENT AND PLAN 69 y.o. year old female  has a past medical history of Anemia, Chest pain, Endometriosis, GERD (gastroesophageal reflux disease), Headache(784.0), Hiatal hernia, Hyperlipidemia, Hypertension, Inflammatory arthritis, Mediastinal adenopathy (02/01/2012), RLS (restless legs syndrome), Sleep apnea, and Vitamin D  deficiency. here with:  OSA on CPAP Sleep disturbance  - CPAP compliance excellent - Good treatment of AHI  - Encourage patient to use CPAP nightly and > 4 hours each night -We discussed  cognitive behavioral training to treat sleep disturbance/insomnia.  She already has a referral to psychiatry - F/U in 1 year or sooner if needed    Duwaine Russell, MSN, NP-C 12/15/2024, 8:05 AM [facility] [address] [place], [place] [address] [phone]  The patient's condition requires frequent monitoring and adjustments in the treatment plan, reflecting the ongoing complexity of care.  This provider is the continuing focal point for all needed services for this condition.      [1] No Known Allergies  "

## 2024-12-24 NOTE — Progress Notes (Signed)
 "  Office Visit Note  Patient: Alison [name]             Date of Birth: 29-Dec-1955           MRN: [id number]             PCP: Debrah Josette ORN., PA-C Referring: Debrah Josette ORN., PA-C Visit Date: 01/07/2025 Occupation: Data Unavailable  Subjective:  Pain in joints  History of Present Illness: Alison [name] is a 69 y.o. female with sarcoidosis, Sjogren's, osteoarthritis and osteoporosis.  She returns today after her last visit in August 2025.  She denies any increased shortness of breath.  She continues to have dry mouth and dry eyes.  She continues to have some discomfort in her elbows.  She has been going to the gym on a regular basis.  She states recently she has been been walking on a regular basis.  She has noticed bulge behind her right heel which has been more painful when she wears her shoes.  She has not had much discomfort in her ankles now.  None of the other joints are painful.  She is on drug holiday for the treatment of osteoporosis.  Her last Reclast  in September 2025.  She has been taking vitamin D  on a regular basis.    Activities of Daily Living:  Patient reports morning stiffness for 0 minutes.   Patient Denies nocturnal pain.  Difficulty dressing/grooming: Denies Difficulty climbing stairs: Denies Difficulty getting out of chair: Denies Difficulty using hands for taps, buttons, cutlery, and/or writing: Denies  Review of Systems  Constitutional:  Positive for fatigue.  HENT:  Positive for mouth dryness. Negative for mouth sores.   Eyes:  Positive for dryness.  Respiratory:  Negative for shortness of breath.   Cardiovascular:  Negative for chest pain and palpitations.  Gastrointestinal:  Negative for blood in stool, constipation and diarrhea.  Endocrine: Negative for increased urination.  Genitourinary:  Negative for involuntary urination.  Musculoskeletal:  Positive for joint pain and joint pain. Negative for gait problem, joint swelling, myalgias, muscle  weakness, morning stiffness, muscle tenderness and myalgias.  Skin:  Negative for color change, rash, hair loss and sensitivity to sunlight.  Allergic/Immunologic: Negative for susceptible to infections.  Neurological:  Negative for dizziness and headaches.  Hematological:  Negative for swollen glands.  Psychiatric/Behavioral:  Positive for sleep disturbance. Negative for depressed mood. The patient is not nervous/anxious.     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   At risk for coronary artery disease 04/25/2011   Endometriosis    Hypertension    Hyperlipidemia    RLS (restless legs syndrome)     Past Medical History:  Diagnosis Date   Anemia    hx of due to heavy menstral cycles   Chest pain    Endometriosis    GERD (gastroesophageal reflux disease)    Headache(784.0)    occassional headaches   Hiatal hernia    Hyperlipidemia    Hypertension    Inflammatory arthritis    Mediastinal adenopathy 02/01/2012   PET HOT, High ACE level, EBUS 02/26/12   RLS (restless legs syndrome)    Sleep apnea  moderate- on CPAP   Vitamin D  deficiency     Family History  Problem Relation Age of Onset   Asthma Mother    Diabetes Mother        and sister, MGM   Heart disease Father    Heart attack Father    Hypertension Father    Alzheimer's disease Father    Cancer Maternal Grandfather    Hypertension Paternal Grandmother    Sleep apnea Neg Hx    Past Surgical History:  Procedure Laterality Date   CATARACT EXTRACTION, BILATERAL Bilateral 06/2023   CESAREAN SECTION     ENDOMETRIAL BIOPSY     FINGER SURGERY     x2    TONSILLECTOMY     TUMOR REMOVAL     gastric schwannoma   Social History[1] Social History   Social History Narrative   Patient does not live alone   Patient is semi-retired.      Immunization History  Administered Date(s) Administered   Influenza Split 09/03/2011, 10/03/2012   Influenza, Quadrivalent, Recombinant, Inj, Pf 08/14/2019   Moderna Sars-Covid-2 Vaccination 01/29/2020, 02/26/2020, 09/02/2020   Pneumococcal Polysaccharide-23 01/04/2012   Zoster Recombinant(Shingrix) 06/20/2018, 03/07/2019     Objective: Vital Signs: BP 110/77   Pulse 93   Temp 97.9 F (36.6 C)   Resp 13   Ht 5' 7 (1.702 m)   Wt 171 lb 6.4 oz (77.7 kg)   BMI 26.85 kg/m    Physical Exam Vitals and nursing note reviewed.  Constitutional:      Appearance: She is well-developed.  HENT:     Head: Normocephalic and atraumatic.  Eyes:     Conjunctiva/sclera: Conjunctivae normal.  Cardiovascular:     Rate and Rhythm: Normal rate and regular rhythm.     Heart sounds: Normal heart sounds.  Pulmonary:     Effort: Pulmonary effort is normal.     Breath sounds: Normal breath sounds.  Abdominal:     General: Bowel sounds are normal.     Palpations: Abdomen is soft.  Musculoskeletal:     Cervical back: Normal range of motion.  Skin:    General: Skin is warm and dry.     Capillary Refill: Capillary refill takes less than 2 seconds.  Neurological:     Mental Status: She is alert and oriented to person, place, and time.  Psychiatric:        Behavior: Behavior normal.        Musculoskeletal Exam: Cervical, thoracic and lumbar spine were in good range of motion.  There was no SI joint tenderness.  Shoulder joints, elbow joints, wrist joints, MCPs, PIPs and DIPs were in good range of motion with no synovitis.  Hip joints and knee joints were in good range of motion without any warmth swelling or effusion.  There was no tenderness over ankles or MTPs.   CDAI Exam: CDAI Score: -- Patient Global:  --; Provider Global: -- Swollen: --; Tender: -- Joint Exam 01/07/2025   No joint exam has been documented for this visit   There is currently no information documented on the homunculus. Go to the Rheumatology activity and complete the homunculus joint exam.  Investigation: No additional findings.  Imaging: No results found.  Recent Labs: Lab Results  Component Value Date   WBC 7.2 08/10/2024   HGB 12.5 08/10/2024   PLT 269 08/10/2024   NA 137 08/10/2024   K 3.8 08/10/2024   CL 97 (L) 08/10/2024   CO2 29 08/10/2024   GLUCOSE  249 (H) 08/10/2024   BUN 9 08/10/2024   CREATININE 0.61 08/10/2024   BILITOT 0.9 08/10/2024   ALKPHOS 100 05/31/2017   AST 70 (H) 08/10/2024   ALT 77 (H) 08/10/2024   PROT 7.0 08/10/2024   PROT 6.8 08/10/2024   ALBUMIN 4.2 05/31/2017   CALCIUM 9.4 08/10/2024   GFRAA 108 06/25/2019   October 12, 2024 CBC hemoglobin 12.2, WBC 6.3, platelets 188, CMP glucose 116, alkaline phosphatase 145, AST 43, ALT 53  Speciality Comments: Fosamax  November 2017 till July 2019 Reclast  IV August 22, 2023 Methotrexate  2013 till 2019  Procedures:  No procedures performed Allergies: Patient has no known allergies.   Assessment / Plan:     Visit Diagnoses: Sarcoidosis, lung, stage I - Elevated ACE, hilar lymphadenopathy.  Chest x-ray March 2025 negative: Patient denies any shortness of breath.  Chest was clear to auscultation.  Sjogren's syndrome with other organ involvement -she continues to have dry mouth and dry eye symptoms.  Association of Sjogren's with ILD ,lymphadenopathy, arrhythmias and lymphoma was discussed.  Patient had no lymphadenopathy on the examination.  Chest was clear to auscultation. positive ANA, positive SSA antibody.  Hilar lymphadenopathy-in the past.  Recent chest x-ray was negative.  Elevated LFTs-August 10, 2024 AST 70, ALT 77, October 12, 2024 alkaline phosphatase 145, AST 43, ALT 53.  Patient states she drinks alcohol  occasionally about once in 2 months.  She denies taking NSAIDs or Tylenol .  I will refer her to gastroenterology for evaluation.  She has history of sarcoidosis.  She has taken methotrexate  in the past from 2013 till 2019 which was discontinued due to mild elevation in the LFTs.  She is on rosuvastatin.  Medial epicondylitis of both elbows-she continues to have mild intermittent epicondylitis.  Stretching exercise were discussed.  Primary osteoarthritis of both hands-mild PIP and DIP thickening was noted.  She continues to have some stiffness.  Chronic pain of both ankles-resolved.  Age-related osteoporosis without current pathological fracture - DEXA 05/13/2023: AP spine BMD 0.777 with T-score -2.5. -13% decrease Right total femur and -14% decrease in Left total femur.  Recast 08/22/2023, 08/24/2024.  DEXA scan is pending for later this year.  Vitamin D  deficiency-states she has been taking vitamin D  and vitamin D  has been normal.  History of hyperlipidemia-she is on rosuvastatin.  History of hypertension-pressure was normal at 110/77.  Primary insomnia  Low iron  RLS (restless legs syndrome)  Orders: No orders of the defined types were placed in this encounter.  No orders of the defined types were placed in this encounter.    Follow-Up Instructions: Return in about 6 months (around 07/07/2025) for Sarcoidosis, Osteoporosis.   Maya Nash, MD  Note - This record has been created using Animal nutritionist.  Chart creation errors have been sought, but may not always  have been located. Such creation errors do not reflect on  the standard of medical care.     [1]  Social History Tobacco Use   Smoking status: Never    Passive exposure: Never   Smokeless tobacco: Never  Vaping Use   Vaping status: Never Used  Substance Use Topics   Alcohol use: Yes    Comment: social   Drug use: Never   "

## 2025-01-07 ENCOUNTER — Encounter: Payer: Self-pay | Admitting: Rheumatology

## 2025-01-07 ENCOUNTER — Ambulatory Visit: Admitting: Rheumatology

## 2025-01-07 VITALS — BP 110/77 | HR 93 | Temp 97.9°F | Resp 13 | Ht 67.0 in | Wt 171.4 lb

## 2025-01-07 DIAGNOSIS — M25571 Pain in right ankle and joints of right foot: Secondary | ICD-10-CM | POA: Diagnosis not present

## 2025-01-07 DIAGNOSIS — F5101 Primary insomnia: Secondary | ICD-10-CM | POA: Diagnosis not present

## 2025-01-07 DIAGNOSIS — R590 Localized enlarged lymph nodes: Secondary | ICD-10-CM | POA: Diagnosis not present

## 2025-01-07 DIAGNOSIS — R7989 Other specified abnormal findings of blood chemistry: Secondary | ICD-10-CM

## 2025-01-07 DIAGNOSIS — M7702 Medial epicondylitis, left elbow: Secondary | ICD-10-CM

## 2025-01-07 DIAGNOSIS — G2581 Restless legs syndrome: Secondary | ICD-10-CM

## 2025-01-07 DIAGNOSIS — Z79899 Other long term (current) drug therapy: Secondary | ICD-10-CM

## 2025-01-07 DIAGNOSIS — E611 Iron deficiency: Secondary | ICD-10-CM

## 2025-01-07 DIAGNOSIS — Z8679 Personal history of other diseases of the circulatory system: Secondary | ICD-10-CM

## 2025-01-07 DIAGNOSIS — E559 Vitamin D deficiency, unspecified: Secondary | ICD-10-CM | POA: Diagnosis not present

## 2025-01-07 DIAGNOSIS — M25572 Pain in left ankle and joints of left foot: Secondary | ICD-10-CM

## 2025-01-07 DIAGNOSIS — M7701 Medial epicondylitis, right elbow: Secondary | ICD-10-CM | POA: Diagnosis not present

## 2025-01-07 DIAGNOSIS — M810 Age-related osteoporosis without current pathological fracture: Secondary | ICD-10-CM | POA: Diagnosis not present

## 2025-01-07 DIAGNOSIS — M19041 Primary osteoarthritis, right hand: Secondary | ICD-10-CM

## 2025-01-07 DIAGNOSIS — Z8639 Personal history of other endocrine, nutritional and metabolic disease: Secondary | ICD-10-CM | POA: Diagnosis not present

## 2025-01-07 DIAGNOSIS — D860 Sarcoidosis of lung: Secondary | ICD-10-CM

## 2025-01-07 DIAGNOSIS — M19042 Primary osteoarthritis, left hand: Secondary | ICD-10-CM

## 2025-01-07 DIAGNOSIS — M3509 Sicca syndrome with other organ involvement: Secondary | ICD-10-CM

## 2025-01-07 DIAGNOSIS — G8929 Other chronic pain: Secondary | ICD-10-CM

## 2025-07-08 ENCOUNTER — Ambulatory Visit: Admitting: Rheumatology

## 2025-12-21 ENCOUNTER — Ambulatory Visit: Admitting: Adult Health
